# Patient Record
Sex: Male | Born: 1935 | Race: Black or African American | Hispanic: No | Smoking: Never smoker
Health system: Southern US, Community
[De-identification: ages and names within clinical notes are randomized; demographics above are authoritative.]

## PROBLEM LIST (undated history)

## (undated) DIAGNOSIS — R531 Weakness: Secondary | ICD-10-CM

## (undated) DIAGNOSIS — M1990 Unspecified osteoarthritis, unspecified site: Secondary | ICD-10-CM

## (undated) DIAGNOSIS — J189 Pneumonia, unspecified organism: Secondary | ICD-10-CM

## (undated) DIAGNOSIS — R51 Headache: Secondary | ICD-10-CM

## (undated) DIAGNOSIS — D649 Anemia, unspecified: Secondary | ICD-10-CM

## (undated) DIAGNOSIS — I10 Essential (primary) hypertension: Secondary | ICD-10-CM

## (undated) DIAGNOSIS — H6690 Otitis media, unspecified, unspecified ear: Secondary | ICD-10-CM

## (undated) DIAGNOSIS — Z9889 Other specified postprocedural states: Secondary | ICD-10-CM

## (undated) DIAGNOSIS — K5900 Constipation, unspecified: Secondary | ICD-10-CM

## (undated) DIAGNOSIS — R112 Nausea with vomiting, unspecified: Secondary | ICD-10-CM

## (undated) DIAGNOSIS — T8859XA Other complications of anesthesia, initial encounter: Secondary | ICD-10-CM

## (undated) DIAGNOSIS — H409 Unspecified glaucoma: Secondary | ICD-10-CM

## (undated) DIAGNOSIS — T4145XA Adverse effect of unspecified anesthetic, initial encounter: Secondary | ICD-10-CM

## (undated) DIAGNOSIS — Z87442 Personal history of urinary calculi: Secondary | ICD-10-CM

## (undated) DIAGNOSIS — R519 Headache, unspecified: Secondary | ICD-10-CM

## (undated) DIAGNOSIS — R011 Cardiac murmur, unspecified: Secondary | ICD-10-CM

## (undated) HISTORY — PX: OTHER SURGICAL HISTORY: SHX169

## (undated) HISTORY — PX: TONSILLECTOMY: SUR1361

## (undated) HISTORY — DX: Otitis media, unspecified, unspecified ear: H66.90

## (undated) HISTORY — DX: Unspecified osteoarthritis, unspecified site: M19.90

## (undated) HISTORY — DX: Essential (primary) hypertension: I10

## (undated) HISTORY — PX: EYE SURGERY: SHX253

---

## 1997-07-10 ENCOUNTER — Ambulatory Visit (HOSPITAL_COMMUNITY): Admission: RE | Admit: 1997-07-10 | Discharge: 1997-07-10 | Payer: Self-pay | Admitting: Internal Medicine

## 1998-02-27 HISTORY — PX: BACK SURGERY: SHX140

## 1998-03-20 ENCOUNTER — Ambulatory Visit (HOSPITAL_COMMUNITY): Admission: RE | Admit: 1998-03-20 | Discharge: 1998-03-20 | Payer: Self-pay | Admitting: Neurosurgery

## 1998-03-20 ENCOUNTER — Encounter: Payer: Self-pay | Admitting: Neurosurgery

## 1998-05-31 ENCOUNTER — Ambulatory Visit (HOSPITAL_COMMUNITY): Admission: RE | Admit: 1998-05-31 | Discharge: 1998-05-31 | Payer: Self-pay | Admitting: Cardiovascular Disease

## 1998-05-31 ENCOUNTER — Encounter: Payer: Self-pay | Admitting: Cardiovascular Disease

## 1998-06-01 ENCOUNTER — Ambulatory Visit (HOSPITAL_COMMUNITY): Admission: RE | Admit: 1998-06-01 | Discharge: 1998-06-01 | Payer: Self-pay | Admitting: Cardiovascular Disease

## 1998-06-01 ENCOUNTER — Encounter: Payer: Self-pay | Admitting: Neurosurgery

## 1998-06-02 ENCOUNTER — Emergency Department (HOSPITAL_COMMUNITY): Admission: EM | Admit: 1998-06-02 | Discharge: 1998-06-02 | Payer: Self-pay | Admitting: Emergency Medicine

## 1998-07-08 ENCOUNTER — Encounter: Payer: Self-pay | Admitting: Neurosurgery

## 1998-07-08 ENCOUNTER — Inpatient Hospital Stay (HOSPITAL_COMMUNITY): Admission: RE | Admit: 1998-07-08 | Discharge: 1998-07-12 | Payer: Self-pay | Admitting: Neurosurgery

## 2000-03-26 ENCOUNTER — Encounter: Admission: RE | Admit: 2000-03-26 | Discharge: 2000-03-26 | Payer: Self-pay | Admitting: Internal Medicine

## 2000-03-26 ENCOUNTER — Encounter: Payer: Self-pay | Admitting: Internal Medicine

## 2001-01-04 ENCOUNTER — Ambulatory Visit (HOSPITAL_COMMUNITY): Admission: RE | Admit: 2001-01-04 | Discharge: 2001-01-04 | Payer: Self-pay | Admitting: Gastroenterology

## 2003-11-03 ENCOUNTER — Encounter: Admission: RE | Admit: 2003-11-03 | Discharge: 2003-11-03 | Payer: Self-pay | Admitting: Internal Medicine

## 2003-11-04 ENCOUNTER — Emergency Department (HOSPITAL_COMMUNITY): Admission: EM | Admit: 2003-11-04 | Discharge: 2003-11-04 | Payer: Self-pay | Admitting: Emergency Medicine

## 2003-11-19 ENCOUNTER — Encounter: Admission: RE | Admit: 2003-11-19 | Discharge: 2003-11-19 | Payer: Self-pay | Admitting: Internal Medicine

## 2004-01-15 ENCOUNTER — Ambulatory Visit (HOSPITAL_COMMUNITY): Admission: RE | Admit: 2004-01-15 | Discharge: 2004-01-15 | Payer: Self-pay | Admitting: Gastroenterology

## 2006-01-29 ENCOUNTER — Encounter: Admission: RE | Admit: 2006-01-29 | Discharge: 2006-01-29 | Payer: Self-pay | Admitting: Internal Medicine

## 2006-02-15 ENCOUNTER — Encounter: Admission: RE | Admit: 2006-02-15 | Discharge: 2006-02-15 | Payer: Self-pay | Admitting: Internal Medicine

## 2006-03-08 ENCOUNTER — Emergency Department (HOSPITAL_COMMUNITY): Admission: EM | Admit: 2006-03-08 | Discharge: 2006-03-08 | Payer: Self-pay | Admitting: Emergency Medicine

## 2007-11-28 ENCOUNTER — Encounter: Admission: RE | Admit: 2007-11-28 | Discharge: 2007-11-28 | Payer: Self-pay | Admitting: Neurology

## 2007-12-11 ENCOUNTER — Encounter: Admission: RE | Admit: 2007-12-11 | Discharge: 2008-02-27 | Payer: Self-pay | Admitting: Neurology

## 2008-07-07 ENCOUNTER — Encounter: Admission: RE | Admit: 2008-07-07 | Discharge: 2008-07-07 | Payer: Self-pay | Admitting: Internal Medicine

## 2008-10-22 ENCOUNTER — Inpatient Hospital Stay (HOSPITAL_BASED_OUTPATIENT_CLINIC_OR_DEPARTMENT_OTHER): Admission: RE | Admit: 2008-10-22 | Discharge: 2008-10-22 | Payer: Self-pay | Admitting: Cardiovascular Disease

## 2008-11-30 LAB — HM COLONOSCOPY

## 2009-11-19 ENCOUNTER — Encounter: Admission: RE | Admit: 2009-11-19 | Discharge: 2009-11-19 | Payer: Self-pay | Admitting: Internal Medicine

## 2010-07-12 NOTE — Cardiovascular Report (Signed)
NAME:  [name], [name]                ACCOUNT NO.:  [id number]   MEDICAL RECORD NO.:  [id number]          PATIENT TYPE:  OIB   LOCATION:  1963                         FACILITY:  [facility]   PHYSICIAN:  Mohan N. Sharyn Lull, M.D. DATE OF BIRTH:  1935-12-09   DATE OF PROCEDURE:  10/22/2008  DATE OF DISCHARGE:  10/22/2008                            CARDIAC CATHETERIZATION   PROCEDURE:  Left cardiac cath and selective left and right coronary  angiography, LV graphy via right groin using Judkins technique.   INDICATIONS FOR PROCEDURE:  [name] is a 75 year old black male with  past medical history significant for hypertension, benign hypertrophy of  prostate, and glaucoma of the eyes who complains of retrosternal chest  tightness when under stress associated with feeling weak with minimal  exertion associated with diaphoresis.  Denies any palpitation,  lightheadedness, or syncope.  Denies PND, orthopnea, and leg swelling.  EKG done in the office showed sinus bradycardia with LVH with T-wave  inversion in lateral leads.  The patient denies any relation of chest  pain to food, breathing, or movement.   PAST MEDICAL HISTORY:  As above.   PAST SURGICAL HISTORY:  He had back surgery in the past, had  tonsillectomy in the past, had glaucoma surgeries of the eyes and right  wrist surgery.   SOCIAL HISTORY:  He is divorced and has 3 children.  No history of  smoking or alcohol abuse.  He is retired, worked for a [employer] in  the past.   FAMILY HISTORY:  Noncontributory.   MEDICATIONS AT HOME:  He is on:  1. Enteric-coated aspirin 81 mg p.o. daily.  2. Zestoretic 20/25 p.o. daily.  3. Proscar 5 mg p.o. daily.  4. Atenolol 50 mg p.o. daily.  5. Doxazosin 8 mg p.o. daily.  6. Zyrtec 10 mg p.o. daily.   ALLERGIES:  He is allergic to [facility], he has blurring of the vision.   PHYSICAL EXAMINATION:  GENERAL:  He is alert, awake, and oriented x3 in  no acute distress.  VITAL SIGNS:  Blood  pressure was 140/70, pulse was 51 and regular.  HEENT:  Conjunctivae were pink.  NECK:  Supple.  No JVD.  No bruit.  LUNGS:  Clear to auscultation without rhonchi or rales.  CARDIOVASCULAR:  S1 and S2 was normal.  There was a soft systolic  murmur.  There was no S3 or S4 gallop.  ABDOMEN:  Soft.  Bowel sounds present.  Nontender.  EXTREMITIES:  There is no clubbing, cyanosis, or edema.   IMPRESSION:  1. Chest pain and weakness, rule out coronary insufficiency.  2. Hypertension.  3. Benign hypertrophy of prostate.  4. Degenerative joint disease.  5. Hypercholesteremia.  6. History of glaucoma of the eyes.   I discussed with the patient regarding left cath, its risks and benefits  i.e., death, MI, stroke, local vascular complications, etc. and  consented for the left cath.   PROCEDURE:  After obtaining the informed consent, the patient was  brought to the cath lab and was placed on fluoroscopy table.  Right  groin was prepped and draped  in usual fashion.  Xylocaine 1% was used  for local anesthesia in the right groin.  With the help of thin-wall  needle, a 4-French arterial sheath was placed.  The sheath was aspirated  and flushed.  Next, 4-French left Judkins catheter was advanced over the  wire under fluoroscopic guidance up to the ascending aorta.  Wire was  pulled out, the catheter was aspirated, and connected to the manifold.  Catheter was further advanced and engaged into left coronary ostium.  Multiple views of the left system were taken.  Next, catheter was  disengaged and was pulled out over the wire and was replaced with 4-  [place] right 3-D diagnostic catheter which was advanced over the wire  under fluoroscopic guidance into the ascending aorta.  Wire was pulled  out, the catheter was aspirated, and connected to the manifold.  Catheter was further advanced and engaged into right coronary ostium.  Multiple views of the right system were taken.  Next, the catheter was   disengaged and was pulled out over the wire and was replaced with 4-  [place] pigtail catheter which was advanced over the wire under  fluoroscopic guidance into the ascending aorta.  Catheter was further  advanced across the aortic valve into the LV and LV pressures were  recorded.  Next, LV graphy was done in 30-degree RAO position.  Post-  angiographic pressures were recorded from LV and then pullback pressures  were recorded from the aorta.  There was no gradient across the aortic  valve.  Next, the pigtail catheter was pulled out over the wire.  Sheaths were aspirated and flushed.   FINDINGS:  LV showed good LV systolic function, LVH, EF of 04-54%, left  main was long which was patent.  LAD had 20-25% ostial and proximal  stenosis.  Diagonal 1 was small which has 20-30% ostial stenosis.  Diagonal 2 to diagonal 4 were very very small.  Left circumflex with 20-  25% ostial stenosis and then it tapers down in AV groove after giving  off large OM1.  OM1 is large which has 15-20% proximal stenosis.  RCA is  patent.  PDA is very small.  PLV branch is patent.  The patient  tolerated the procedure well.  There were no complications.  The patient  was transferred to recovery room in stable condition.      Eduardo Osier. Sharyn Lull, M.D.  Electronically Signed     MNH/MEDQ  D:  10/22/2008  T:  10/22/2008  Job:  [id number]

## 2010-07-15 NOTE — Procedures (Signed)
[facility]  Patient:    [name], [name] Visit Number: [id number] MRN: [id number]          Service Type: END Location: ENDO Attending Physician:  Orland Mustard Dictated by:   Llana Aliment. Randa Evens, M.D. Proc. Date: 01/04/01 Admit Date:  01/04/2001 Discharge Date: 01/04/2001   CC:         Minerva Areola L. August Saucer, M.D.   Procedure Report  PROCEDURE:  Colonoscopy with removal of polyps.  SURGEON:  James L. Edwards, M.D.  MEDICATIONS:  Fentanyl 87.5 mcg and Versed 8 mg IV.  INDICATIONS:  Strong family history of colon cancer in a brother.  DESCRIPTION OF PROCEDURE:  The procedure had been explained to the patient and consent obtained.  With the patient in the left lateral decubitus position, digital exam was performed.  The adult Olympus video colonoscope was inserted and advanced under direct visualization.  The prep was good. The patient had diffuse melanosis coli throughout the entire colon.  The cecum was reached, and the appendiceal orifice and ileocecal valve were seen.  The scope was withdrawn, and the cecum, ascending colon, hepatic flexure, transverse colon, splenic flexure, descending, and sigmoid colon were seen well upon removal. In the descending colon were two 3-4 mm sessile polyps that were cauterized and placed in a single jar.  No other polyps were seen in the sigmoid colon and rectum.  The scope was withdrawn.  The patient tolerated the procedure well.  ASSESSMENT:  Two polyps in the descending colon.  PLAN: 1. Will recommend repeating in three years. 2. Routine post polypectomy instructions. Dictated by:   Llana Aliment. Randa Evens, M.D. Attending Physician:  Orland Mustard DD:  01/04/01 TD:  01/06/01 Job: [id number]

## 2010-07-15 NOTE — Op Note (Signed)
NAME:  [name], [name]                ACCOUNT NO.:  [id number]   MEDICAL RECORD NO.:  [id number]          PATIENT TYPE:  AMB   LOCATION:  ENDO                         FACILITY:  [facility]   PHYSICIAN:  James L. Malon Kindle., M.D.DATE OF BIRTH:  May 22, 1935   DATE OF PROCEDURE:  01/15/2004  DATE OF DISCHARGE:                                 OPERATIVE REPORT   PROCEDURE:  Colonoscopy.   MEDICATIONS:  Fentanyl 50 mcg, Versed 5 mg IV.   SCOPE:  Olympus pediatric adjustable colonoscope.   INDICATIONS:  Family history of colon cancer in a brother.   DESCRIPTION OF PROCEDURE:  Procedure had been explained to the patient and  consent obtained.  In the left lateral decubitus position, the Olympus scope  was inserted and advanced.  The prep was excellent.  We were able to reach  the cecum without difficulty.  The ileocecal valve and appendiceal orifice  were seen.  The scope was withdrawn to the cecum, ascending colon,  transverse colon, descending and sigmoid colon were seen well and no polyps  or other lesions were seen.  There was no significant diverticular disease.  The scope was withdrawn.  The patient tolerated the procedure well.   ASSESSMENT:  Family history of colon cancer with negative colonoscopy at  this time. V16.0.  Will repeat procedure in 5 years and recommend yearly  Hemoccults.       JLE/MEDQ  D:  01/15/2004  T:  01/16/2004  Job:  [id number]   cc:   Minerva Areola L. August Saucer, M.D.  [address] [address]  [place]  [place] [address]  Fax: [phone]

## 2010-09-26 ENCOUNTER — Ambulatory Visit
Admission: RE | Admit: 2010-09-26 | Discharge: 2010-09-26 | Disposition: A | Discharge status: Home / Self Care | Payer: Medicare Other | Source: Ambulatory Visit | Attending: Internal Medicine | Admitting: Internal Medicine

## 2010-09-26 ENCOUNTER — Other Ambulatory Visit: Payer: Self-pay | Admitting: Internal Medicine

## 2010-09-26 DIAGNOSIS — R059 Cough, unspecified: Secondary | ICD-10-CM

## 2010-09-26 DIAGNOSIS — R05 Cough: Secondary | ICD-10-CM

## 2011-04-18 DIAGNOSIS — H02409 Unspecified ptosis of unspecified eyelid: Secondary | ICD-10-CM | POA: Insufficient documentation

## 2012-03-29 ENCOUNTER — Encounter (HOSPITAL_COMMUNITY): Payer: Self-pay

## 2012-03-29 DIAGNOSIS — H409 Unspecified glaucoma: Secondary | ICD-10-CM

## 2012-03-29 DIAGNOSIS — R0609 Other forms of dyspnea: Secondary | ICD-10-CM

## 2012-03-29 DIAGNOSIS — M1990 Unspecified osteoarthritis, unspecified site: Secondary | ICD-10-CM | POA: Insufficient documentation

## 2012-03-29 DIAGNOSIS — J309 Allergic rhinitis, unspecified: Secondary | ICD-10-CM

## 2012-03-29 DIAGNOSIS — I10 Essential (primary) hypertension: Secondary | ICD-10-CM | POA: Insufficient documentation

## 2012-03-29 DIAGNOSIS — L739 Follicular disorder, unspecified: Secondary | ICD-10-CM | POA: Insufficient documentation

## 2012-05-14 ENCOUNTER — Other Ambulatory Visit (HOSPITAL_COMMUNITY): Payer: Self-pay | Admitting: Cardiology

## 2012-05-14 DIAGNOSIS — R079 Chest pain, unspecified: Secondary | ICD-10-CM

## 2012-05-24 ENCOUNTER — Encounter (HOSPITAL_COMMUNITY)
Admission: RE | Admit: 2012-05-24 | Discharge: 2012-05-24 | Disposition: A | Discharge status: Home / Self Care | Payer: Medicare Other | Source: Ambulatory Visit | Attending: Cardiology | Admitting: Cardiology

## 2012-05-24 ENCOUNTER — Other Ambulatory Visit: Payer: Self-pay

## 2012-05-24 DIAGNOSIS — R079 Chest pain, unspecified: Secondary | ICD-10-CM

## 2012-05-24 MED ORDER — TECHNETIUM TC 99M SESTAMIBI GENERIC - CARDIOLITE
10.0000 | Freq: Once | INTRAVENOUS | Status: AC | PRN
  Administered 2012-05-24: 10 via INTRAVENOUS

## 2012-05-24 MED ORDER — REGADENOSON 0.4 MG/5ML IV SOLN
INTRAVENOUS | Status: AC
  Filled 2012-05-24: qty 5

## 2012-05-24 MED ORDER — TECHNETIUM TC 99M SESTAMIBI GENERIC - CARDIOLITE
30.0000 | Freq: Once | INTRAVENOUS | Status: AC | PRN
  Administered 2012-05-24: 30 via INTRAVENOUS

## 2012-05-24 MED ORDER — REGADENOSON 0.4 MG/5ML IV SOLN
0.4000 mg | Freq: Once | INTRAVENOUS | Status: AC
  Administered 2012-05-24: 0.4 mg via INTRAVENOUS

## 2012-07-18 ENCOUNTER — Encounter (HOSPITAL_COMMUNITY): Payer: Self-pay | Admitting: Pharmacy Technician

## 2012-07-23 ENCOUNTER — Other Ambulatory Visit: Payer: Self-pay | Admitting: Orthopedic Surgery

## 2012-07-23 MED ORDER — BUPIVACAINE LIPOSOME 1.3 % IJ SUSP: 20.0000 mL | Freq: Once | INTRAMUSCULAR | Status: DC

## 2012-07-23 MED ORDER — DEXAMETHASONE SODIUM PHOSPHATE 10 MG/ML IJ SOLN: 10.0000 mg | Freq: Once | INTRAMUSCULAR | Status: DC

## 2012-07-23 NOTE — Progress Notes (Signed)
Preoperative surgical orders have been place into the Epic hospital system for [name] on 07/23/2012, 7:49 AM  by Patrica Duel for surgery on 08/05/2012.  Preop Total Knee orders including Experal, IV Tylenol, and IV Decadron as long as there are no contraindications to the above medications. Avel Peace, PA-C

## 2012-07-29 ENCOUNTER — Ambulatory Visit (HOSPITAL_COMMUNITY)
Admission: RE | Admit: 2012-07-29 | Discharge: 2012-07-29 | Disposition: A | Discharge status: Home / Self Care | Payer: Medicare Other | Source: Ambulatory Visit | Attending: Orthopedic Surgery | Admitting: Orthopedic Surgery

## 2012-07-29 ENCOUNTER — Encounter (HOSPITAL_COMMUNITY): Payer: Self-pay

## 2012-07-29 ENCOUNTER — Encounter (HOSPITAL_COMMUNITY)
Admission: RE | Admit: 2012-07-29 | Discharge: 2012-07-29 | Disposition: A | Discharge status: Home / Self Care | Payer: Medicare Other | Source: Ambulatory Visit | Attending: Orthopedic Surgery | Admitting: Orthopedic Surgery

## 2012-07-29 DIAGNOSIS — Z01812 Encounter for preprocedural laboratory examination: Secondary | ICD-10-CM | POA: Insufficient documentation

## 2012-07-29 DIAGNOSIS — I10 Essential (primary) hypertension: Secondary | ICD-10-CM | POA: Insufficient documentation

## 2012-07-29 DIAGNOSIS — I517 Cardiomegaly: Secondary | ICD-10-CM | POA: Insufficient documentation

## 2012-07-29 HISTORY — DX: Adverse effect of unspecified anesthetic, initial encounter: T41.45XA

## 2012-07-29 HISTORY — DX: Constipation, unspecified: K59.00

## 2012-07-29 HISTORY — DX: Other complications of anesthesia, initial encounter: T88.59XA

## 2012-07-29 HISTORY — DX: Unspecified glaucoma: H40.9

## 2012-07-29 HISTORY — DX: Weakness: R53.1

## 2012-07-29 LAB — COMPREHENSIVE METABOLIC PANEL
AST: 24 U/L (ref 0–37)
BUN: 9 mg/dL (ref 6–23)
CO2: 30 mEq/L (ref 19–32)
Calcium: 9.6 mg/dL (ref 8.4–10.5)
Chloride: 103 mEq/L (ref 96–112)
Creatinine, Ser: 1.09 mg/dL (ref 0.50–1.35)
GFR calc Af Amer: 75 mL/min — ABNORMAL LOW (ref >90)
GFR calc non Af Amer: 64 mL/min — ABNORMAL LOW (ref >90)
Glucose, Bld: 85 mg/dL (ref 70–99)
Total Bilirubin: 0.5 mg/dL (ref 0.3–1.2)

## 2012-07-29 LAB — URINALYSIS, ROUTINE W REFLEX MICROSCOPIC
Glucose, UA: NEGATIVE mg/dL
Hgb urine dipstick: NEGATIVE
Ketones, ur: NEGATIVE mg/dL
Leukocytes, UA: NEGATIVE
pH: 6.5 (ref 5.0–8.0)

## 2012-07-29 LAB — CBC
HCT: 38.7 % — ABNORMAL LOW (ref 39.0–52.0)
MCH: 31.9 pg (ref 26.0–34.0)
MCV: 88.8 fL (ref 78.0–100.0)
RBC: 4.36 MIL/uL (ref 4.22–5.81)
WBC: 4.4 10*3/uL (ref 4.0–10.5)

## 2012-07-29 LAB — PROTIME-INR
INR: 1.2 (ref 0.00–1.49)
Prothrombin Time: 15 seconds (ref 11.6–15.2)

## 2012-07-29 LAB — ABO/RH: ABO/RH(D): B POS

## 2012-07-29 NOTE — Progress Notes (Signed)
07/29/12 1435  OBSTRUCTIVE SLEEP APNEA  Have you ever been diagnosed with sleep apnea through a sleep study? No  Do you snore loudly (loud enough to be heard through closed doors)?  1  Do you often feel tired, fatigued, or sleepy during the daytime? 0  Has anyone observed you stop breathing during your sleep? 0  Do you have, or are you being treated for high blood pressure? 1  BMI more than 35 kg/m2? 0  Age over 75 years old? 1  Neck circumference greater than 40 cm/18 inches? 0  Gender: 1  Obstructive Sleep Apnea Score 4  Score 4 or greater  Results sent to PCP

## 2012-07-29 NOTE — Patient Instructions (Addendum)
[name]  07/29/2012                           YOUR PROCEDURE IS SCHEDULED ON: 08/05/12               PLEASE REPORT TO SHORT STAY CENTER AT :  10:45 AM               CALL THIS NUMBER IF ANY PROBLEMS THE DAY OF SURGERY :               [phone]                      REMEMBER:   Do not eat food or drink liquids AFTER MIDNIGHT  MAY HAVE CLEAR LIQUIDS ONLY FROM 12:00 AM TO 7:45 AM THEN NOTHING  Take these medicines the morning of surgery with A SIP OF WATER:  ATENOLOL / USE EYE DROPS AS YOU NORMALLY USE   Do not wear jewelry, make-up   Do not wear lotions, powders, or perfumes.   Do not shave legs or underarms 12 hrs. before surgery (men may shave face)  Do not bring valuables to the hospital.  Contacts, dentures or bridgework may not be worn into surgery.  Leave suitcase in the car. After surgery it may be brought to your room.  For patients admitted to the hospital more than one night, checkout time is 11:00                          The day of discharge.   Patients discharged the day of surgery will not be allowed to drive home                             If going home same day of surgery, must have someone stay with you first                           24 hrs at home and arrange for some one to drive you home from hospital.    Special Instructions:   Please read over the following fact sheets that you were given:               1. MRSA  INFORMATION                      2. [facility] PREPARING FOR SURGERY SHEET               3. INCENTIVE SPIROMETER                                                X_____________________________________________________________________        Failure to follow these instructions may result in cancellation of your surgery

## 2012-07-30 ENCOUNTER — Other Ambulatory Visit: Payer: Self-pay | Admitting: Orthopedic Surgery

## 2012-07-30 NOTE — Progress Notes (Signed)
Abnormal CXR faxed to Dr. Lequita Halt

## 2012-08-04 ENCOUNTER — Other Ambulatory Visit: Payer: Self-pay | Admitting: Orthopedic Surgery

## 2012-08-04 NOTE — H&P (Signed)
[name]  DOB: April 06, 1935 Widowed / Language: [place] / Race: Black or African American, White Male  Date of Admission:  08/05/2012  Chief Complaint:  Left Knee Pain  History of Present Illness The patient is a 77 year old male who comes in for a preoperative History and Physical. The patient is scheduled for a left total knee arthroplasty to be performed by Dr. Gus Rankin. Aluisio, MD at [facility] on 08/05/2012. The patient is a 77 year old male who presents with knee complaints. The patient reports left knee and right knee symptoms including: instability (shift inward) and weakness which began year(s) ago without any known injury. The patient describes the severity of the symptoms as mild.The patient feels that the symptoms are worsening. and include difficulty ambulating. The patient reports that symptoms radiate to the right hip. Note for "Knee pain": last cortisone injection: 09/13/11 The left knee is getting progressively worse. He said the knees are bowing so badly that the inner thighs are rubbing against each other. He has significant valgus deformities of both knees. He is not having a tremendous amount of pain but the left knee especially is giving out on him a lot. He has had several near falls.  He is ready to proceed with surgery at this time. They have been treated conservatively in the past for the above stated problem and despite conservative measures, they continue to have progressive pain and severe functional limitations and dysfunction. They have failed non-operative management including home exercise, medications, and injections. It is felt that they would benefit from undergoing total joint replacement. Risks and benefits of the procedure have been discussed with the patient and they elect to proceed with surgery. There are no active contraindications to surgery such as ongoing infection or rapidly progressive neurological disease.  Problem  List Osteoarthritis, knee (715.96)   Allergies Hyoscamine *ULCER DRUGS*. causes patient to pass out CeleBREX *ANALGESICS - ANTI-INFLAMMATORY*. causes hypertension Dye FDC Red 40 (Carmine Red) *PHARMACEUTICAL ADJUVANTS* Flomax *GENITOURINARY AGENTS - MISCELLANEOUS* Bextra *ANALGESICS - ANTI-INFLAMMATORY*   Family History  Cancer. sister and brother Diabetes Mellitus. brother Hypertension. sister and brother Bleeding disorder. brother Osteoarthritis. 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 Exercises daily; does other Alcohol use. never consumed alcohol Children. 3 Illicit drug use. no Tobacco / smoke exposure. yes outdoors only Tobacco use. never smoker Living situation. live alone Marital status. widowed Pain Contract. no Post-Surgical Plans. Plan is to go home following the surgery.   Medication History Zestoretic (20-25MG  Tablet, Oral) Active. Lisinopril-Hydrochlorothiazide (20-25MG  Tablet, Oral) Active. Saw Palmetto Extract ( Oral) Specific dose unknown - Active. Timolol Maleate ( Ophthalmic) Specific dose unknown - Active. Durezol (0.05% Emulsion, Ophthalmic) Active. Aspirin EC (81MG  Tablet DR, Oral) Active. Atenolol (50MG  Tablet, Oral) Active. ZyrTEC Allergy (10MG  Tablet, Oral) Active. Vitamin D (1000UNIT Tablet, Oral) Active. Doxazosin Mesylate (8MG  Tablet, Oral) Active. Famciclovir (500MG  Tablet, Oral) Active. Finasteride (5MG  Tablet, Oral) Active. BAC+ Active. (supplement for artery health) EVERY MAN 2 MVI Active. EYE BRIGHT Active. JOINT AID Active. MUSCLE CREATIVE DIMAXX Active. XANGO JUICE Active.   Past Surgical History Cataract Surgery. bilateral Spinal Surgery Eye Surgery  Medical History Impaired Vision Tinnitus Macular Degeneration Glaucoma Cataract Dentures High blood pressure Osteoarthritis   Review of Systems General:Not Present- Chills,  Fever, Night Sweats, Fatigue, Weight Gain, Weight Loss and Memory Loss. Skin:Not Present- Hives, Itching, Rash, Eczema and Lesions. HEENT:Present- Blurred Vision (recent eye surgery) and Dentures. Not Present- Tinnitus, Headache,  Double Vision, Visual Loss and Hearing Loss. Respiratory:Not Present- Shortness of breath with exertion, Shortness of breath at rest, Allergies, Coughing up blood and Chronic Cough. Cardiovascular:Not Present- Chest Pain, Racing/skipping heartbeats, Difficulty Breathing Lying Down, Murmur, Swelling and Palpitations. Gastrointestinal:Not Present- Bloody Stool, Heartburn, Abdominal Pain, Vomiting, Nausea, Constipation, Diarrhea, Difficulty Swallowing, Jaundice and Loss of appetitie. Male Genitourinary:Not Present- Urinary frequency, Blood in Urine, Weak urinary stream, Discharge, Flank Pain, Incontinence, Painful Urination, Urgency, Urinary Retention and Urinating at Night. Musculoskeletal:Present- Muscle Weakness and Joint Pain. Not Present- Muscle Pain, Joint Swelling, Back Pain, Morning Stiffness and Spasms. Neurological:Not Present- Tremor, Dizziness, Blackout spells, Paralysis, Difficulty with balance and Weakness. Psychiatric:Not Present- Insomnia.   Vitals Weight: 222 lb Height: 75 in Weight was reported by patient. Height was reported by patient. Body Surface Area: 2.31 m Body Mass Index: 27.75 kg/m Pulse: 76 (Regular) Resp.: 14 (Unlabored) BP: 152/66 (Sitting, Left Arm, Standard)    Physical Exam The physical exam findings are as follows:  Note: Patient is a 77 year old male with continued knee pain.   General Mental Status - Alert, cooperative and good historian. General Appearance- pleasant. Not in acute distress. Orientation- Oriented X3. Build & Nutrition- Well nourished and Well developed.   Head and Neck Head- normocephalic, atraumatic . Neck Global Assessment- supple. no bruit auscultated on the right and no  bruit auscultated on the left.   Eye Vision- Wears contact lenses (Left eye only). Pupil- Bilateral- Regular and Round. Motion- Bilateral- EOMI.   Note: Left Eye is injected and erythematous from recent surgery.  Chest and Lung Exam Auscultation: Breath sounds:- clear at anterior chest wall and - clear at posterior chest wall. Adventitious sounds:- No Adventitious sounds.   Cardiovascular Auscultation:Rhythm- Regular rate and rhythm. Heart Sounds- S1 WNL and S2 WNL. Murmurs & Other Heart Sounds:Auscultation of the heart reveals - No Murmurs.   Abdomen Palpation/Percussion:Tenderness- Abdomen is non-tender to palpation. Rigidity (guarding)- Abdomen is soft. Auscultation:Auscultation of the abdomen reveals - Bowel sounds normal.   Male Genitourinary Not done, not pertinent to present illness  Musculoskeletal On exam he is alert and oriented in no apparent distress. Evaluation of both knees shows valgus deformity worse on the left than the right. His range of motion on the left is about 5 to 125 with marked crepitus on range of motion. He has about 25 degree valgus deformity while standing.    RADIOGRAPHS: X-rays shows that the arthritis is bone on bone tricompartmental in nature on the left worse in the lateral compartment and he has lateral and patellofemoral disease on the right.  Assessment & Plan Osteoarthritis, knee (715.96) Impression: Left Knee  Note: Plan is for a Left Total Knee Replacement by Dr. Lequita Halt.  Plan is to go home.  The patient does not have any contraindications and will recieve TXA (tranexamic acid) prior to surgery.  PCP - Dr. Willey Blade - Patient has been seen preoperatively and felt to be stable for surgery. Cardiology - Dr. Sharyn Lull - Patient has been seen preoperatively and felt to be stable for surgery.  Signed electronically by Lauraine Rinne, III PA-C

## 2012-08-05 ENCOUNTER — Inpatient Hospital Stay (HOSPITAL_COMMUNITY)
Admission: RE | Admit: 2012-08-05 | Discharge: 2012-08-09 | DRG: 470 | Disposition: A | Discharge status: Home Health | Payer: Medicare Other | Source: Ambulatory Visit | Attending: Orthopedic Surgery | Admitting: Orthopedic Surgery

## 2012-08-05 ENCOUNTER — Encounter (HOSPITAL_COMMUNITY)
Admission: RE | Disposition: A | Discharge status: Home Health | Payer: Self-pay | Source: Ambulatory Visit | Attending: Orthopedic Surgery

## 2012-08-05 ENCOUNTER — Inpatient Hospital Stay (HOSPITAL_COMMUNITY): Payer: Medicare Other | Admitting: Anesthesiology

## 2012-08-05 ENCOUNTER — Encounter (HOSPITAL_COMMUNITY): Payer: Self-pay | Admitting: *Deleted

## 2012-08-05 ENCOUNTER — Encounter (HOSPITAL_COMMUNITY): Payer: Self-pay | Admitting: Anesthesiology

## 2012-08-05 DIAGNOSIS — H409 Unspecified glaucoma: Secondary | ICD-10-CM | POA: Diagnosis present

## 2012-08-05 DIAGNOSIS — M179 Osteoarthritis of knee, unspecified: Secondary | ICD-10-CM | POA: Diagnosis present

## 2012-08-05 DIAGNOSIS — H3530 Unspecified macular degeneration: Secondary | ICD-10-CM | POA: Diagnosis present

## 2012-08-05 DIAGNOSIS — M1710 Unilateral primary osteoarthritis, unspecified knee: Principal | ICD-10-CM | POA: Diagnosis present

## 2012-08-05 DIAGNOSIS — K5900 Constipation, unspecified: Secondary | ICD-10-CM | POA: Diagnosis present

## 2012-08-05 DIAGNOSIS — H9319 Tinnitus, unspecified ear: Secondary | ICD-10-CM | POA: Diagnosis present

## 2012-08-05 DIAGNOSIS — D62 Acute posthemorrhagic anemia: Secondary | ICD-10-CM

## 2012-08-05 DIAGNOSIS — I10 Essential (primary) hypertension: Secondary | ICD-10-CM | POA: Diagnosis present

## 2012-08-05 DIAGNOSIS — Z96652 Presence of left artificial knee joint: Secondary | ICD-10-CM

## 2012-08-05 HISTORY — PX: TOTAL KNEE ARTHROPLASTY: SHX125

## 2012-08-05 SURGERY — ARTHROPLASTY, KNEE, TOTAL
Anesthesia: General | Site: Knee | Laterality: Left | Wound class: Clean

## 2012-08-05 MED ORDER — KETOROLAC TROMETHAMINE 0.4 % OP SOLN
1.0000 [drp] | Freq: Two times a day (BID) | OPHTHALMIC | Status: DC
  Filled 2012-08-05 (×17): qty 0.1

## 2012-08-05 MED ORDER — ATROPINE SULFATE 1 % OP SOLN
1.0000 [drp] | Freq: Two times a day (BID) | OPHTHALMIC | Status: DC
  Administered 2012-08-05 – 2012-08-06 (×3): 1 [drp] via OPHTHALMIC
  Filled 2012-08-05: qty 2

## 2012-08-05 MED ORDER — OXYCODONE HCL 5 MG PO TABS: 5.0000 mg | ORAL_TABLET | Freq: Once | ORAL | Status: DC | PRN

## 2012-08-05 MED ORDER — TRANEXAMIC ACID 100 MG/ML IV SOLN
1000.0000 mg | INTRAVENOUS | Status: AC
  Administered 2012-08-05: 1000 mg via INTRAVENOUS
  Filled 2012-08-05: qty 10

## 2012-08-05 MED ORDER — FLEET ENEMA 7-19 GM/118ML RE ENEM: 1.0000 | ENEMA | Freq: Once | RECTAL | Status: AC | PRN

## 2012-08-05 MED ORDER — METOCLOPRAMIDE HCL 5 MG/ML IJ SOLN
5.0000 mg | Freq: Three times a day (TID) | INTRAMUSCULAR | Status: DC | PRN
  Administered 2012-08-05: 10 mg via INTRAVENOUS
  Filled 2012-08-05: qty 2

## 2012-08-05 MED ORDER — CLONIDINE HCL 0.1 MG PO TABS
0.1000 mg | ORAL_TABLET | Freq: Once | ORAL | Status: AC
  Administered 2012-08-05: 0.1 mg via ORAL
  Filled 2012-08-05: qty 1

## 2012-08-05 MED ORDER — DEXAMETHASONE SODIUM PHOSPHATE 10 MG/ML IJ SOLN
10.0000 mg | Freq: Every day | INTRAMUSCULAR | Status: AC
  Filled 2012-08-05: qty 1

## 2012-08-05 MED ORDER — OXYCODONE HCL 5 MG/5ML PO SOLN: 5.0000 mg | Freq: Once | ORAL | Status: DC | PRN

## 2012-08-05 MED ORDER — DORZOLAMIDE HCL-TIMOLOL MAL 2-0.5 % OP SOLN
1.0000 [drp] | Freq: Two times a day (BID) | OPHTHALMIC | Status: DC
  Filled 2012-08-05: qty 10

## 2012-08-05 MED ORDER — DOCUSATE SODIUM 100 MG PO CAPS
100.0000 mg | ORAL_CAPSULE | Freq: Two times a day (BID) | ORAL | Status: DC
  Administered 2012-08-05 – 2012-08-09 (×8): 100 mg via ORAL

## 2012-08-05 MED ORDER — POLYETHYLENE GLYCOL 3350 17 G PO PACK: 17.0000 g | PACK | Freq: Every day | ORAL | Status: DC | PRN

## 2012-08-05 MED ORDER — GLYCOPYRROLATE 0.2 MG/ML IJ SOLN
INTRAMUSCULAR | Status: DC | PRN
  Administered 2012-08-05: 0.6 mg via INTRAVENOUS

## 2012-08-05 MED ORDER — DIFLUPREDNATE 0.05 % OP EMUL
1.0000 [drp] | Freq: Two times a day (BID) | OPHTHALMIC | Status: DC
  Administered 2012-08-05 – 2012-08-09 (×7): 1 [drp] via OPHTHALMIC

## 2012-08-05 MED ORDER — PROMETHAZINE HCL 25 MG/ML IJ SOLN: 6.2500 mg | INTRAMUSCULAR | Status: DC | PRN

## 2012-08-05 MED ORDER — CHLORHEXIDINE GLUCONATE 4 % EX LIQD
60.0000 mL | Freq: Once | CUTANEOUS | Status: DC
  Filled 2012-08-05: qty 60

## 2012-08-05 MED ORDER — SODIUM CHLORIDE 0.9 % IR SOLN
Status: DC | PRN
  Administered 2012-08-05: 1000 mL

## 2012-08-05 MED ORDER — ONDANSETRON HCL 4 MG/2ML IJ SOLN: 4.0000 mg | Freq: Four times a day (QID) | INTRAMUSCULAR | Status: DC | PRN

## 2012-08-05 MED ORDER — HYDRALAZINE HCL 20 MG/ML IJ SOLN: 5.0000 mg | INTRAMUSCULAR | Status: DC | PRN

## 2012-08-05 MED ORDER — ACETAMINOPHEN 325 MG PO TABS
650.0000 mg | ORAL_TABLET | Freq: Four times a day (QID) | ORAL | Status: DC | PRN
  Administered 2012-08-07 – 2012-08-08 (×2): 650 mg via ORAL
  Filled 2012-08-05 (×2): qty 2

## 2012-08-05 MED ORDER — METHOCARBAMOL 500 MG PO TABS
500.0000 mg | ORAL_TABLET | Freq: Four times a day (QID) | ORAL | Status: DC | PRN
  Administered 2012-08-06: 500 mg via ORAL
  Filled 2012-08-05: qty 1

## 2012-08-05 MED ORDER — ACETAMINOPHEN 650 MG RE SUPP: 650.0000 mg | Freq: Four times a day (QID) | RECTAL | Status: DC | PRN

## 2012-08-05 MED ORDER — ROCURONIUM BROMIDE 100 MG/10ML IV SOLN
INTRAVENOUS | Status: DC | PRN
  Administered 2012-08-05: 30 mg via INTRAVENOUS

## 2012-08-05 MED ORDER — NEOSTIGMINE METHYLSULFATE 1 MG/ML IJ SOLN
INTRAMUSCULAR | Status: DC | PRN
  Administered 2012-08-05: 4 mg via INTRAVENOUS

## 2012-08-05 MED ORDER — MORPHINE SULFATE 2 MG/ML IJ SOLN
1.0000 mg | INTRAMUSCULAR | Status: DC | PRN
  Administered 2012-08-05 – 2012-08-06 (×4): 2 mg via INTRAVENOUS
  Filled 2012-08-05 (×4): qty 1

## 2012-08-05 MED ORDER — ACETAMINOPHEN 10 MG/ML IV SOLN: 1000.0000 mg | Freq: Once | INTRAVENOUS | Status: DC

## 2012-08-05 MED ORDER — ONDANSETRON HCL 4 MG PO TABS: 4.0000 mg | ORAL_TABLET | Freq: Four times a day (QID) | ORAL | Status: DC | PRN

## 2012-08-05 MED ORDER — STERILE WATER FOR IRRIGATION IR SOLN
Status: DC | PRN
  Administered 2012-08-05: 3000 mL

## 2012-08-05 MED ORDER — DOXAZOSIN MESYLATE 8 MG PO TABS
8.0000 mg | ORAL_TABLET | Freq: Every day | ORAL | Status: DC
  Administered 2012-08-05 – 2012-08-08 (×4): 8 mg via ORAL
  Filled 2012-08-05 (×5): qty 1

## 2012-08-05 MED ORDER — BISACODYL 10 MG RE SUPP
10.0000 mg | Freq: Every day | RECTAL | Status: DC | PRN
  Filled 2012-08-05: qty 1

## 2012-08-05 MED ORDER — PROPOFOL 10 MG/ML IV BOLUS
INTRAVENOUS | Status: DC | PRN
  Administered 2012-08-05: 150 mg via INTRAVENOUS

## 2012-08-05 MED ORDER — BIMATOPROST 0.01 % OP SOLN
1.0000 [drp] | Freq: Every day | OPHTHALMIC | Status: DC
  Administered 2012-08-05 – 2012-08-08 (×4): 1 [drp] via OPHTHALMIC
  Filled 2012-08-05: qty 2.5

## 2012-08-05 MED ORDER — BIMATOPROST 0.01 % OP SOLN
1.0000 [drp] | Freq: Every day | OPHTHALMIC | Status: DC
  Filled 2012-08-05: qty 2.5

## 2012-08-05 MED ORDER — METHOCARBAMOL 100 MG/ML IJ SOLN
500.0000 mg | Freq: Four times a day (QID) | INTRAMUSCULAR | Status: DC | PRN
  Filled 2012-08-05: qty 5

## 2012-08-05 MED ORDER — PHENOL 1.4 % MT LIQD
1.0000 | OROMUCOSAL | Status: DC | PRN
  Filled 2012-08-05: qty 177

## 2012-08-05 MED ORDER — PREDNISOLONE ACETATE 1 % OP SUSP
1.0000 [drp] | Freq: Two times a day (BID) | OPHTHALMIC | Status: DC
  Administered 2012-08-06 – 2012-08-09 (×6): 1 [drp] via OPHTHALMIC
  Filled 2012-08-05: qty 1

## 2012-08-05 MED ORDER — KCL IN DEXTROSE-NACL 20-5-0.9 MEQ/L-%-% IV SOLN
INTRAVENOUS | Status: DC
  Administered 2012-08-05 – 2012-08-08 (×4): via INTRAVENOUS
  Filled 2012-08-05 (×7): qty 1000

## 2012-08-05 MED ORDER — TIMOLOL MALEATE 0.5 % OP SOLN
1.0000 [drp] | Freq: Two times a day (BID) | OPHTHALMIC | Status: DC
  Administered 2012-08-06 – 2012-08-07 (×2): 1 [drp] via OPHTHALMIC
  Filled 2012-08-05: qty 5

## 2012-08-05 MED ORDER — MORPHINE SULFATE 2 MG/ML IJ SOLN
INTRAMUSCULAR | Status: AC
  Filled 2012-08-05: qty 1

## 2012-08-05 MED ORDER — HYDRALAZINE HCL 20 MG/ML IJ SOLN
5.0000 mg | Freq: Once | INTRAMUSCULAR | Status: AC
  Administered 2012-08-05: 5 mg via INTRAVENOUS

## 2012-08-05 MED ORDER — SODIUM CHLORIDE 0.9 % IJ SOLN
INTRAMUSCULAR | Status: DC | PRN
  Administered 2012-08-05: 16:00:00

## 2012-08-05 MED ORDER — FENTANYL CITRATE 0.05 MG/ML IJ SOLN
INTRAMUSCULAR | Status: DC | PRN
  Administered 2012-08-05 (×2): 50 ug via INTRAVENOUS
  Administered 2012-08-05: 100 ug via INTRAVENOUS
  Administered 2012-08-05: 50 ug via INTRAVENOUS
  Administered 2012-08-05: 100 ug via INTRAVENOUS

## 2012-08-05 MED ORDER — DORZOLAMIDE HCL 2 % OP SOLN
1.0000 [drp] | Freq: Two times a day (BID) | OPHTHALMIC | Status: DC
  Administered 2012-08-06 (×2): 1 [drp] via OPHTHALMIC
  Filled 2012-08-05: qty 10

## 2012-08-05 MED ORDER — DEXAMETHASONE 6 MG PO TABS
10.0000 mg | ORAL_TABLET | Freq: Every day | ORAL | Status: AC
  Filled 2012-08-05: qty 1

## 2012-08-05 MED ORDER — MENTHOL 3 MG MT LOZG
1.0000 | LOZENGE | OROMUCOSAL | Status: DC | PRN
  Filled 2012-08-05 (×2): qty 9

## 2012-08-05 MED ORDER — HYDROMORPHONE HCL PF 1 MG/ML IJ SOLN
0.2500 mg | INTRAMUSCULAR | Status: DC | PRN
  Administered 2012-08-05: 0.25 mg via INTRAVENOUS

## 2012-08-05 MED ORDER — METOCLOPRAMIDE HCL 10 MG PO TABS: 5.0000 mg | ORAL_TABLET | Freq: Three times a day (TID) | ORAL | Status: DC | PRN

## 2012-08-05 MED ORDER — CEFAZOLIN SODIUM-DEXTROSE 2-3 GM-% IV SOLR
2.0000 g | INTRAVENOUS | Status: AC
  Administered 2012-08-05: 2 g via INTRAVENOUS

## 2012-08-05 MED ORDER — BUPIVACAINE HCL 0.25 % IJ SOLN
INTRAMUSCULAR | Status: DC | PRN
  Administered 2012-08-05: 20 mL

## 2012-08-05 MED ORDER — CEFAZOLIN SODIUM 1-5 GM-% IV SOLN
1.0000 g | Freq: Four times a day (QID) | INTRAVENOUS | Status: AC
  Administered 2012-08-05 – 2012-08-06 (×2): 1 g via INTRAVENOUS
  Filled 2012-08-05 (×2): qty 50

## 2012-08-05 MED ORDER — ACETAMINOPHEN 10 MG/ML IV SOLN: 1000.0000 mg | Freq: Once | INTRAVENOUS | Status: DC | PRN

## 2012-08-05 MED ORDER — FINASTERIDE 5 MG PO TABS
5.0000 mg | ORAL_TABLET | Freq: Every day | ORAL | Status: DC
  Administered 2012-08-05 – 2012-08-08 (×4): 5 mg via ORAL
  Filled 2012-08-05 (×5): qty 1

## 2012-08-05 MED ORDER — BRIMONIDINE TARTRATE 0.2 % OP SOLN
1.0000 [drp] | Freq: Two times a day (BID) | OPHTHALMIC | Status: DC
  Administered 2012-08-06: 1 [drp] via OPHTHALMIC
  Filled 2012-08-05: qty 5

## 2012-08-05 MED ORDER — LOTEPREDNOL ETABONATE 0.5 % OP SUSP
1.0000 [drp] | Freq: Two times a day (BID) | OPHTHALMIC | Status: DC
Start: 2012-08-05 — End: 2012-08-07
  Filled 2012-08-05: qty 5

## 2012-08-05 MED ORDER — TRAMADOL HCL 50 MG PO TABS: 50.0000 mg | ORAL_TABLET | Freq: Four times a day (QID) | ORAL | Status: DC | PRN

## 2012-08-05 MED ORDER — ATENOLOL 50 MG PO TABS
50.0000 mg | ORAL_TABLET | Freq: Two times a day (BID) | ORAL | Status: DC
  Administered 2012-08-05 – 2012-08-09 (×8): 50 mg via ORAL
  Filled 2012-08-05 (×9): qty 1

## 2012-08-05 MED ORDER — RIVAROXABAN 10 MG PO TABS
10.0000 mg | ORAL_TABLET | Freq: Every day | ORAL | Status: DC
  Administered 2012-08-06 – 2012-08-09 (×4): 10 mg via ORAL
  Filled 2012-08-05 (×5): qty 1

## 2012-08-05 MED ORDER — DORZOLAMIDE HCL-TIMOLOL MAL 2-0.5 % OP SOLN: 1.0000 [drp] | Freq: Two times a day (BID) | OPHTHALMIC | Status: DC

## 2012-08-05 MED ORDER — 0.9 % SODIUM CHLORIDE (POUR BTL) OPTIME
TOPICAL | Status: DC | PRN
  Administered 2012-08-05: 1000 mL

## 2012-08-05 MED ORDER — LACTATED RINGERS IV SOLN
INTRAVENOUS | Status: DC
  Administered 2012-08-05: 15:00:00 via INTRAVENOUS
  Administered 2012-08-05: 1000 mL via INTRAVENOUS

## 2012-08-05 MED ORDER — SODIUM CHLORIDE 0.9 % IV SOLN: INTRAVENOUS | Status: DC

## 2012-08-05 MED ORDER — MOXIFLOXACIN HCL 0.5 % OP SOLN
1.0000 [drp] | Freq: Two times a day (BID) | OPHTHALMIC | Status: DC
  Administered 2012-08-05 – 2012-08-06 (×3): 1 [drp] via OPHTHALMIC

## 2012-08-05 MED ORDER — BUPIVACAINE LIPOSOME 1.3 % IJ SUSP
20.0000 mL | Freq: Once | INTRAMUSCULAR | Status: DC
  Filled 2012-08-05: qty 20

## 2012-08-05 MED ORDER — OXYCODONE HCL 5 MG PO TABS
5.0000 mg | ORAL_TABLET | ORAL | Status: DC | PRN
  Administered 2012-08-05 – 2012-08-08 (×9): 10 mg via ORAL
  Administered 2012-08-08 – 2012-08-09 (×3): 5 mg via ORAL
  Filled 2012-08-05 (×4): qty 2
  Filled 2012-08-05: qty 1
  Filled 2012-08-05 (×2): qty 2
  Filled 2012-08-05 (×2): qty 1
  Filled 2012-08-05 (×3): qty 2

## 2012-08-05 MED ORDER — MEPERIDINE HCL 50 MG/ML IJ SOLN: 6.2500 mg | INTRAMUSCULAR | Status: DC | PRN

## 2012-08-05 MED ORDER — DIPHENHYDRAMINE HCL 12.5 MG/5ML PO ELIX: 12.5000 mg | ORAL_SOLUTION | ORAL | Status: DC | PRN

## 2012-08-05 SURGICAL SUPPLY — 59 items
BAG SPEC THK2 15X12 ZIP CLS (MISCELLANEOUS) ×1
BAG ZIPLOCK 12X15 (MISCELLANEOUS) ×2 IMPLANT
BANDAGE ELASTIC 6 VELCRO ST LF (GAUZE/BANDAGES/DRESSINGS) ×2 IMPLANT
BANDAGE ESMARK 6X9 LF (GAUZE/BANDAGES/DRESSINGS) ×1 IMPLANT
BLADE SAG 18X100X1.27 (BLADE) ×2 IMPLANT
BLADE SAW SGTL 11.0X1.19X90.0M (BLADE) ×2 IMPLANT
BNDG CMPR 9X6 STRL LF SNTH (GAUZE/BANDAGES/DRESSINGS) ×1
BNDG ESMARK 6X9 LF (GAUZE/BANDAGES/DRESSINGS) ×2
BOWL SMART MIX CTS (DISPOSABLE) ×2 IMPLANT
CAPT RP KNEE ×1 IMPLANT
CEMENT HV SMART SET (Cement) ×4 IMPLANT
CLOTH BEACON ORANGE TIMEOUT ST (SAFETY) ×2 IMPLANT
CUFF TOURN SGL QUICK 34 (TOURNIQUET CUFF) ×2
CUFF TRNQT CYL 34X4X40X1 (TOURNIQUET CUFF) ×1 IMPLANT
DECANTER SPIKE VIAL GLASS SM (MISCELLANEOUS) ×2 IMPLANT
DRAPE EXTREMITY T 121X128X90 (DRAPE) ×2 IMPLANT
DRAPE POUCH INSTRU U-SHP 10X18 (DRAPES) ×2 IMPLANT
DRAPE U-SHAPE 47X51 STRL (DRAPES) ×2 IMPLANT
DRSG ADAPTIC 3X8 NADH LF (GAUZE/BANDAGES/DRESSINGS) ×2 IMPLANT
DRSG PAD ABDOMINAL 8X10 ST (GAUZE/BANDAGES/DRESSINGS) ×2 IMPLANT
DURAPREP 26ML APPLICATOR (WOUND CARE) ×2 IMPLANT
ELECT REM PT RETURN 9FT ADLT (ELECTROSURGICAL) ×2
ELECTRODE REM PT RTRN 9FT ADLT (ELECTROSURGICAL) ×1 IMPLANT
EVACUATOR 1/8 PVC DRAIN (DRAIN) ×2 IMPLANT
FACESHIELD LNG OPTICON STERILE (SAFETY) ×10 IMPLANT
GLOVE BIO SURGEON STRL SZ7.5 (GLOVE) IMPLANT
GLOVE BIO SURGEON STRL SZ8 (GLOVE) ×2 IMPLANT
GLOVE BIOGEL PI IND STRL 8 (GLOVE) ×1 IMPLANT
GLOVE BIOGEL PI INDICATOR 8 (GLOVE) ×1
GLOVE SURG SS PI 6.5 STRL IVOR (GLOVE) IMPLANT
GOWN STRL NON-REIN LRG LVL3 (GOWN DISPOSABLE) ×2 IMPLANT
GOWN STRL REIN XL XLG (GOWN DISPOSABLE) IMPLANT
HANDPIECE INTERPULSE COAX TIP (DISPOSABLE) ×2
IMMOBILIZER KNEE 20 (SOFTGOODS) ×2
IMMOBILIZER KNEE 20 THIGH 36 (SOFTGOODS) ×1 IMPLANT
KIT BASIN OR (CUSTOM PROCEDURE TRAY) ×2 IMPLANT
MANIFOLD NEPTUNE II (INSTRUMENTS) ×2 IMPLANT
NDL SAFETY ECLIPSE 18X1.5 (NEEDLE) ×2 IMPLANT
NEEDLE HYPO 18GX1.5 SHARP (NEEDLE) ×4
NS IRRIG 1000ML POUR BTL (IV SOLUTION) ×2 IMPLANT
PACK TOTAL JOINT (CUSTOM PROCEDURE TRAY) ×2 IMPLANT
PADDING CAST COTTON 6X4 STRL (CAST SUPPLIES) ×4 IMPLANT
PATELLA DOME PFC 41MM (Knees) ×1 IMPLANT
POSITIONER SURGICAL ARM (MISCELLANEOUS) ×2 IMPLANT
SET HNDPC FAN SPRY TIP SCT (DISPOSABLE) ×1 IMPLANT
SPONGE GAUZE 4X4 12PLY (GAUZE/BANDAGES/DRESSINGS) ×2 IMPLANT
STRIP CLOSURE SKIN 1/2X4 (GAUZE/BANDAGES/DRESSINGS) ×3 IMPLANT
SUCTION FRAZIER 12FR DISP (SUCTIONS) ×2 IMPLANT
SUT MNCRL AB 4-0 PS2 18 (SUTURE) ×2 IMPLANT
SUT PDS AB 1 CT1 27 (SUTURE) ×1 IMPLANT
SUT VIC AB 2-0 CT1 27 (SUTURE) ×6
SUT VIC AB 2-0 CT1 TAPERPNT 27 (SUTURE) ×3 IMPLANT
SUT VLOC 180 0 24IN GS25 (SUTURE) ×2 IMPLANT
SYR 20CC LL (SYRINGE) ×2 IMPLANT
SYR 50ML LL SCALE MARK (SYRINGE) ×2 IMPLANT
TOWEL OR 17X26 10 PK STRL BLUE (TOWEL DISPOSABLE) ×4 IMPLANT
TRAY FOLEY CATH 14FRSI W/METER (CATHETERS) ×2 IMPLANT
WATER STERILE IRR 1500ML POUR (IV SOLUTION) ×4 IMPLANT
WRAP KNEE MAXI GEL POST OP (GAUZE/BANDAGES/DRESSINGS) ×2 IMPLANT

## 2012-08-05 NOTE — Progress Notes (Signed)
08/05/12 2240 Nursing  Avel Peace PA called reg bp in 180's this evening on postop vss. Order received for clonidine 0.1 mg x 1 dose

## 2012-08-05 NOTE — Interval H&P Note (Signed)
History and Physical Interval Note:  08/05/2012 12:56 PM  [name]  has presented today for surgery, with the diagnosis of OA OF LEFT KNEE  The various methods of treatment have been discussed with the patient and family. After consideration of risks, benefits and other options for treatment, the patient has consented to  Procedure(s): LEFT TOTAL KNEE ARTHROPLASTY (Left) as a surgical intervention .  The patient's history has been reviewed, patient examined, no change in status, stable for surgery.  I have reviewed the patient's chart and labs.  Questions were answered to the patient's satisfaction.     Loanne Drilling

## 2012-08-05 NOTE — Anesthesia Postprocedure Evaluation (Signed)
Anesthesia Post Note  Patient: [name]  Procedure(s) Performed: Procedure(s) (LRB): LEFT TOTAL KNEE ARTHROPLASTY (Left)  Anesthesia type: General  Patient location: PACU  Post pain: Pain level controlled  Post assessment: Post-op Vital signs reviewed  Last Vitals: BP 171/75  Pulse 60  Temp(Src) 36.7 C (Oral)  Resp 14  SpO2 98%  Post vital signs: Reviewed  Level of consciousness: sedated  Complications: No apparent anesthesia complications

## 2012-08-05 NOTE — Transfer of Care (Signed)
Immediate Anesthesia Transfer of Care Note  Patient: [name]  Procedure(s) Performed: Procedure(s): LEFT TOTAL KNEE ARTHROPLASTY (Left)  Patient Location: PACU  Anesthesia Type:General  Level of Consciousness: awake, alert  and oriented  Airway & Oxygen Therapy: Patient Spontanous Breathing and Patient connected to face mask oxygen  Post-op Assessment: Report given to PACU RN and Post -op Vital signs reviewed and stable  Post vital signs: Reviewed and stable  Complications: No apparent anesthesia complications

## 2012-08-05 NOTE — Plan of Care (Signed)
Problem: Consults Goal: Diagnosis- Total Joint Replacement Outcome: Progressing Primary Total Knee     

## 2012-08-05 NOTE — Op Note (Signed)
Pre-operative diagnosis- Osteoarthritis Left knee(s)  Post-operative diagnosis- Osteoarthritis  Left knee(s)  Procedure-   Left Total Knee Arthroplasty  Surgeon- Gus Rankin. Haani Bakula, MD  Assistant- Avel Peace, PA-C   Anesthesia-  General   EBL- * No blood loss amount entered *   Drains Hemovac   Tourniquet time  Total Tourniquet Time Documented: Thigh (Left) - 49 minutes Total: Thigh (Left) - 49 minutes    Complications- None  Condition-PACU - hemodynamically stable.   Brief Clinical Note   [name] is a 77 y.o. year old male with end stage OA of his left knee with progressively worsening pain and dysfunction. He has constant pain, with activity and at rest and significant functional deficits with difficulties even with ADLs. He has had extensive non-op management including analgesics, injections of cortisone and viscosupplements, and home exercise program, but remains in significant pain with significant dysfunction. Radiographs show bone on bone arthritis lateral and patellofemoral with valgus deformity. He presents now for left Total Knee Arthroplasty.    Procedure in detail---       The patient is brought into the operating room and positioned supine on the operating table. After successful administration of General anesthetic, a tourniquet is placed high on the Left thigh(s) and the lower extremity is prepped and draped in the usual sterile fashion. Time out is performed by the operating team and then the Left  lower extremity is wrapped in Esmarch, knee flexed and the tourniquet inflated to 300 mmHg.       A midline incision is made with a ten blade through the subcutaneous tissue to the level of the extensor mechanism. A fresh blade is used to make a lateral parapatellar arthrotomy due to the patients' valgus deformity. Soft tissue over the proximal lateral tibia is subperiosteally elevated to the joint line with a knife to the posterolateral corner but not including the  structures of the posterolateral corner. Soft tissue over the proximal medial tibia is elevated with attention being paid to avoiding the patellar tendon on the tibial tubercle. The patella is everted medially, knee flexed 90 degrees and the ACL and PCL are removed. Findings are bone on bone lateral and patellofemoral with massive global osteophytes. .       The drill is used to create a starting hole in the distal femur and the canal is thoroughly irrigated with sterile saline to remove the fatty contents. The 5 degree Left  valgus alignment guide is placed into the femoral canal and the distal femoral cutting block is pinned to remove 10  mm off the distal femur. Resection is made with an oscillating saw.      The tibia is subluxed forward and the menisci are removed. The extramedullary alignment guide is placed referencing proximally at the medial aspect of the tibial tubercle and distally along the second metatarsal axis and tibial crest. The block is pinned to remove 2mm off the more deficient lateral side. Resection is made with an oscillating saw. Size 5  is the most appropriate size for the tibia and the proximal tibia is prepared with the modular drill and keel punch for that size.      The femoral sizing guide is placed and size 6  is most appropriate. Rotation is marked off the epicondylar axis and confirmed by creating a rectangular flexion gap at 90 degrees. The size 6  cutting block is pinned in this rotation and the anterior, posterior and chamfer cuts are made with the oscillating  saw. The intercondylar block is then placed and that cut is made.      Trial size 5  tibial component, trial size 6  posterior stabilized femur and a 15  mm posterior stabilized rotating platform insert trial is placed. Full extension is achieved with excellent varus/valgus and   anterior/posterior balance throughout full range of motion. The patella is everted and thickness measured to be 27  mm. Free hand resection is  taken to 15 mm, a 41 template is placed, lug holes are drilled, trial patella is placed, and it tracks normally. Osteophytes are removed off the posterior femur with the trial in place. All trials are removed and the cut bone surfaces prepared with pulsatile lavage. Cement is mixed and once ready for implantation, the size 5  tibial implant, size 6 posterior stabilized femoral component, and the size 41  patella are cemented in place and the patella is held with the clamp. The trial insert is placed and the knee held in full extension.The Exparel (20 ml mixed with 30 ml saline) and 20 ml .25% Marcaine are injected into the extensor mechanism, posterior capsule, medial and lateral gutters and subcutaneous tissues. All extruded cement is removed and once the cement is hard the permanent 15  mm posterior stabilized rotating platform insert is placed into the tibial tray.      The wound is copiously irrigated with saline solution and the tourniquet is released for a total   tourniquet time of 48  minutes. Bleeding is identified and controlled with electrocautery. The extensor mechanism is closed with interrupted #1 PDS leaving open a small area from the superior to inferior pole of the patella to serve as a mini lateral release. Flexion against gravity is 135  degrees and the patella tracks normally. Subcutaneous tissue is closed with 2.0 vicryl and subcuticular with running 4.0 Monocryl.The incision is cleaned and dried and steri-strips and a bulky sterile dressing are applied. The limb is placed into a knee immobilizer and the patient is awakened and transported to recovery in stable condition.      Please note that a surgical assistant was a medical necessity for this procedure in order to perform it in a safe and expeditious manner. Surgical assistant was necessary to retract the ligaments and vital neurovascular structures to prevent injury to them and also necessary for proper positioning of the limb to allow  for anatomic placement of the prosthesis.    Gus Rankin [name], MD    08/05/2012, 3:58 PM

## 2012-08-05 NOTE — Anesthesia Preprocedure Evaluation (Addendum)
Anesthesia Evaluation  Patient identified by MRN, date of birth, ID band Patient awake    Reviewed: Allergy & Precautions, H&P , NPO status , Patient's Chart, lab work & pertinent test results  History of Anesthesia Complications Negative for: history of anesthetic complications  Airway Mallampati: II TM Distance: >3 FB Neck ROM: Full    Dental  (+) Dental Advisory Given and Teeth Intact   Pulmonary shortness of breath and with exertion,  breath sounds clear to auscultation        Cardiovascular hypertension, Rhythm:Regular Rate:Normal     Neuro/Psych negative neurological ROS  negative psych ROS   GI/Hepatic negative GI ROS, Neg liver ROS,   Endo/Other  negative endocrine ROS  Renal/GU negative Renal ROS     Musculoskeletal negative musculoskeletal ROS (+)   Abdominal   Peds  Hematology negative hematology ROS (+)   Anesthesia Other Findings   Reproductive/Obstetrics                         Anesthesia Physical Anesthesia Plan  ASA: II  Anesthesia Plan: General   Post-op Pain Management:    Induction: Intravenous  Airway Management Planned: Oral ETT  Additional Equipment:   Intra-op Plan:   Post-operative Plan: Extubation in OR  Informed Consent: I have reviewed the patients History and Physical, chart, labs and discussed the procedure including the risks, benefits and alternatives for the proposed anesthesia with the patient or authorized representative who has indicated his/her understanding and acceptance.   Dental advisory given  Plan Discussed with: CRNA  Anesthesia Plan Comments:        Anesthesia Quick Evaluation

## 2012-08-05 NOTE — H&P (View-Only) (Signed)
[name]  DOB: 03/24/1935 Widowed / Language: English / Race: Black or African American, White Male  Date of Admission:  08/05/2012  Chief Complaint:  Left Knee Pain  History of Present Illness The patient is a 77 year old male who comes in for a preoperative History and Physical. The patient is scheduled for a left total knee arthroplasty to be performed by Dr. Frank V. Aluisio, MD at  Hospital on 08/05/2012. The patient is a 76 year old male who presents with knee complaints. The patient reports left knee and right knee symptoms including: instability (shift inward) and weakness which began year(s) ago without any known injury. The patient describes the severity of the symptoms as mild.The patient feels that the symptoms are worsening. and include difficulty ambulating. The patient reports that symptoms radiate to the right hip. Note for "Knee pain": last cortisone injection: 09/13/11 The left knee is getting progressively worse. He said the knees are bowing so badly that the inner thighs are rubbing against each other. He has significant valgus deformities of both knees. He is not having a tremendous amount of pain but the left knee especially is giving out on him a lot. He has had several near falls.  He is ready to proceed with surgery at this time. They have been treated conservatively in the past for the above stated problem and despite conservative measures, they continue to have progressive pain and severe functional limitations and dysfunction. They have failed non-operative management including home exercise, medications, and injections. It is felt that they would benefit from undergoing total joint replacement. Risks and benefits of the procedure have been discussed with the patient and they elect to proceed with surgery. There are no active contraindications to surgery such as ongoing infection or rapidly progressive neurological disease.  Problem  List Osteoarthritis, knee (715.96)   Allergies Hyoscamine *ULCER DRUGS*. causes patient to pass out CeleBREX *ANALGESICS - ANTI-INFLAMMATORY*. causes hypertension Dye FDC Red 40 (Carmine Red) *PHARMACEUTICAL ADJUVANTS* Flomax *GENITOURINARY AGENTS - MISCELLANEOUS* Bextra *ANALGESICS - ANTI-INFLAMMATORY*   Family History  Cancer. sister and brother Diabetes Mellitus. brother Hypertension. sister and brother Bleeding disorder. brother Osteoarthritis. mother   Social History Current work status. retired Drug/Alcohol Rehab (Previously). no Exercise. Exercises daily; does other Alcohol use. never consumed alcohol Children. 3 Illicit drug use. no Tobacco / smoke exposure. yes outdoors only Tobacco use. never smoker Living situation. live alone Marital status. widowed Pain Contract. no Post-Surgical Plans. Plan is to go home following the surgery.   Medication History Zestoretic (20-25MG Tablet, Oral) Active. Lisinopril-Hydrochlorothiazide (20-25MG Tablet, Oral) Active. Saw Palmetto Extract ( Oral) Specific dose unknown - Active. Timolol Maleate ( Ophthalmic) Specific dose unknown - Active. Durezol (0.05% Emulsion, Ophthalmic) Active. Aspirin EC (81MG Tablet DR, Oral) Active. Atenolol (50MG Tablet, Oral) Active. ZyrTEC Allergy (10MG Tablet, Oral) Active. Vitamin D (1000UNIT Tablet, Oral) Active. Doxazosin Mesylate (8MG Tablet, Oral) Active. Famciclovir (500MG Tablet, Oral) Active. Finasteride (5MG Tablet, Oral) Active. BAC+ Active. (supplement for artery health) EVERY MAN 2 MVI Active. EYE BRIGHT Active. JOINT AID Active. MUSCLE CREATIVE DIMAXX Active. XANGO JUICE Active.   Past Surgical History Cataract Surgery. bilateral Spinal Surgery Eye Surgery  Medical History Impaired Vision Tinnitus Macular Degeneration Glaucoma Cataract Dentures High blood pressure Osteoarthritis   Review of Systems General:Not Present- Chills,  Fever, Night Sweats, Fatigue, Weight Gain, Weight Loss and Memory Loss. Skin:Not Present- Hives, Itching, Rash, Eczema and Lesions. HEENT:Present- Blurred Vision (recent eye surgery) and Dentures. Not Present- Tinnitus, Headache,   Double Vision, Visual Loss and Hearing Loss. Respiratory:Not Present- Shortness of breath with exertion, Shortness of breath at rest, Allergies, Coughing up blood and Chronic Cough. Cardiovascular:Not Present- Chest Pain, Racing/skipping heartbeats, Difficulty Breathing Lying Down, Murmur, Swelling and Palpitations. Gastrointestinal:Not Present- Bloody Stool, Heartburn, Abdominal Pain, Vomiting, Nausea, Constipation, Diarrhea, Difficulty Swallowing, Jaundice and Loss of appetitie. Male Genitourinary:Not Present- Urinary frequency, Blood in Urine, Weak urinary stream, Discharge, Flank Pain, Incontinence, Painful Urination, Urgency, Urinary Retention and Urinating at Night. Musculoskeletal:Present- Muscle Weakness and Joint Pain. Not Present- Muscle Pain, Joint Swelling, Back Pain, Morning Stiffness and Spasms. Neurological:Not Present- Tremor, Dizziness, Blackout spells, Paralysis, Difficulty with balance and Weakness. Psychiatric:Not Present- Insomnia.   Vitals Weight: 222 lb Height: 75 in Weight was reported by patient. Height was reported by patient. Body Surface Area: 2.31 m Body Mass Index: 27.75 kg/m Pulse: 76 (Regular) Resp.: 14 (Unlabored) BP: 152/66 (Sitting, Left Arm, Standard)    Physical Exam The physical exam findings are as follows:  Note: Patient is a 76 year old male with continued knee pain.   General Mental Status - Alert, cooperative and good historian. General Appearance- pleasant. Not in acute distress. Orientation- Oriented X3. Build & Nutrition- Well nourished and Well developed.   Head and Neck Head- normocephalic, atraumatic . Neck Global Assessment- supple. no bruit auscultated on the right and no  bruit auscultated on the left.   Eye Vision- Wears contact lenses (Left eye only). Pupil- Bilateral- Regular and Round. Motion- Bilateral- EOMI.   Note: Left Eye is injected and erythematous from recent surgery.  Chest and Lung Exam Auscultation: Breath sounds:- clear at anterior chest wall and - clear at posterior chest wall. Adventitious sounds:- No Adventitious sounds.   Cardiovascular Auscultation:Rhythm- Regular rate and rhythm. Heart Sounds- S1 WNL and S2 WNL. Murmurs & Other Heart Sounds:Auscultation of the heart reveals - No Murmurs.   Abdomen Palpation/Percussion:Tenderness- Abdomen is non-tender to palpation. Rigidity (guarding)- Abdomen is soft. Auscultation:Auscultation of the abdomen reveals - Bowel sounds normal.   Male Genitourinary Not done, not pertinent to present illness  Musculoskeletal On exam he is alert and oriented in no apparent distress. Evaluation of both knees shows valgus deformity worse on the left than the right. His range of motion on the left is about 5 to 125 with marked crepitus on range of motion. He has about 25 degree valgus deformity while standing.    RADIOGRAPHS: X-rays shows that the arthritis is bone on bone tricompartmental in nature on the left worse in the lateral compartment and he has lateral and patellofemoral disease on the right.  Assessment & Plan Osteoarthritis, knee (715.96) Impression: Left Knee  Note: Plan is for a Left Total Knee Replacement by Dr. Aluisio.  Plan is to go home.  The patient does not have any contraindications and will recieve TXA (tranexamic acid) prior to surgery.  PCP - Dr. Eric Dean - Patient has been seen preoperatively and felt to be stable for surgery. Cardiology - Dr. Harwani - Patient has been seen preoperatively and felt to be stable for surgery.  Signed electronically by Alexzandrew L Perkins, III PA-C 

## 2012-08-06 ENCOUNTER — Encounter (HOSPITAL_COMMUNITY): Payer: Self-pay | Admitting: Orthopedic Surgery

## 2012-08-06 DIAGNOSIS — D62 Acute posthemorrhagic anemia: Secondary | ICD-10-CM

## 2012-08-06 LAB — BASIC METABOLIC PANEL
CO2: 27 mEq/L (ref 19–32)
Chloride: 101 mEq/L (ref 96–112)
Glucose, Bld: 161 mg/dL — ABNORMAL HIGH (ref 70–99)
Potassium: 3.6 mEq/L (ref 3.5–5.1)
Sodium: 135 mEq/L (ref 135–145)

## 2012-08-06 LAB — CBC
Hemoglobin: 11.8 g/dL — ABNORMAL LOW (ref 13.0–17.0)
MCH: 31.5 pg (ref 26.0–34.0)
RBC: 3.75 MIL/uL — ABNORMAL LOW (ref 4.22–5.81)
WBC: 7.5 10*3/uL (ref 4.0–10.5)

## 2012-08-06 MED ORDER — CLONIDINE HCL 0.1 MG PO TABS
0.1000 mg | ORAL_TABLET | Freq: Two times a day (BID) | ORAL | Status: DC | PRN
  Filled 2012-08-06: qty 1

## 2012-08-06 MED ORDER — SODIUM CHLORIDE 0.9 % IV BOLUS (SEPSIS)
250.0000 mL | Freq: Once | INTRAVENOUS | Status: AC
  Administered 2012-08-06: 250 mL via INTRAVENOUS

## 2012-08-06 NOTE — Progress Notes (Signed)
   Subjective: 1 Day Post-Op Procedure(s) (LRB): LEFT TOTAL KNEE ARTHROPLASTY (Left) Patient reports pain as mild and moderate.   Family in room.  He states that he has a history of difficulty urinating. Patient seen in rounds with Dr. Lequita Halt. Patient is well, and has had no acute complaints or problems We will start therapy today.  Plan is to go Home after hospital stay.  Objective: Vital signs in last 24 hours: Temp:  [97 F (36.1 C)-100.3 F (37.9 C)] 100.3 F (37.9 C) (06/10 9147) Pulse Rate:  [51-68] 68 (06/10 0623) Resp:  [5-20] 18 (06/10 0623) BP: (130-192)/(68-82) 130/68 mmHg (06/10 0623) SpO2:  [98 %-100 %] 100 % (06/10 0623) Weight:  [101.152 kg (223 lb)] 101.152 kg (223 lb) (06/09 1757)  Intake/Output from previous day:  Intake/Output Summary (Last 24 hours) at 08/06/12 0750 Last data filed at 08/06/12 8295  Gross per 24 hour  Intake   3330 ml  Output   1000 ml  Net   2330 ml    Intake/Output this shift:    Labs:  Recent Labs  08/06/12 0415  HGB 11.8*    Recent Labs  08/06/12 0415  WBC 7.5  RBC 3.75*  HCT 33.2*  PLT 121*    Recent Labs  08/06/12 0415  NA 135  K 3.6  CL 101  CO2 27  BUN 10  CREATININE 1.05  GLUCOSE 161*  CALCIUM 8.4   No results found for this basename: LABPT, INR,  in the last 72 hours  EXAM General - Patient is Alert, Appropriate and Oriented12 Extremity - Neurovascular intact Sensation intact distally Dorsiflexion/Plantar flexion intact Dressing - dressing C/D/I Motor Function - intact, moving foot and toes well on exam.  Hemovac pulled without difficulty.  Past Medical History  Diagnosis Date  . HTN (hypertension)   . Otitis media     with intermittent vertigo  . DJD (degenerative joint disease)   . Glaucoma   . Complication of anesthesia     HARD TO WAKE UP  . Weakness     of neck muscles  . Constipation     Assessment/Plan: 1 Day Post-Op Procedure(s) (LRB): LEFT TOTAL KNEE ARTHROPLASTY (Left) -  Lateral Approach Principal Problem:   OA (osteoarthritis) of knee Active Problems:   Postoperative anemia due to acute blood loss  Estimated body mass index is 27.87 kg/(m^2) as calculated from the following:   Height as of this encounter: 6\' 3"  (1.905 m).   Weight as of this encounter: 101.152 kg (223 lb). Advance diet Up with therapy Plan for discharge tomorrow Discharge home with home health  DVT Prophylaxis - Xarelto Weight-Bearing as tolerated to left leg No vaccines. D/C O2 and Pulse OX and try on Room Air  PERKINS, Marlowe Sax 08/06/2012, 7:50 AM

## 2012-08-06 NOTE — Progress Notes (Signed)
UR COMPLETED  

## 2012-08-06 NOTE — Progress Notes (Signed)
Delivered commode to patients room.

## 2012-08-06 NOTE — Progress Notes (Signed)
08/06/12 1200  PT Visit Information  Last PT Received On 08/06/12  Assistance Needed +2  PT Time Calculation  PT Start Time 1151  PT Stop Time 1215  PT Time Calculation (min) 24 min  Subjective Data  Patient Stated Goal home  Precautions  Precautions Knee  Required Braces or Orthoses Knee Immobilizer - Left  Knee Immobilizer - Left Discontinue once straight leg raise with < 10 degree lag  Restrictions  LLE Weight Bearing WBAT  Cognition  Arousal/Alertness Lethargic  Behavior During Therapy WFL for tasks assessed/performed  Overall Cognitive Status Within Functional Limits for tasks assessed  Bed Mobility  Bed Mobility Sit to Supine  Sit to Supine 1: +2 Total assist  Sit to Supine: Patient Percentage 70%  Details for Bed Mobility Assistance cues for technique;  Transfers  Transfers Sit to Stand;Stand to Sit;Stand Pivot Transfers  Sit to Stand 1: +2 Total assist;With upper extremity assist  Sit to Stand: Patient Percentage 60%  Stand to Sit 1: +2 Total assist;With upper extremity assist  Stand to Sit: Patient Percentage 60%  Stand Pivot Transfers 1: +2 Total assist  Stand Pivot Transfers: Patient Percentage 60%  Details for Transfer Assistance +2 for wt shift, safety, balance; cues for hand placement and LLE management  Ambulation/Gait  Ambulation/Gait Assistance 1: +2 Total assist  Ambulation/Gait: Patient Percentage 60%  Ambulation Distance (Feet) 5 Feet  Assistive device Rolling walker  Ambulation/Gait Assistance Details cues for sequence  Gait Pattern Step-to pattern  Static Sitting Balance  Static Sitting - Balance Support Bilateral upper extremity supported;Feet supported  Static Sitting - Level of Assistance 3: Mod assist;5: Stand by assistance  Total Joint Exercises  Ankle Circles/Pumps AROM;Both;10 reps  Quad Sets AROM;Both;10 reps  Heel Slides AAROM;PROM;Left;10 reps  Goniometric ROM 70 degrees flexion  PT - End of Session  Equipment Utilized During  Treatment Gait belt;Left knee immobilizer  Activity Tolerance Patient limited by fatigue  Patient left with call bell/phone within reach;with family/visitor present;in bed  Nurse Communication Mobility status  PT - Assessment/Plan  Comments on Treatment Session pt follows commands but continues to be slightly lethargic; If pt does not progress, may need to consider STSNF  PT Plan Discharge plan remains appropriate;Frequency remains appropriate  PT Frequency 7X/week  Follow Up Recommendations Home health PT;Supervision for mobility/OOB  PT equipment None recommended by PT;Other (comment) (3in1)  Acute Rehab PT Goals  Time For Goal Achievement 08/13/12  Potential to Achieve Goals Good  Pt will go Supine/Side to Sit with supervision  PT Goal: Supine/Side to Sit - Progress Progressing toward goal  Pt will go Sit to Stand with supervision  PT Goal: Sit to Stand - Progress Progressing toward goal  Pt will Ambulate 51 - 150 feet;with supervision;with rolling walker  PT Goal: Ambulate - Progress Progressing toward goal  PT General Charges  $$ ACUTE PT VISIT 1 Procedure  PT Treatments  $Therapeutic Exercise 8-22 mins  $Therapeutic Activity 8-22 mins

## 2012-08-06 NOTE — Evaluation (Signed)
Physical Therapy Evaluation Patient Details Name: [name] MRN: [id number] DOB: [birth year] Today's Date: 08/06/2012 Time: 4098 (+ [phone] total minutes)-1014 PT Time Calculation (min): 32 min  PT Assessment / Plan / Recommendation Clinical Impression  s/p L TKA and will benefit from PT in acute setting due to deficits below; Plan is to go home with support of daughters; Pt activity limited by pain/nausea/dizziness this am    PT Assessment  Patient needs continued PT services    Follow Up Recommendations  Home health PT;Supervision for mobility/OOB    Does the patient have the potential to tolerate intense rehabilitation      Barriers to Discharge        Equipment Recommendations  None recommended by PT;Other (comment) (3in1)    Recommendations for Other Services     Frequency 7X/week    Precautions / Restrictions Precautions Precautions: Knee Restrictions LLE Weight Bearing: Weight bearing as tolerated   Pertinent Vitals/Pain Left knee 8/10      Mobility  Bed Mobility Bed Mobility: Supine to Sit;Sitting - Scoot to Edge of Bed Supine to Sit: 4: Min assist;3: Mod assist Sitting - Scoot to Edge of Bed: 4: Min assist Details for Bed Mobility Assistance: cues for technique; Transfers Transfers: Sit to Stand;Stand to [employer] Transfers Sit to Stand: 1: +2 Total assist;From bed;From elevated surface Sit to Stand: Patient Percentage: 70% Stand to Sit: 1: +2 Total assist Stand to Sit: Patient Percentage: 60% Stand Pivot Transfers: 1: +2 Total assist Stand Pivot Transfers: Patient Percentage: 60% Details for Transfer Assistance: +2 for wt shift, safety, balance; cues for hadn placement and LLE management Ambulation/Gait Ambulation/Gait Assistance: Not tested (comment)    Exercises Total Joint Exercises Ankle Circles/Pumps: AROM;Both;10 reps   PT Diagnosis: Difficulty walking  PT Problem List: Decreased strength;Decreased range of motion;Decreased  activity tolerance;Decreased balance;Decreased knowledge of precautions;Decreased knowledge of use of DME PT Treatment Interventions: DME instruction;Gait training;Functional mobility training;Therapeutic activities;Therapeutic exercise;Balance training;Patient/family education   PT Goals Acute Rehab PT Goals PT Goal Formulation: With patient Time For Goal Achievement: 08/13/12 Potential to Achieve Goals: Good Pt will go Supine/Side to Sit: with supervision PT Goal: Supine/Side to Sit - Progress: Goal set today Pt will go Sit to Stand: with supervision PT Goal: Sit to Stand - Progress: Goal set today Pt will Ambulate: 51 - 150 feet;with supervision;with rolling walker PT Goal: Ambulate - Progress: Goal set today Pt will Go Up / Down Stairs: with min assist;with least restrictive assistive device;3-5 stairs;with rail(s) PT Goal: Up/Down Stairs - Progress: Goal set today  Visit Information  Last PT Received On: 08/06/12 Assistance Needed: +1    Subjective Data  Subjective: pt sleeping after message saying pt wanted to get OOB ASAP Patient Stated Goal: home   Prior Functioning  Home Living Lives With: Alone Available Help at Discharge: Family;Available 24 hours/day Type of Home: House Home Access: Stairs to enter [employer] of Steps: 3 Entrance Stairs-Rails: Right Home Layout: One level Home Adaptive Equipment: Walker - rolling Additional Comments: needs 3in1; daughters to stay  24/7 as long as needed Prior Function Level of Independence: Independent Communication Communication: No difficulties (blurry vision without contact today per pt dtr)    Cognition  Cognition Arousal/Alertness: Awake/alert (sleepy) Behavior During Therapy: WFL for tasks assessed/performed Overall Cognitive Status: Within Functional Limits for tasks assessed    Extremity/Trunk Assessment Right Lower Extremity Assessment RLE ROM/Strength/Tone: [facility] for tasks assessed Left Lower Extremity  Assessment LLE ROM/Strength/Tone: Deficits;Unable to fully assess;Due to pain  LLE ROM/Strength/Tone Deficits: ankle AAROM WFL, sore; pt able to assist only minimally with  SLR   Balance Static Sitting Balance Static Sitting - Balance Support: Right upper extremity supported;No upper extremity supported;Bilateral upper extremity supported;Feet supported Static Sitting - Level of Assistance: 3: Mod assist;5: Stand by assistance (LOB x 3 in sitting with mod to recover)  End of Session PT - End of Session Equipment Utilized During Treatment: Gait belt;Left knee immobilizer Activity Tolerance: Patient limited by fatigue;Treatment limited secondary to medical complications (Comment) (nauseated; dizzy) Patient left: with call bell/phone within reach;with family/visitor present;in chair Nurse Communication: Mobility status  GP     [facility] 08/06/2012, 10:28 AM

## 2012-08-07 LAB — CBC
HCT: 27.6 % — ABNORMAL LOW (ref 39.0–52.0)
Hemoglobin: 10 g/dL — ABNORMAL LOW (ref 13.0–17.0)
MCH: 31.8 pg (ref 26.0–34.0)
MCV: 87.9 fL (ref 78.0–100.0)
RBC: 3.14 MIL/uL — ABNORMAL LOW (ref 4.22–5.81)
WBC: 9.6 10*3/uL (ref 4.0–10.5)

## 2012-08-07 LAB — BASIC METABOLIC PANEL
CO2: 27 mEq/L (ref 19–32)
Calcium: 8.3 mg/dL — ABNORMAL LOW (ref 8.4–10.5)
Chloride: 100 mEq/L (ref 96–112)
Creatinine, Ser: 1.1 mg/dL (ref 0.50–1.35)
Glucose, Bld: 136 mg/dL — ABNORMAL HIGH (ref 70–99)

## 2012-08-07 NOTE — Progress Notes (Signed)
Pt still unable to urinate this morning after several attempts. Bladder scan reading of 598. Foley placed at straight drain per telephone order from Dr. Lequita Halt. Other than mild discomfort, pt tolerated well.

## 2012-08-07 NOTE — Care Management Note (Addendum)
    Page 1 of 2   08/08/2012     1:08:48 PM   CARE MANAGEMENT NOTE 08/08/2012  Patient:  [name]   Account Number:  [id number]  Date Initiated:  08/07/2012  Documentation initiated by:  Colleen Can  Subjective/Objective Assessment:   dx OA left knee; total knee replacemnt    Pre-arranged with [facility] for hh services which will start day after patient is discharged to home.     Action/Plan:   CM spoke with caregiver-daughter. Zella Ball states she andr 2 other siblings will be caregivers for their father. States pt will need RW and 3n1. Genevieve Norlander will be providing HH services.   Anticipated DC Date:  08/08/2012   Anticipated DC Plan:  HOME W HOME HEALTH SERVICES      DC Planning Services  CM consult      [facility] Choice  HOME HEALTH  DURABLE MEDICAL EQUIPMENT   Choice offered to / List presented to:  C-4 Adult Children   DME arranged  3-N-1  Levan Hurst      DME agency  [facility]     HH arranged  HH-2 PT      [facility] agency  [facility]   Status of service:  Completed, signed off Medicare Important Message given?   (If response is "NO", the following Medicare IM given date fields will be blank) Date Medicare IM given:   Date Additional Medicare IM given:    Discharge Disposition:  HOME W HOME HEALTH SERVICES  Per UR Regulation:    If discussed at Long Length of Stay Meetings, dates discussed:    Comments:  08/08/2012 Colleen Can BSN RN CCM [phone] PLANS ARE FOR PATIENT TO DISCHARGE TODAY WITH [facility] SERVICES IN PLACE. RW AND 3N1 HAVE DELIVERED TO PATIENT'S ROOM. CONTACT INFORMATION FOR [facility] GIVEN TO PATIENT'S DAUGHTER.

## 2012-08-07 NOTE — Progress Notes (Signed)
Physical Therapy Treatment Patient Details Name: [name] MRN: [id number] DOB: 02-03-1936 Today's Date: 08/07/2012 Time: 4098-1191 PT Time Calculation (min): 42 min  PT Assessment / Plan / Recommendation Comments on Treatment Session  POD # 2 R TKR am session.  Applied KI and instructed pt and daughter on use for amb.  Assisted pt OOB with increased time as pt still demonstrates decreased alertness (pt sleepy/groggy) but better than yesterday.  Amb pt in hallway with recliner following for safety.  Performed TKR TE's then applied ICE.  Pt plans to D/C to home with family support, which he WILL need.  Pt not fully alert and HIGH FALL risk.      Follow Up Recommendations  Home health PT;Supervision for mobility/OOB     Does the patient have the potential to tolerate intense rehabilitation     Barriers to Discharge        Equipment Recommendations  None recommended by PT;Other (comment)    Recommendations for Other Services    Frequency 7X/week   Plan Discharge plan remains appropriate;Frequency remains appropriate    Precautions / Restrictions Precautions Precautions: Knee;Fall Precaution Comments: Instructed pt on KI use for amb Required Braces or Orthoses: Knee Immobilizer - Left Knee Immobilizer - Left: Discontinue once straight leg raise with < 10 degree lag Restrictions Weight Bearing Restrictions: No LLE Weight Bearing: Weight bearing as tolerated   Pertinent Vitals/Pain C/o 6/10 R knee pain with act ICE applied    Mobility  Bed Mobility Bed Mobility: Sit to Supine Supine to Sit: 3: Mod assist Details for Bed Mobility Assistance: cues for technique and increased time Transfers Transfers: Sit to Stand;Stand to Sit Sit to Stand: 2: Max assist;3: Mod assist;From bed Sit to Stand: Patient Percentage: 60% Stand to Sit: 2: Max assist;3: Mod assist;To chair/3-in-1 Stand to Sit: Patient Percentage: 60% Details for Transfer Assistance: Verbal cues for hand placement  and LE management. Pt tends to keep R LE out in front too far. Ambulation/Gait Ambulation/Gait Assistance: 3: Mod assist;2: Max Environmental consultant (Feet): 45 Feet Assistive device: Rolling walker Ambulation/Gait Assistance Details: 50% VC's on proper sequencing, proper walker to self distance and upright posture as pt was leaning too far to Right. Gait Pattern: Step-to pattern;Decreased stance time - left Gait velocity: decreased    Exercises   Total Knee Replacement TE's 10 reps B LE ankle pumps 10 reps knee presses 10 reps heel slides  10 reps SAQ's 10 reps SLR's 10 reps ABD Followed by ICE    PT Goals                                                         Progressing slowly    Visit Information  Last PT Received On: 08/07/12 Assistance Needed: +1    Subjective Data      Cognition  Cognition Arousal/Alertness: Awake/alert Behavior During Therapy: [facility] for tasks assessed/performed    Balance  Balance Balance Assessed: Yes Static Sitting Balance Static Sitting - Level of Assistance: 3: Mod assist  End of Session PT - End of Session Equipment Utilized During Treatment: Gait belt;Left knee immobilizer Activity Tolerance: Patient limited by fatigue Patient left: with call bell/phone within reach;with family/visitor present;in bed   Felecia Shelling  PTA [facility]  Acute  Rehab Pager      [phone]

## 2012-08-07 NOTE — Evaluation (Signed)
Occupational Therapy Evaluation Patient Details Name: [name] MRN: [id number] DOB: 02/14/1936 Today's Date: 08/07/2012 Time: 7829-5621 OT Time Calculation (min): 38 min  OT Assessment / Plan / Recommendation Clinical Impression  Pt is s/p L TKA and displays decreased strength and independence with ADL. He will benefit from skilled OT to improve ADL independence and safety for discharge home with daughters.     OT Assessment  Patient needs continued OT Services    Follow Up Recommendations  Home health OT;Supervision/Assistance - 24 hour    Barriers to Discharge      Equipment Recommendations  3 in 1 bedside comode    Recommendations for Other Services    Frequency  Min 2X/week    Precautions / Restrictions Precautions Precautions: Knee;Fall Required Braces or Orthoses: Knee Immobilizer - Left Knee Immobilizer - Left: Discontinue once straight leg raise with < 10 degree lag Restrictions LLE Weight Bearing: Weight bearing as tolerated        ADL  Eating/Feeding: Simulated;Independent Where Assessed - Eating/Feeding: Chair Grooming: Simulated;Wash/dry hands;Set up Where Assessed - Grooming: Supported sitting Upper Body Bathing: Simulated;Chest;Right arm;Left arm;Abdomen;Set up;Supervision/safety Where Assessed - Upper Body Bathing: Unsupported sitting Lower Body Bathing: +2 Total assistance Lower Body Bathing: Patient Percentage: 50% Where Assessed - Lower Body Bathing: Supported sit to stand Upper Body Dressing: Simulated;Set up;Supervision/safety Where Assessed - Upper Body Dressing: Unsupported sitting Lower Body Dressing: Simulated;+2 Total assistance Lower Body Dressing: Patient Percentage: 40% Where Assessed - Lower Body Dressing: Supported sit to stand Toilet Transfer: Performed;+2 Total assistance Toilet Transfer: Patient Percentage: 60% Toilet Transfer Method: Other (comment) (with walker into bathroom) Toilet Transfer Equipment: Raised toilet seat with  arms (or 3-in-1 over toilet) Toileting - Clothing Manipulation and Hygiene: Simulated;+2 Total assistance Toileting - Clothing Manipulation and Hygiene: Patient Percentage: 50% Where Assessed - Glass blower/designer Manipulation and Hygiene: Standing Equipment Used: Rolling walker ADL Comments: Pt needing to try to have a BM. Nursing in room and assisted OT with transferring pt into bathroom. Pt with increased pain 8/10 with activity so +2 used for safety. Pt needed moderate verbal cues for hand placement and LE positioning with transfers as well as cues for distance from self to RW and sequence. Pt unable to urinate in standing with urinal and tried warm washcloth to genital area in standing but no success in urination. He states he sometimes has to run warm water over this area at home to be able to urinate. Left pt in chair with nursing to cath pt.     OT Diagnosis: Generalized weakness;Acute pain  OT Problem List: Decreased strength;Impaired balance (sitting and/or standing);Decreased activity tolerance;Decreased knowledge of use of DME or AE;Pain OT Treatment Interventions: Self-care/ADL training;Therapeutic activities;DME and/or AE instruction;Patient/family education   OT Goals Acute Rehab OT Goals OT Goal Formulation: With patient Time For Goal Achievement: 08/14/12 Potential to Achieve Goals: Good ADL Goals Pt Will Perform Grooming: with min assist;Standing at sink ADL Goal: Grooming - Progress: Goal set today Pt Will Transfer to Toilet: with min assist;Ambulation;3-in-1 ADL Goal: Toilet Transfer - Progress: Goal set today Pt Will Perform Toileting - Clothing Manipulation: with min assist;Standing ADL Goal: Toileting - Clothing Manipulation - Progress: Goal set today Pt Will Perform Tub/Shower Transfer: Shower transfer;with DME;with min assist ADL Goal: Web designer - Progress: Goal set today Additional ADL Goal #1: Pt/family will be independent with LB dressing/bathing with  good safety and RW use.  ADL Goal: Additional Goal #1 - Progress: Goal set today  Visit Information  Last OT Received On: 08/07/12 Assistance Needed: +2    Subjective Data  Subjective: I need to try to have a bowel movement Patient Stated Goal: none stated. agreeable to OT   Prior Functioning     Home Living Lives With: Alone Available Help at Discharge: Family;Available 24 hours/day Type of Home: House Home Access: Stairs to enter [employer] of Steps: 3 Entrance Stairs-Rails: Right Home Layout: One level Bathroom Shower/Tub: Tub/shower unit;Walk-in shower Bathroom Toilet: Standard Home Adaptive Equipment: Walker - rolling Additional Comments: needs 3in1; daughters to stay  24/7 as long as needed Prior Function Level of Independence: Independent Communication Communication: No difficulties         Vision/Perception Vision - History Visual History: Glaucoma;Macular degeneration   Cognition  Cognition Arousal/Alertness: Awake/alert Behavior During Therapy: WFL for tasks assessed/performed    Extremity/Trunk Assessment Right Upper Extremity Assessment RUE ROM/Strength/Tone: WFL for tasks assessed Left Upper Extremity Assessment LUE ROM/Strength/Tone: WFL for tasks assessed     Mobility Transfers Transfers: Sit to Stand;Stand to Sit Sit to Stand: 1: +2 Total assist;With upper extremity assist;From chair/3-in-1 Sit to Stand: Patient Percentage: 60% Stand to Sit: 1: +2 Total assist;To chair/3-in-1 Stand to Sit: Patient Percentage: 60% Details for Transfer Assistance: Verbal cues for hand placement and LE management. Pt tends to keep R LE out in front too far.     Exercise     Balance Balance Balance Assessed: Yes Static Sitting Balance Static Sitting - Level of Assistance: 3: Mod assist   End of Session OT - End of Session Equipment Utilized During Treatment: Gait belt Activity Tolerance: Patient limited by pain Patient left: in chair;with  call bell/phone within reach;with nursing in room  GO     Lennox Laity [phone] 08/07/2012, 12:36 PM

## 2012-08-07 NOTE — Progress Notes (Signed)
   Subjective: 2 Days Post-Op Procedure(s) (LRB): LEFT TOTAL KNEE ARTHROPLASTY (Left) Patient reports pain as moderate.   Plan is to go Home after hospital stay.  Objective: Vital signs in last 24 hours: Temp:  [99.5 F (37.5 C)-101.1 F (38.4 C)] 100.1 F (37.8 C) (06/11 0628) Pulse Rate:  [56-73] 70 (06/11 0628) Resp:  [14-18] 16 (06/11 0800) BP: (133-168)/(54-73) 164/59 mmHg (06/11 0628) SpO2:  [90 %-100 %] 100 % (06/11 0628)  Intake/Output from previous day:  Intake/Output Summary (Last 24 hours) at 08/07/12 0813 Last data filed at 08/07/12 0629  Gross per 24 hour  Intake 2183.33 ml  Output    850 ml  Net 1333.33 ml    Intake/Output this shift:    Labs:  Recent Labs  08/06/12 0415 08/07/12 0423  HGB 11.8* 10.0*    Recent Labs  08/06/12 0415 08/07/12 0423  WBC 7.5 9.6  RBC 3.75* 3.14*  HCT 33.2* 27.6*  PLT 121* 122*    Recent Labs  08/06/12 0415 08/07/12 0423  NA 135 133*  K 3.6 3.4*  CL 101 100  CO2 27 27  BUN 10 9  CREATININE 1.05 1.10  GLUCOSE 161* 136*  CALCIUM 8.4 8.3*   No results found for this basename: LABPT, INR,  in the last 72 hours  EXAM General - Patient is Alert, Appropriate and Oriented Extremity - Neurologically intact Neurovascular intact Incision: dressing C/D/I No cellulitis present Compartment soft Dressing/Incision - clean, dry, no drainage Motor Function - intact, moving foot and toes well on exam.   Past Medical History  Diagnosis Date  . HTN (hypertension)   . Otitis media     with intermittent vertigo  . DJD (degenerative joint disease)   . Glaucoma   . Complication of anesthesia     HARD TO WAKE UP  . Weakness     of neck muscles  . Constipation     Assessment/Plan: 2 Days Post-Op Procedure(s) (LRB): LEFT TOTAL KNEE ARTHROPLASTY (Left) Principal Problem:   OA (osteoarthritis) of knee Active Problems:   Postoperative anemia due to acute blood loss   Advance diet Up with therapy D/C IV  fluids Plan for discharge tomorrow  DVT Prophylaxis - Xarelto Weight-Bearing as tolerated to left leg  Kerianne Gurr V 08/07/2012, 8:13 AM

## 2012-08-07 NOTE — Progress Notes (Signed)
Physical Therapy Treatment Patient Details Name: [name] MRN: [id number] DOB: 1935-11-07 Today's Date: 08/07/2012 Time: 4098-1191 PT Time Calculation (min): 26 min  PT Assessment / Plan / Recommendation Comments on Treatment Session  POD # 2 R TKR pm session.  Pt NOT progressing well.  Very unsteady gait and limited activity tolerance.  Pt c/o nausea with excersion.  Pt slept most of the day in the recliner.  Concerns about pt D/Cing tomorrow.    Follow Up Recommendations  Home health PT;Supervision for mobility/OOB     Does the patient have the potential to tolerate intense rehabilitation     Barriers to Discharge        Equipment Recommendations  None recommended by PT;Other (comment)    Recommendations for Other Services    Frequency 7X/week   Plan Discharge plan remains appropriate;Frequency remains appropriate    Precautions / Restrictions Precautions Precautions: Knee;Fall Precaution Comments: Instructed pt on KI use for amb Required Braces or Orthoses: Knee Immobilizer - Left Knee Immobilizer - Left: Discontinue once straight leg raise with < 10 degree lag Restrictions Weight Bearing Restrictions: No LLE Weight Bearing: Weight bearing as tolerated   Pertinent Vitals/Pain C/o nausea with excersion    Mobility  Bed Mobility Bed Mobility: Not assessed Supine to Sit: 3: Mod assist Details for Bed Mobility Assistance: Pt OOB in recliner Transfers Transfers: Sit to Stand;Stand to Sit Sit to Stand: 2: Max assist;3: Mod assist;From chair/3-in-1 Stand to Sit: 2: Max assist;3: Mod assist;To chair/3-in-1 Details for Transfer Assistance: Verbal cues for hand placement and LE management. Increased assist out of low recliner. Ambulation/Gait Ambulation/Gait Assistance: 2: Max assist Ambulation Distance (Feet): 45 Feet Assistive device: Rolling walker Ambulation/Gait Assistance Details: 50% VC's to increase self assist using B UE's.  Instructed to forward lean and  push up from chair vs pull up from walker. Gait Pattern: Step-to pattern;Decreased stance time - left Gait velocity: decreased General Gait Details: Amb limited 2nd MAX c/o feeling nausea and trwemors throughout.  Recliner brought to pt.     PT Goals                                                        progressing    Visit Information  Last PT Received On: 08/07/12 Assistance Needed: +2    Subjective Data  Subjective: I don't feel good Patient Stated Goal: home   Cognition       Balance     End of Session PT - End of Session Equipment Utilized During Treatment: Gait belt;Left knee immobilizer Activity Tolerance: Patient limited by fatigue Patient left: with call bell/phone within reach;with family/visitor present;in bed   Felecia Shelling  PTA [facility]  Acute  Rehab Pager      [phone]

## 2012-08-08 LAB — CBC
HCT: 25.2 % — ABNORMAL LOW (ref 39.0–52.0)
MCH: 31.1 pg (ref 26.0–34.0)
MCV: 88.1 fL (ref 78.0–100.0)
Platelets: 105 10*3/uL — ABNORMAL LOW (ref 150–400)
RDW: 13.6 % (ref 11.5–15.5)

## 2012-08-08 MED ORDER — METHOCARBAMOL 500 MG PO TABS: 500.0000 mg | ORAL_TABLET | Freq: Four times a day (QID) | ORAL | Status: DC | PRN

## 2012-08-08 MED ORDER — OXYCODONE HCL 5 MG PO TABS: 5.0000 mg | ORAL_TABLET | ORAL | Status: DC | PRN

## 2012-08-08 MED ORDER — RIVAROXABAN 10 MG PO TABS: 10.0000 mg | ORAL_TABLET | Freq: Every day | ORAL | Status: DC

## 2012-08-08 MED ORDER — BISACODYL 10 MG RE SUPP
10.0000 mg | Freq: Once | RECTAL | Status: AC
  Administered 2012-08-08: 10 mg via RECTAL

## 2012-08-08 MED ORDER — TRAMADOL HCL 50 MG PO TABS: 50.0000 mg | ORAL_TABLET | Freq: Four times a day (QID) | ORAL | Status: DC | PRN

## 2012-08-08 NOTE — Progress Notes (Signed)
Physical Therapy Treatment Patient Details Name: [name] MRN: [id number] DOB: 07-09-1935 Today's Date: 08/08/2012 Time: 2956-2130 PT Time Calculation (min): 44 min  PT Assessment / Plan / Recommendation Comments on Treatment Session  POD # 3 L TKR am session.  Assisted pt OOB to amb to BR for a BM (mostly gas) then amb to recliner to perform TE's.  Pt progressing slowly and fatigues quickly.  After using bathroom pt was too fatigued to attempt amb in hallway.      Follow Up Recommendations  Home health PT;Supervision for mobility/OOB     Does the patient have the potential to tolerate intense rehabilitation     Barriers to Discharge        Equipment Recommendations       Recommendations for Other Services    Frequency     Plan Discharge plan remains appropriate;Frequency remains appropriate    Precautions / Restrictions Precautions Precautions: Knee;Fall Precaution Comments: Instructed pt on KI use for amb Required Braces or Orthoses: Knee Immobilizer - Left Knee Immobilizer - Left: Discontinue once straight leg raise with < 10 degree lag Restrictions Weight Bearing Restrictions: No LLE Weight Bearing: Weight bearing as tolerated   Pertinent Vitals/Pain C/o 3/10 knee pain with amb    Mobility  Bed Mobility Bed Mobility: Supine to Sit Supine to Sit: 3: Mod assist Details for Bed Mobility Assistance: max assist to support L LE and increased time Transfers Transfers: Sit to Stand;Stand to Sit Sit to Stand: 4: Min assist;With upper extremity assist;From chair/3-in-1;3: Mod assist;From toilet Stand to Sit: 3: Mod assist;With upper extremity assist;To chair/3-in-1;To toilet Details for Transfer Assistance: increased time and 25% VC's to increase self assist using B UE's as pt states his other knee is bad. Max assist to support LE with stand to sit.  Pt tends to sit uncontrolled. Ambulation/Gait Ambulation/Gait Assistance: 4: Min assist Ambulation Distance (Feet): 20  Feet Ambulation/Gait Assistance Details: Amb to and from bathroom only this am due to bowel issues. Pt requires increased time and amb with flexed hips and forward posture. Slow sluggish gait. Gait Pattern: Step-to pattern;Decreased stance time - left Gait velocity: decreased      PT Goals                                                          Progressing slowly    Visit Information  Last PT Received On: 08/08/12 Assistance Needed: +1    Subjective Data      Cognition  Cognition Arousal/Alertness: Awake/alert Behavior During Therapy: WFL for tasks assessed/performed Overall Cognitive Status: Within Functional Limits for tasks assessed    Balance  Balance Balance Assessed: Yes Static Sitting Balance Static Sitting - Balance Support: Left upper extremity supported (alternate hand held on walker) Static Sitting - Level of Assistance: 4: Min assist  End of Session PT - End of Session Equipment Utilized During Treatment: Gait belt;Left knee immobilizer Activity Tolerance: Patient limited by fatigue Patient left: with call bell/phone within reach;with family/visitor present;in chair Nurse Communication: Mobility status   Felecia Shelling  PTA [facility]  Acute  Rehab Pager      [phone]

## 2012-08-08 NOTE — Progress Notes (Signed)
Physical Therapy Treatment Patient Details Name: [name] MRN: [id number] DOB: 1935/10/31 Today's Date: 08/08/2012 Time: 1440-1510 PT Time Calculation (min): 30 min  PT Assessment / Plan / Recommendation Comments on Treatment Session  POD # 3 L TKR pm session.  Assisted pt OOB to amb in hallway then back to bed for CPM.  Pt progressing better and believe with family assist mobility will be safe.  Will practice steps before D/C.    Follow Up Recommendations  Home health PT;Supervision for mobility/OOB     Does the patient have the potential to tolerate intense rehabilitation     Barriers to Discharge        Equipment Recommendations       Recommendations for Other Services    Frequency 7X/week   Plan Discharge plan remains appropriate;Frequency remains appropriate    Precautions / Restrictions Precautions Precautions: Knee;Fall Precaution Comments: Instructed pt on KI use for amb Required Braces or Orthoses: Knee Immobilizer - Left Knee Immobilizer - Left: Discontinue once straight leg raise with < 10 degree lag Restrictions Weight Bearing Restrictions: No LLE Weight Bearing: Weight bearing as tolerated   Pertinent Vitals/Pain C/o 4/10 pain with amb ICE applied    Mobility  Bed Mobility Bed Mobility: Supine to Sit;Sit to Supine Supine to Sit: 3: Mod assist Sit to Supine: 2: Max assist Details for Bed Mobility Assistance: assistance needed for L LE and increased time Transfers Transfers: Sit to Stand;Stand to Sit Sit to Stand: 4: Min assist;From bed Stand to Sit: 4: Min assist;To bed Details for Transfer Assistance: increased time and 25% VC's to increase self assist using B UE's as pt states his other knee is bad. Max assist to support LE with stand to sit.  Pt tends to sit uncontrolled. Ambulation/Gait Ambulation/Gait Assistance: 4: Min guard Ambulation Distance (Feet): 68 Feet Assistive device: Rolling walker Ambulation/Gait Assistance Details: increased  time and KI on.  other knee is "bad" stated pt.  No LOB. Better ability to self rise. Gait Pattern: Step-to pattern;Decreased stance time - left Gait velocity: decreased     PT Goals                                                     progressing    Visit Information  Last PT Received On: 08/08/12 Assistance Needed: +1    Subjective Data      Cognition  Cognition Arousal/Alertness: Awake/alert Behavior During Therapy: WFL for tasks assessed/performed Overall Cognitive Status: Within Functional Limits for tasks assessed    Balance  Balance Balance Assessed: Yes Static Sitting Balance Static Sitting - Balance Support: Left upper extremity supported (alternate hand held on walker) Static Sitting - Level of Assistance: 4: Min assist  End of Session PT - End of Session Equipment Utilized During Treatment: Gait belt;Left knee immobilizer Activity Tolerance: Patient limited by fatigue Patient left: in bed;with family/visitor present;with call bell/phone within reach Nurse Communication: Mobility status   Felecia Shelling  PTA [facility]  Acute  Rehab Pager      [phone]

## 2012-08-08 NOTE — Progress Notes (Signed)
Occupational Therapy Treatment Patient Details Name: [name] MRN: [id number] DOB: 05/11/1935 Today's Date: 08/08/2012 Time: 4098-1191 OT Time Calculation (min): 33 min  OT Assessment / Plan / Recommendation Comments on Treatment Session Worked on LB dressing and KI donning/doffing education with family/pt. Pt doing better than at eval.     Follow Up Recommendations  Home health OT;Supervision/Assistance - 24 hour    Barriers to Discharge       Equipment Recommendations  3 in 1 bedside comode    Recommendations for Other Services    Frequency Min 2X/week   Plan Discharge plan remains appropriate    Precautions / Restrictions Precautions Precautions: Knee;Fall Precaution Comments: Instructed pt on KI use for amb Required Braces or Orthoses: Knee Immobilizer - Left Knee Immobilizer - Left: Discontinue once straight leg raise with < 10 degree lag Restrictions Weight Bearing Restrictions: No LLE Weight Bearing: Weight bearing as tolerated   Pertinent Vitals/Pain 8/10 soreness L knee; informed nursing, ice placed.    ADL  ADL Comments: Pt states he had recent eye surgery and is not allowed to get his face wet. Unless they decide to install a hand held shower, pt may be sponge bathing at d/c to avoid getting his face wet. Educated both daugthers who are present in room on KI use and how to don/doff. One daughter practiced hands on with donning KI, pants, and helped provide balance support along with OT to stand and pull up pants. Took pants off after practice since pt has a catheter. Educated on how to provide safe assist in standing and importance of KI being on when pt gets ready to stand as well as keeping RW in front of him to stand. Daughter not yet independent with LB self care for goal. Needed cues for donning KI properly and safety cues with transfer. Pt declines need to practice 3in1 transfer right now. States he has been up to the bathroom several times and is tired. He  was agreeable to at least work on LB dressing. They decline AE use and daughters will help.  Recommend HHOT for followup and shower transfer if pt ready/wanting to shower (if they decide to get a hand held)   OT Diagnosis:    OT Problem List:   OT Treatment Interventions:     OT Goals Acute Rehab OT Goals OT Goal Formulation: With patient Time For Goal Achievement: 08/14/12 Potential to Achieve Goals: Good ADL Goals Pt Will Perform Grooming: with min assist;Standing at sink Pt Will Transfer to Toilet: with min assist;Ambulation;3-in-1 Pt Will Perform Toileting - Clothing Manipulation: with min assist;Standing Pt Will Perform Tub/Shower Transfer: Shower transfer;with DME;with min assist Additional ADL Goal #1: Pt/family will be independent with LB dressing/bathing with good safety and RW use.  ADL Goal: Additional Goal #1 - Progress: Progressing toward goals  Visit Information  Last OT Received On: 08/08/12 Assistance Needed: +1    Subjective Data  Subjective: I went to the bathroom several times this morning Patient Stated Goal: pt agreeable to OT; none stated.   Prior Functioning       Cognition  Cognition Arousal/Alertness: Awake/alert Behavior During Therapy: WFL for tasks assessed/performed Overall Cognitive Status: Within Functional Limits for tasks assessed    Mobility  Transfers Transfers: Sit to Stand;Stand to Sit Sit to Stand: 4: Min assist;With upper extremity assist;From chair/3-in-1 Stand to Sit: 3: Mod assist;With upper extremity assist;To chair/3-in-1 Details for Transfer Assistance: min assist to rise and stabilize but mod assist to control descent  as pt tended to "plop" back in chair. Verbal cues for hand placement and LE management. Pt tends to keep R LE out in front too far.     Exercises      Balance Balance Balance Assessed: Yes Static Sitting Balance Static Sitting - Balance Support: Left upper extremity supported (alternate hand held on  walker) Static Sitting - Level of Assistance: 4: Min assist   End of Session OT - End of Session Activity Tolerance: Patient tolerated treatment well Patient left: in chair;with call bell/phone within reach;with family/visitor present  GO     Lennox Laity [phone] 08/08/2012, 12:43 PM

## 2012-08-08 NOTE — Progress Notes (Signed)
   Subjective: 3 Days Post-Op Procedure(s) (LRB): LEFT TOTAL KNEE ARTHROPLASTY (Left) Patient reports pain as mild and moderate.   Patient seen in rounds with Dr. Lequita Halt. Family in room. Patient is well, but has had some minor complaints of pain in the knee, requiring pain medications Patient is not ready for home right now but will see how he does today.  Probably home tomorrow but will see how he does today.  Objective: Vital signs in last 24 hours: Temp:  [98.7 F (37.1 C)-102.8 F (39.3 C)] 99 F (37.2 C) (06/12 0300) Pulse Rate:  [59-83] 59 (06/12 0300) Resp:  [16-18] 16 (06/12 0300) BP: (145-168)/(64-72) 145/71 mmHg (06/12 0300) SpO2:  [95 %-97 %] 96 % (06/12 0300)  Intake/Output from previous day:  Intake/Output Summary (Last 24 hours) at 08/08/12 0710 Last data filed at 08/08/12 7829  Gross per 24 hour  Intake 1566.67 ml  Output   2050 ml  Net -483.33 ml    Intake/Output this shift:    Labs:  Recent Labs  08/06/12 0415 08/07/12 0423 08/08/12 0447  HGB 11.8* 10.0* 8.9*    Recent Labs  08/07/12 0423 08/08/12 0447  WBC 9.6 8.8  RBC 3.14* 2.86*  HCT 27.6* 25.2*  PLT 122* PENDING    Recent Labs  08/06/12 0415 08/07/12 0423  NA 135 133*  K 3.6 3.4*  CL 101 100  CO2 27 27  BUN 10 9  CREATININE 1.05 1.10  GLUCOSE 161* 136*  CALCIUM 8.4 8.3*   No results found for this basename: LABPT, INR,  in the last 72 hours  EXAM: General - Patient is Alert, Appropriate and Oriented Extremity - Neurovascular intact Sensation intact distally Dorsiflexion/Plantar flexion intact No cellulitis present Incision - clean, dry, no drainage, healing Motor Function - intact, moving foot and toes well on exam.   Assessment/Plan: 3 Days Post-Op Procedure(s) (LRB): LEFT TOTAL KNEE ARTHROPLASTY (Left) Procedure(s) (LRB): LEFT TOTAL KNEE ARTHROPLASTY (Left) Past Medical History  Diagnosis Date  . HTN (hypertension)   . Otitis media     with intermittent  vertigo  . DJD (degenerative joint disease)   . Glaucoma   . Complication of anesthesia     HARD TO WAKE UP  . Weakness     of neck muscles  . Constipation    Principal Problem:   OA (osteoarthritis) of knee Active Problems:   Postoperative anemia due to acute blood loss  Estimated body mass index is 27.87 kg/(m^2) as calculated from the following:   Height as of this encounter: 6\' 3"  (1.905 m).   Weight as of this encounter: 101.152 kg (223 lb). Up with therapy Discharge home with home health when met all goals Diet - Cardiac diet Follow up - in 2 weeks Activity - WBAT Disposition - Home Condition Upon Discharge -Pending DVT Prophylaxis - Xarelto  PERKINS, ALEXZANDREW 08/08/2012, 7:10 AM

## 2012-08-08 NOTE — Discharge Summary (Signed)
Physician Discharge Summary   Patient ID: [name] MRN: [id number] DOB/AGE: Jul 12, 1935 77 y.o.  Admit date: 08/05/2012 Discharge date: 08/09/2012  Primary Diagnosis: Osteoarthritis Left knee  Admission Diagnoses:  Past Medical History  Diagnosis Date  . HTN (hypertension)   . Otitis media     with intermittent vertigo  . DJD (degenerative joint disease)   . Glaucoma   . Complication of anesthesia     HARD TO WAKE UP  . Weakness     of neck muscles  . Constipation    Discharge Diagnoses:   Principal Problem:   OA (osteoarthritis) of knee Active Problems:   Postoperative anemia due to acute blood loss  Estimated body mass index is 27.87 kg/(m^2) as calculated from the following:   Height as of this encounter: 6\' 3"  (1.905 m).   Weight as of this encounter: 101.152 kg (223 lb).  Procedure:  Procedure(s) (LRB): LEFT TOTAL KNEE ARTHROPLASTY (Left)   Consults: None  HPI: [name] is a 77 y.o. year old male with end stage OA of his left knee with progressively worsening pain and dysfunction. He has constant pain, with activity and at rest and significant functional deficits with difficulties even with ADLs. He has had extensive non-op management including analgesics, injections of cortisone and viscosupplements, and home exercise program, but remains in significant pain with significant dysfunction. Radiographs show bone on bone arthritis lateral and patellofemoral with valgus deformity. He presents now for left Total Knee Arthroplasty.   Laboratory Data: Admission on 08/05/2012, Discharged on 08/09/2012  Component Date Value Range Status  . WBC 08/06/2012 7.5  4.0 - 10.5 K/uL Final  . RBC 08/06/2012 3.75* 4.22 - 5.81 MIL/uL Final  . Hemoglobin 08/06/2012 11.8* 13.0 - 17.0 g/dL Final  . HCT 40/98/1191 33.2* 39.0 - 52.0 % Final  . MCV 08/06/2012 88.5  78.0 - 100.0 fL Final  . MCH 08/06/2012 31.5  26.0 - 34.0 pg Final  . MCHC 08/06/2012 35.5  30.0 - 36.0 g/dL Final    . RDW 47/82/9562 13.4  11.5 - 15.5 % Final  . Platelets 08/06/2012 121* 150 - 400 K/uL Final   REPEATED TO VERIFY  . Sodium 08/06/2012 135  135 - 145 mEq/L Final  . Potassium 08/06/2012 3.6  3.5 - 5.1 mEq/L Final  . Chloride 08/06/2012 101  96 - 112 mEq/L Final  . CO2 08/06/2012 27  19 - 32 mEq/L Final  . Glucose, Bld 08/06/2012 161* 70 - 99 mg/dL Final  . BUN 13/09/6576 10  6 - 23 mg/dL Final  . Creatinine, Ser 08/06/2012 1.05  0.50 - 1.35 mg/dL Final  . Calcium 46/96/2952 8.4  8.4 - 10.5 mg/dL Final  . GFR calc non Af Amer 08/06/2012 67* >90 mL/min Final  . GFR calc Af Amer 08/06/2012 78* >90 mL/min Final   Comment:                                 The eGFR has been calculated                          using the CKD EPI equation.                          This calculation has not been  validated in all clinical                          situations.                          eGFR's persistently                          <90 mL/min signify                          possible Chronic Kidney Disease.  . WBC 08/07/2012 9.6  4.0 - 10.5 K/uL Final  . RBC 08/07/2012 3.14* 4.22 - 5.81 MIL/uL Final  . Hemoglobin 08/07/2012 10.0* 13.0 - 17.0 g/dL Final   REPEATED TO VERIFY  . HCT 08/07/2012 27.6* 39.0 - 52.0 % Final  . MCV 08/07/2012 87.9  78.0 - 100.0 fL Final  . MCH 08/07/2012 31.8  26.0 - 34.0 pg Final  . MCHC 08/07/2012 36.2* 30.0 - 36.0 g/dL Final  . RDW 16/11/9602 13.4  11.5 - 15.5 % Final  . Platelets 08/07/2012 122* 150 - 400 K/uL Final   Comment: REPEATED TO VERIFY                          SPECIMEN CHECKED FOR CLOTS  . Sodium 08/07/2012 133* 135 - 145 mEq/L Final  . Potassium 08/07/2012 3.4* 3.5 - 5.1 mEq/L Final  . Chloride 08/07/2012 100  96 - 112 mEq/L Final  . CO2 08/07/2012 27  19 - 32 mEq/L Final  . Glucose, Bld 08/07/2012 136* 70 - 99 mg/dL Final  . BUN 54/10/8117 9  6 - 23 mg/dL Final  . Creatinine, Ser 08/07/2012 1.10  0.50 - 1.35 mg/dL Final  .  Calcium 14/78/2956 8.3* 8.4 - 10.5 mg/dL Final  . GFR calc non Af Amer 08/07/2012 64* >90 mL/min Final  . GFR calc Af Amer 08/07/2012 74* >90 mL/min Final   Comment:                                 The eGFR has been calculated                          using the CKD EPI equation.                          This calculation has not been                          validated in all clinical                          situations.                          eGFR's persistently                          <90 mL/min signify                          possible Chronic Kidney Disease.  . WBC  08/08/2012 8.8  4.0 - 10.5 K/uL Final  . RBC 08/08/2012 2.86* 4.22 - 5.81 MIL/uL Final  . Hemoglobin 08/08/2012 8.9* 13.0 - 17.0 g/dL Final  . HCT 96/05/5407 25.2* 39.0 - 52.0 % Final  . MCV 08/08/2012 88.1  78.0 - 100.0 fL Final  . MCH 08/08/2012 31.1  26.0 - 34.0 pg Final  . MCHC 08/08/2012 35.3  30.0 - 36.0 g/dL Final  . RDW 81/19/1478 13.6  11.5 - 15.5 % Final  . Platelets 08/08/2012 105* 150 - 400 K/uL Final   Comment: SPECIMEN CHECKED FOR CLOTS                          REPEATED TO VERIFY                          PLATELET COUNT CONFIRMED BY [facility] Outpatient Visit on 07/29/2012  Component Date Value Range Status  . aPTT 07/29/2012 36  24 - 37 seconds Final  . WBC 07/29/2012 4.4  4.0 - 10.5 K/uL Final  . RBC 07/29/2012 4.36  4.22 - 5.81 MIL/uL Final  . Hemoglobin 07/29/2012 13.9  13.0 - 17.0 g/dL Final  . HCT 29/56/2130 38.7* 39.0 - 52.0 % Final  . MCV 07/29/2012 88.8  78.0 - 100.0 fL Final  . MCH 07/29/2012 31.9  26.0 - 34.0 pg Final  . MCHC 07/29/2012 35.9  30.0 - 36.0 g/dL Final  . RDW 86/57/8469 13.6  11.5 - 15.5 % Final  . Platelets 07/29/2012 157  150 - 400 K/uL Final  . Sodium 07/29/2012 140  135 - 145 mEq/L Final  . Potassium 07/29/2012 3.7  3.5 - 5.1 mEq/L Final  . Chloride 07/29/2012 103  96 - 112 mEq/L Final  . CO2 07/29/2012 30  19 - 32 mEq/L Final  . Glucose, Bld 07/29/2012 85  70 - 99  mg/dL Final  . BUN 62/95/2841 9  6 - 23 mg/dL Final  . Creatinine, Ser 07/29/2012 1.09  0.50 - 1.35 mg/dL Final  . Calcium 32/44/0102 9.6  8.4 - 10.5 mg/dL Final  . Total Protein 07/29/2012 7.3  6.0 - 8.3 g/dL Final  . Albumin 72/53/6644 3.6  3.5 - 5.2 g/dL Final  . AST 03/47/4259 24  0 - 37 U/L Final  . ALT 07/29/2012 15  0 - 53 U/L Final  . Alkaline Phosphatase 07/29/2012 74  39 - 117 U/L Final  . Total Bilirubin 07/29/2012 0.5  0.3 - 1.2 mg/dL Final  . GFR calc non Af Amer 07/29/2012 64* >90 mL/min Final  . GFR calc Af Amer 07/29/2012 75* >90 mL/min Final   Comment:                                 The eGFR has been calculated                          using the CKD EPI equation.                          This calculation has not been                          validated in all clinical  situations.                          eGFR's persistently                          <90 mL/min signify                          possible Chronic Kidney Disease.  Marland Kitchen Prothrombin Time 07/29/2012 15.0  11.6 - 15.2 seconds Final  . INR 07/29/2012 1.20  0.00 - 1.49 Final  . ABO/RH(D) 07/29/2012 B POS   Final  . Antibody Screen 07/29/2012 NEG   Final  . Sample Expiration 07/29/2012 08/08/2012   Final  . Color, Urine 07/29/2012 YELLOW  YELLOW Final  . APPearance 07/29/2012 CLEAR  CLEAR Final  . Specific Gravity, Urine 07/29/2012 1.015  1.005 - 1.030 Final  . pH 07/29/2012 6.5  5.0 - 8.0 Final  . Glucose, UA 07/29/2012 NEGATIVE  NEGATIVE mg/dL Final  . Hgb urine dipstick 07/29/2012 NEGATIVE  NEGATIVE Final  . Bilirubin Urine 07/29/2012 NEGATIVE  NEGATIVE Final  . Ketones, ur 07/29/2012 NEGATIVE  NEGATIVE mg/dL Final  . Protein, ur 40/98/1191 NEGATIVE  NEGATIVE mg/dL Final  . Urobilinogen, UA 07/29/2012 0.2  0.0 - 1.0 mg/dL Final  . Nitrite 47/82/9562 NEGATIVE  NEGATIVE Final  . Leukocytes, UA 07/29/2012 NEGATIVE  NEGATIVE Final   MICROSCOPIC NOT DONE ON URINES WITH NEGATIVE PROTEIN,  BLOOD, LEUKOCYTES, NITRITE, OR GLUCOSE <1000 mg/dL.  Marland Kitchen MRSA, PCR 07/29/2012 NEGATIVE  NEGATIVE Final  . Staphylococcus aureus 07/29/2012 POSITIVE* NEGATIVE Final   Comment:                                 The Xpert SA Assay (FDA                          approved for NASAL specimens                          in patients over 33 years of age),                          is one component of                          a comprehensive surveillance                          program.  Test performance has                          been validated by [employer] for patients greater                          than or equal to 20 year old.                          It is not intended  to diagnose infection nor to                          guide or monitor treatment.  . ABO/RH(D) 07/29/2012 B POS   Final     X-Rays:Dg Chest 2 View  07/29/2012   *RADIOLOGY REPORT*  Clinical Data: Preop for left total knee replacement  CHEST - 2 VIEW  Comparison: Chest x-ray of 09/26/2010  Findings: No active infiltrate or effusion is seen.  On the initial lateral view posteriorly there is opacity overlying a posterior lower thoracic vertebra. A repeat lateral view does not show this area to be as prominent, and this most likely represents overlapping bony and vascular structures.  Attention to this area on follow-up chest x-ray is recommended.  The heart is mildly enlarged and stable.  There are diffuse degenerative changes throughout the thoracic spine.  IMPRESSION:  1.  No active lung disease.  Stable borderline cardiomegaly. 2.  Vague opacity on one lateral view posteriorly of doubtful significance.  Recommend attention to this area on follow-up chest x-ray.   Original Report Authenticated By: Dwyane Dee, M.D.    EKG: Orders placed during the hospital encounter of 08/05/12  . EKG     Hospital Course: [name] is a 77 y.o. who was admitted to [facility]. They  were brought to the operating room on 08/05/2012 and underwent Procedure(s): LEFT TOTAL KNEE ARTHROPLASTY.  Patient tolerated the procedure well and was later transferred to the recovery room and then to the orthopaedic floor for postoperative care.  They were given PO and IV analgesics for pain control following their surgery.  They were given 24 hours of postoperative antibiotics of  Anti-infectives   Start     Dose/Rate Route Frequency Ordered Stop   08/05/12 2100  ceFAZolin (ANCEF) IVPB 1 g/50 mL premix     1 g 100 mL/hr over 30 Minutes Intravenous Every 6 hours 08/05/12 1807 08/06/12 0241   08/05/12 1045  ceFAZolin (ANCEF) IVPB 2 g/50 mL premix     2 g 100 mL/hr over 30 Minutes Intravenous On call to O.R. 08/05/12 1039 08/05/12 1440     and started on DVT prophylaxis in the form of Xarelto.   PT and OT were ordered for total joint protocol.  Discharge planning consulted to help with postop disposition and equipment needs.  Patient had a tough night on the evening of surgery with pain and elevated blood pressure.  They started to get up OOB with therapy on day one but required a lot of assistance. Hemovac drain was pulled without difficulty.  Continued to work with therapy into day two despite having a lot of pain still.  Dressing was changed on day two and the incision was healing well.  By day three, the patient was not ready for home  but was felt that might be ready the following day. Incision was healing well.  Patient was seen in rounds on day four. Patient was doing better, eating etter and had 2 BM's the day before. No longer nauseated. Had elevated temp the night before but afebrile on rounds. No constitutional symptoms and was ready to go home.   Discharge Medications: Prior to Admission medications   Medication Sig Start Date End Date Taking? Authorizing Provider  atenolol (TENORMIN) 50 MG tablet Take 50 mg by mouth 2 (two) times daily.   Yes Historical Provider, MD  atropine 1 %  ophthalmic solution Place 1  drop into the left eye 2 (two) times daily.   Yes Historical Provider, MD  bimatoprost (LUMIGAN) 0.01 % SOLN Place 1 drop into the right eye at bedtime.   Yes Historical Provider, MD  brimonidine (ALPHAGAN) 0.2 % ophthalmic solution Place 1 drop into the left eye 2 (two) times daily.   Yes Historical Provider, MD  Difluprednate (DUREZOL) 0.05 % EMUL Place 1 drop into the right eye 2 (two) times daily.   Yes Historical Provider, MD  dorzolamide-timolol (COSOPT) 22.3-6.8 MG/ML ophthalmic solution Place 1 drop into the right eye 2 (two) times daily.   Yes Historical Provider, MD  doxazosin (CARDURA) 8 MG tablet Take 8 mg by mouth at bedtime.    Yes Historical Provider, MD  finasteride (PROSCAR) 5 MG tablet Take 5 mg by mouth daily.   Yes Historical Provider, MD  lisinopril-hydrochlorothiazide (PRINZIDE,ZESTORETIC) 20-25 MG per tablet Take 1 tablet by mouth every morning.    Yes Historical Provider, MD  loteprednol (LOTEMAX) 0.5 % ophthalmic suspension Place 1 drop into the left eye 2 (two) times daily.   Yes Historical Provider, MD  moxifloxacin (VIGAMOX) 0.5 % ophthalmic solution Place 1 drop into the left eye 2 (two) times daily.   Yes Historical Provider, MD  prednisoLONE acetate (PRED FORTE) 1 % ophthalmic suspension Place 1 drop into the left eye 2 (two) times daily.   Yes Historical Provider, MD  methocarbamol (ROBAXIN) 500 MG tablet Take 1 tablet (500 mg total) by mouth every 6 (six) hours as needed. 08/08/12   Dutch Ing, PA-C  oxyCODONE (OXY IR/ROXICODONE) 5 MG immediate release tablet Take 1-2 tablets (5-10 mg total) by mouth every 3 (three) hours as needed. 08/08/12   Tyre Beaver Julien Girt, PA-C  rivaroxaban (XARELTO) 10 MG TABS tablet Take 1 tablet (10 mg total) by mouth daily with breakfast. Take Xarelto for two and a half more weeks, then discontinue Xarelto. Once the patient has completed the Xarelto, they may resume the 81 mg Aspirin. 08/08/12   Glennette Galster, PA-C  traMADol (ULTRAM) 50 MG tablet Take 1-2 tablets (50-100 mg total) by mouth every 6 (six) hours as needed (mild pain). 08/08/12   Rielynn Trulson Julien Girt, PA-C    Diet: Cardiac diet Activity:WBAT Follow-up:in 2 weeks Disposition - Home Discharged Condition: Improved       Discharge Orders   Future Orders Complete By Expires     Call MD / Call 911  As directed     Comments:      If you experience chest pain or shortness of breath, CALL 911 and be transported to the hospital emergency room.  If you develope a fever above 101 F, pus (white drainage) or increased drainage or redness at the wound, or calf pain, call your surgeon's office.    Change dressing  As directed     Comments:      Change dressing daily with sterile 4 x 4 inch gauze dressing and apply TED hose. Do not submerge the incision under water.    Constipation Prevention  As directed     Comments:      Drink plenty of fluids.  Prune juice may be helpful.  You may use a stool softener, such as Colace (over the counter) 100 mg twice a day.  Use MiraLax (over the counter) for constipation as needed.    Diet - low sodium heart healthy  As directed     Discharge instructions  As directed     Comments:  Pick up stool softner and laxative for home. Do not submerge incision under water. May shower. Continue to use ice for pain and swelling from surgery.  Take Xarelto for two and a half more weeks, then discontinue Xarelto. Once the patient has completed the Xarelto, they may resume the 81 mg Aspirin.  Leave foley to gravity.  Will have a nurse come out Monday to remove foley.    Do not put a pillow under the knee. Place it under the heel.  As directed     Do not sit on low chairs, stoools or toilet seats, as it may be difficult to get up from low surfaces  As directed     Driving restrictions  As directed     Comments:      No driving until released by the physician.    Increase activity slowly as tolerated  As  directed     Lifting restrictions  As directed     Comments:      No lifting until released by the physician.    Patient may shower  As directed     Comments:      You may shower without a dressing once there is no drainage.  Do not wash over the wound.  If drainage remains, do not shower until drainage stops.    TED hose  As directed     Comments:      Use stockings (TED hose) for 3 weeks on both leg(s).  You may remove them at night for sleeping.    Weight bearing as tolerated  As directed         Medication List    STOP taking these medications       aspirin 81 MG tablet     JOINT SUPPORT COMPLEX PO     multivitamin tablet      TAKE these medications       atenolol 50 MG tablet  Commonly known as:  TENORMIN  Take 50 mg by mouth 2 (two) times daily.     atropine 1 % ophthalmic solution  Place 1 drop into the left eye 2 (two) times daily.     bimatoprost 0.01 % Soln  Commonly known as:  LUMIGAN  Place 1 drop into the right eye at bedtime.     brimonidine 0.2 % ophthalmic solution  Commonly known as:  ALPHAGAN  Place 1 drop into the left eye 2 (two) times daily.     dorzolamide-timolol 22.3-6.8 MG/ML ophthalmic solution  Commonly known as:  COSOPT  Place 1 drop into the right eye 2 (two) times daily.     doxazosin 8 MG tablet  Commonly known as:  CARDURA  Take 8 mg by mouth at bedtime.     DUREZOL 0.05 % Emul  Generic drug:  Difluprednate  Place 1 drop into the right eye 2 (two) times daily.     finasteride 5 MG tablet  Commonly known as:  PROSCAR  Take 5 mg by mouth daily.     lisinopril-hydrochlorothiazide 20-25 MG per tablet  Commonly known as:  PRINZIDE,ZESTORETIC  Take 1 tablet by mouth every morning.     loteprednol 0.5 % ophthalmic suspension  Commonly known as:  LOTEMAX  Place 1 drop into the left eye 2 (two) times daily.     methocarbamol 500 MG tablet  Commonly known as:  ROBAXIN  Take 1 tablet (500 mg total) by mouth every 6 (six) hours  as needed.  moxifloxacin 0.5 % ophthalmic solution  Commonly known as:  VIGAMOX  Place 1 drop into the left eye 2 (two) times daily.     oxyCODONE 5 MG immediate release tablet  Commonly known as:  Oxy IR/ROXICODONE  Take 1-2 tablets (5-10 mg total) by mouth every 3 (three) hours as needed.     prednisoLONE acetate 1 % ophthalmic suspension  Commonly known as:  PRED FORTE  Place 1 drop into the left eye 2 (two) times daily.     rivaroxaban 10 MG Tabs tablet  Commonly known as:  XARELTO  - Take 1 tablet (10 mg total) by mouth daily with breakfast. Take Xarelto for two and a half more weeks, then discontinue Xarelto.  - Once the patient has completed the Xarelto, they may resume the 81 mg Aspirin.     traMADol 50 MG tablet  Commonly known as:  ULTRAM  Take 1-2 tablets (50-100 mg total) by mouth every 6 (six) hours as needed (mild pain).       Follow-up Information   Follow up with Loanne Drilling, MD. Schedule an appointment as soon as possible for a visit on 08/20/2012.   Contact information:   [address], [address] [place] [address] [phone]       Signed: Patrica Duel 08/28/2012, 8:52 PM

## 2012-08-09 NOTE — Progress Notes (Signed)
Physical Therapy Treatment Patient Details Name: [name] MRN: [id number] DOB: 1935-09-21 Today's Date: 08/09/2012 Time: 1191-4782 PT Time Calculation (min): 38 min  PT Assessment / Plan / Recommendation Comments on Treatment Session  POD #4 L TKA. Reviewed exercises with pt and daughter, instructed pt/daughter in stairs, pt ambulated 34' with RW. OK to DC home from PT standpoint. HHPT recommended.     Follow Up Recommendations  Home health PT;Supervision for mobility/OOB     Does the patient have the potential to tolerate intense rehabilitation     Barriers to Discharge        Equipment Recommendations       Recommendations for Other Services    Frequency 7X/week   Plan Discharge plan remains appropriate;Frequency remains appropriate    Precautions / Restrictions Precautions Precautions: Knee;Fall Precaution Comments: Instructed pt on KI use for amb Required Braces or Orthoses: Knee Immobilizer - Left Knee Immobilizer - Left: Discontinue once straight leg raise with < 10 degree lag Restrictions Weight Bearing Restrictions: No LLE Weight Bearing: Weight bearing as tolerated   Pertinent Vitals/Pain *pt reports, "no pain, its just sore" L knee Premedicated, ice applied after PT**    Mobility  Bed Mobility Bed Mobility: Supine to Sit;Sit to Supine Supine to Sit: 4: Min assist Details for Bed Mobility Assistance: assistance needed for L LE and increased time Transfers Transfers: Sit to Stand;Stand to Sit Sit to Stand: From bed;4: Min guard;With armrests;With upper extremity assist Stand to Sit: 4: Min guard;To chair/3-in-1;With armrests Details for Transfer Assistance: VCs for hand placemtn.  Pt tends to sit uncontrolled. Ambulation/Gait Ambulation/Gait Assistance: 4: Min guard Ambulation Distance (Feet): 80 Feet Assistive device: Rolling walker Gait Pattern: Step-to pattern;Decreased stance time - left Gait velocity: decreased General Gait Details: VCs to  correct flexed neck Stairs: Yes Stairs Assistance: 4: Min assist Stairs Assistance Details (indicate cue type and reason): 1 step x 3 trials (pt has 1STE at front door) Stair Management Technique: Backwards;No rails;With walker Wheelchair Mobility Wheelchair Mobility: No    Exercises Total Joint Exercises Ankle Circles/Pumps: AROM;Both;10 reps Quad Sets: AROM;Both;10 reps Short Arc QuadBarbaraann [name];Left;10 reps;Supine Heel Slides: AAROM;Left;10 reps Hip ABduction/ADduction: Left;10 reps;AAROM Straight Leg Raises: AAROM;Left;10 reps Goniometric ROM: 60* L knee flexion AAROM   PT Diagnosis:    PT Problem List:   PT Treatment Interventions:     PT Goals Acute Rehab PT Goals PT Goal Formulation: With patient/family Time For Goal Achievement: 08/13/12 Potential to Achieve Goals: Good Pt will go Supine/Side to Sit: with supervision PT Goal: Supine/Side to Sit - Progress: Progressing toward goal Pt will go Sit to Stand: with supervision PT Goal: Sit to Stand - Progress: Progressing toward goal Pt will Ambulate: 51 - 150 feet;with supervision;with rolling walker PT Goal: Ambulate - Progress: Progressing toward goal Pt will Go Up / Down Stairs: with min assist;with least restrictive assistive device;3-5 stairs;with rail(s) PT Goal: Up/Down Stairs - Progress: Met  Visit Information  Last PT Received On: 08/09/12    Subjective Data  Subjective: My doesn't have pain, its just sore.  Patient Stated Goal: to work in yard   [employer] Arousal/Alertness: Awake/alert Behavior During Therapy: WFL for tasks assessed/performed Overall Cognitive Status: Within Functional Limits for tasks assessed    Balance     End of Session PT - End of Session Equipment Utilized During Treatment: Gait belt;Left knee immobilizer Activity Tolerance: Patient limited by fatigue Patient left: in bed;with family/visitor present;with call bell/phone within reach   GP  [name]  [name] 08/09/2012, 11:23 AM [phone]

## 2012-08-09 NOTE — Progress Notes (Signed)
   Subjective: 4 Days Post-Op Procedure(s) (LRB): LEFT TOTAL KNEE ARTHROPLASTY (Left) Patient reports pain as mild.  Doing much better today Patient is doing better eating and had 2 BM's yesterday. No longer nauseated. Had elevated temp last night but afebrile now. No constitutional symptoms. Plan is to go Home after hospital stay.  Objective: Vital signs in last 24 hours: Temp:  [99.7 F (37.6 C)-102.9 F (39.4 C)] 99.7 F (37.6 C) (06/13 0645) Pulse Rate:  [67-70] 67 (06/13 0645) Resp:  [16] 16 (06/13 0645) BP: (133-160)/(59-71) 135/64 mmHg (06/13 0645) SpO2:  [96 %-99 %] 98 % (06/13 0645)  Intake/Output from previous day:  Intake/Output Summary (Last 24 hours) at 08/09/12 0754 Last data filed at 08/09/12 0655  Gross per 24 hour  Intake 243.33 ml  Output   1200 ml  Net -956.67 ml    Intake/Output this shift:    Labs:  Recent Labs  08/07/12 0423 08/08/12 0447  HGB 10.0* 8.9*    Recent Labs  08/07/12 0423 08/08/12 0447  WBC 9.6 8.8  RBC 3.14* 2.86*  HCT 27.6* 25.2*  PLT 122* 105*    Recent Labs  08/07/12 0423  NA 133*  K 3.4*  CL 100  CO2 27  BUN 9  CREATININE 1.10  GLUCOSE 136*  CALCIUM 8.3*   No results found for this basename: LABPT, INR,  in the last 72 hours  EXAM General - Patient is Alert, Appropriate and Oriented Extremity - Neurologically intact ABD soft Neurovascular intact Incision: dressing C/D/I No cellulitis present Compartment soft Dressing/Incision - clean, dry, no drainage Motor Function - intact, moving foot and toes well on exam.   Past Medical History  Diagnosis Date  . HTN (hypertension)   . Otitis media     with intermittent vertigo  . DJD (degenerative joint disease)   . Glaucoma   . Complication of anesthesia     HARD TO WAKE UP  . Weakness     of neck muscles  . Constipation     Assessment/Plan: 4 Days Post-Op Procedure(s) (LRB): LEFT TOTAL KNEE ARTHROPLASTY (Left) Principal Problem:   OA  (osteoarthritis) of knee Active Problems:   Postoperative anemia due to acute blood loss   Discharge home with home health Keep Foley in until Monday at which time it will be discontinued by home health nursing.  DVT Prophylaxis - Xarelto Weight-Bearing as tolerated to left leg  Grenda Lora V 08/09/2012, 7:54 AM

## 2012-09-06 ENCOUNTER — Other Ambulatory Visit: Payer: Self-pay | Admitting: Internal Medicine

## 2012-09-06 NOTE — Telephone Encounter (Signed)
Not [facility] pt; please send back to the pharmacy  Thanks

## 2012-09-10 ENCOUNTER — Other Ambulatory Visit: Payer: Self-pay | Admitting: Internal Medicine

## 2013-02-10 ENCOUNTER — Other Ambulatory Visit: Payer: Self-pay | Admitting: Orthopedic Surgery

## 2013-05-06 ENCOUNTER — Emergency Department (HOSPITAL_BASED_OUTPATIENT_CLINIC_OR_DEPARTMENT_OTHER)
Admission: EM | Admit: 2013-05-06 | Discharge: 2013-05-06 | Disposition: A | Discharge status: Home / Self Care | Payer: Medicare Other | Attending: Emergency Medicine | Admitting: Emergency Medicine

## 2013-05-06 ENCOUNTER — Encounter (HOSPITAL_BASED_OUTPATIENT_CLINIC_OR_DEPARTMENT_OTHER): Payer: Self-pay | Admitting: Emergency Medicine

## 2013-05-06 DIAGNOSIS — Z8739 Personal history of other diseases of the musculoskeletal system and connective tissue: Secondary | ICD-10-CM | POA: Insufficient documentation

## 2013-05-06 DIAGNOSIS — Z792 Long term (current) use of antibiotics: Secondary | ICD-10-CM | POA: Insufficient documentation

## 2013-05-06 DIAGNOSIS — IMO0002 Reserved for concepts with insufficient information to code with codable children: Secondary | ICD-10-CM | POA: Insufficient documentation

## 2013-05-06 DIAGNOSIS — H409 Unspecified glaucoma: Secondary | ICD-10-CM | POA: Insufficient documentation

## 2013-05-06 DIAGNOSIS — T464X5A Adverse effect of angiotensin-converting-enzyme inhibitors, initial encounter: Secondary | ICD-10-CM

## 2013-05-06 DIAGNOSIS — T4995XA Adverse effect of unspecified topical agent, initial encounter: Secondary | ICD-10-CM | POA: Insufficient documentation

## 2013-05-06 DIAGNOSIS — Z8719 Personal history of other diseases of the digestive system: Secondary | ICD-10-CM | POA: Insufficient documentation

## 2013-05-06 DIAGNOSIS — T783XXA Angioneurotic edema, initial encounter: Secondary | ICD-10-CM | POA: Insufficient documentation

## 2013-05-06 DIAGNOSIS — Z79899 Other long term (current) drug therapy: Secondary | ICD-10-CM | POA: Insufficient documentation

## 2013-05-06 DIAGNOSIS — I10 Essential (primary) hypertension: Secondary | ICD-10-CM | POA: Insufficient documentation

## 2013-05-06 MED ORDER — FAMOTIDINE 20 MG PO TABS
20.0000 mg | ORAL_TABLET | Freq: Once | ORAL | Status: AC
  Administered 2013-05-06: 20 mg via ORAL

## 2013-05-06 MED ORDER — FAMOTIDINE 20 MG PO TABS: 20.0000 mg | ORAL_TABLET | Freq: Two times a day (BID) | ORAL | Status: DC

## 2013-05-06 MED ORDER — FAMOTIDINE 20 MG PO TABS
ORAL_TABLET | ORAL | Status: AC
  Filled 2013-05-06: qty 1

## 2013-05-06 MED ORDER — DIPHENHYDRAMINE HCL 25 MG PO CAPS
ORAL_CAPSULE | ORAL | Status: AC
  Administered 2013-05-06: 25 mg
  Filled 2013-05-06: qty 1

## 2013-05-06 MED ORDER — DEXAMETHASONE SODIUM PHOSPHATE 10 MG/ML IJ SOLN
INTRAMUSCULAR | Status: AC
  Administered 2013-05-06: 10 mg via INTRAMUSCULAR
  Filled 2013-05-06: qty 1

## 2013-05-06 MED ORDER — DIPHENHYDRAMINE HCL 50 MG/ML IJ SOLN: 25.0000 mg | Freq: Once | INTRAMUSCULAR | Status: DC

## 2013-05-06 MED ORDER — PREDNISONE 20 MG PO TABS: ORAL_TABLET | ORAL | Status: DC

## 2013-05-06 MED ORDER — DEXAMETHASONE SODIUM PHOSPHATE 10 MG/ML IJ SOLN
10.0000 mg | Freq: Once | INTRAMUSCULAR | Status: AC
  Administered 2013-05-06: 10 mg via INTRAMUSCULAR

## 2013-05-06 NOTE — Discharge Instructions (Signed)
Angioedema Angioedema is a sudden swelling of tissues, often of the skin. It can occur on the face or genitals or in the abdomen or other body parts. The swelling usually develops over a short period and gets better in 24 to 48 hours. It often begins during the night and is found when the person wakes up. The person may also get red, itchy patches of skin (hives). Angioedema can be dangerous if it involves swelling of the air passages.  Depending on the cause, episodes of angioedema may only happen once, come back in unpredictable patterns, or repeat for several years and then gradually fade away.  CAUSES  Angioedema can be caused by an allergic reaction to various triggers. It can also result from nonallergic causes, including reactions to drugs, immune system disorders, viral infections, or an abnormal gene that is passed to you from your parents (hereditary). For some people with angioedema, the cause is unknown.  Some things that can trigger angioedema include:   Foods.   Medicines, such as ACE inhibitors, ARBs, nonsteroidal anti-inflammatory agents, or estrogen.   Latex.   Animal saliva.   Insect stings.   Dyes used in X-rays.   Mild injury.   Dental work.  Surgery.  Stress.   Sudden changes in temperature.   Exercise. SIGNS AND SYMPTOMS   Swelling of the skin.  Hives. If these are present, there is also intense itching.  Redness in the affected area.   Pain in the affected area.  Swollen lips or tongue.  Breathing problems. This may happen if the air passages swell.  Wheezing. If internal organs are involved, there may be:   Nausea.   Abdominal pain.   Vomiting.   Difficulty swallowing.   Difficulty passing urine. DIAGNOSIS   Your health care provider will examine the affected area and take a medical and family history.  Various tests may be done to help determine the cause. Tests may include:  Allergy skin tests to see if the problem  is an allergic reaction.   Blood tests to check for hereditary angioedema.   Tests to check for underlying diseases that could cause the condition.   A review of your medicines, including over the counter medicines, may be done. TREATMENT  Treatment will depend on the cause of the angioedema. Possible treatments include:   Removal of anything that triggered the condition (such as stopping certain medicines).   Medicines to treat symptoms or prevent attacks. Medicines given may include:   Antihistamines.   Epinephrine injection.   Steroids.   Hospitalization may be required for severe attacks. If the air passages are affected, it can be an emergency. Tubes may need to be placed to keep the airway open. HOME CARE INSTRUCTIONS   Only take over-the-counter or prescription medicines as directed by your health care provider.  If you were given medicines for emergency allergy treatment, always carry them with you.  Wear a medical bracelet as directed by your health care provider.   Avoid known triggers. SEEK MEDICAL CARE IF:   You have repeat attacks of angioedema.   Your attacks are more frequent or more severe despite preventive measures.   You have hereditary angioedema and are considering having children. It is important to discuss the risks of passing the condition on to your children with your health care provider. SEEK IMMEDIATE MEDICAL CARE IF:   You have severe swelling of the mouth, tongue, or lips.  You have difficulty breathing.   You have difficulty swallowing.     You faint. MAKE SURE YOU:  Understand these instructions.  Will watch your condition.  Will get help right away if you are not doing well or get worse. Document Released: 04/24/2001 Document Revised: 12/04/2012 Document Reviewed: 10/07/2012 [employer] Patient Information 2014 [employer].  

## 2013-05-06 NOTE — ED Notes (Signed)
Pt complains of upper lip swelling.  Pt had same thing happen to lower lip last night saw PCP today and was told that he was probably allergic to the dye on the zithromax pills.  Denies difficulty breathing.

## 2013-05-06 NOTE — ED Notes (Signed)
Pt also complains of chest and nasal congestion with sore throat.

## 2013-05-06 NOTE — ED Provider Notes (Signed)
CSN: [id number]     Arrival date & time 05/06/13  0041 History   First MD Initiated Contact with Patient 05/06/13 0056     Chief Complaint  Patient presents with  . Oral Swelling  . Allergic Reaction     (Consider location/radiation/quality/duration/timing/severity/associated sxs/prior Treatment) Patient is a 78 y.o. male presenting with allergic reaction. The history is provided by the patient. No language interpreter was used.  Allergic Reaction Presenting symptoms: swelling   Presenting symptoms: no difficulty breathing, no difficulty swallowing, no itching and no wheezing   Swelling:    Location:  Face   Onset quality:  Sudden   Duration:  1 day   Timing:  Constant (first upper lip then lower lip)   Progression:  Unchanged   Chronicity:  New Severity:  Moderate Prior allergic episodes:  No prior episodes Context: medications   Context: no animal exposure   Relieved by:  Nothing Worsened by:  Nothing tried Ineffective treatments:  None tried   Past Medical History  Diagnosis Date  . HTN (hypertension)   . Otitis media     with intermittent vertigo  . DJD (degenerative joint disease)   . Glaucoma   . Complication of anesthesia     HARD TO WAKE UP  . Weakness     of neck muscles  . Constipation    Past Surgical History  Procedure Laterality Date  . Eye surgery  06/10/2009 / 07/17/12    glaucoma   . Back surgery  2000  . Carpal tunnell  1970'S  . Tonsillectomy    . Total knee arthroplasty Left 08/05/2012    Procedure: LEFT TOTAL KNEE ARTHROPLASTY;  Surgeon: Loanne Drilling, MD;  Location: [facility];  Service: Orthopedics;  Laterality: Left;   No family history on file. History  Substance Use Topics  . Smoking status: Never Smoker   . Smokeless tobacco: Not on file  . Alcohol Use: No    Review of Systems  HENT: Negative for trouble swallowing.   Respiratory: Negative for wheezing.   Skin: Negative for itching.  All other systems reviewed and are  negative.      Allergies  Bextra; Celebrex; Chlorpromazine; Ciprofloxacin; Flomax; Hyoscyamine; Imuran; Ivp dye; and Methotrexate derivatives  Home Medications   Current Outpatient Rx  Name  Route  Sig  Dispense  Refill  . atenolol (TENORMIN) 50 MG tablet   Oral   Take 50 mg by mouth 2 (two) times daily.         Marland Kitchen atropine 1 % ophthalmic solution   Left Eye   Place 1 drop into the left eye 2 (two) times daily.         . bimatoprost (LUMIGAN) 0.01 % SOLN   Right Eye   Place 1 drop into the right eye at bedtime.         . brimonidine (ALPHAGAN) 0.2 % ophthalmic solution   Left Eye   Place 1 drop into the left eye 2 (two) times daily.         . Difluprednate (DUREZOL) 0.05 % EMUL   Right Eye   Place 1 drop into the right eye 2 (two) times daily.         . dorzolamide-timolol (COSOPT) 22.3-6.8 MG/ML ophthalmic solution   Right Eye   Place 1 drop into the right eye 2 (two) times daily.         Marland Kitchen doxazosin (CARDURA) 8 MG tablet   Oral   Take 8 mg  by mouth at bedtime.          . finasteride (PROSCAR) 5 MG tablet   Oral   Take 5 mg by mouth daily.         Marland Kitchen lisinopril-hydrochlorothiazide (PRINZIDE,ZESTORETIC) 20-25 MG per tablet   Oral   Take 1 tablet by mouth every morning.          . loteprednol (LOTEMAX) 0.5 % ophthalmic suspension   Left Eye   Place 1 drop into the left eye 2 (two) times daily.         . methocarbamol (ROBAXIN) 500 MG tablet   Oral   Take 1 tablet (500 mg total) by mouth every 6 (six) hours as needed.   80 tablet   0   . moxifloxacin (VIGAMOX) 0.5 % ophthalmic solution   Left Eye   Place 1 drop into the left eye 2 (two) times daily.         Marland Kitchen oxyCODONE (OXY IR/ROXICODONE) 5 MG immediate release tablet   Oral   Take 1-2 tablets (5-10 mg total) by mouth every 3 (three) hours as needed.   80 tablet   0   . prednisoLONE acetate (PRED FORTE) 1 % ophthalmic suspension   Left Eye   Place 1 drop into the left eye 2  (two) times daily.         . rivaroxaban (XARELTO) 10 MG TABS tablet   Oral   Take 1 tablet (10 mg total) by mouth daily with breakfast. Take Xarelto for two and a half more weeks, then discontinue Xarelto. Once the patient has completed the Xarelto, they may resume the 81 mg Aspirin.   18 tablet   0   . traMADol (ULTRAM) 50 MG tablet   Oral   Take 1-2 tablets (50-100 mg total) by mouth every 6 (six) hours as needed (mild pain).   60 tablet   0    BP 193/74  Pulse 64  Temp(Src) 98.4 F (36.9 C) (Oral)  Resp 18  Ht 6\' 3"  (1.905 m)  Wt 214 lb (97.07 kg)  BMI 26.75 kg/m2  SpO2 100% Physical Exam  Constitutional: He is oriented to person, place, and time. He appears well-developed and well-nourished. No distress.  HENT:  Head: Normocephalic and atraumatic.  Mouth/Throat: Oropharynx is clear and moist.  Swelling of the upper lip mild.  No swelling of the tongue or the uvula  Eyes: Conjunctivae are normal. Pupils are equal, round, and reactive to light.  Neck: Normal range of motion. Neck supple.  Cardiovascular: Normal rate, regular rhythm and intact distal pulses.   Pulmonary/Chest: Effort normal and breath sounds normal. No stridor. He has no wheezes. He has no rales.  Abdominal: Soft. Bowel sounds are normal. There is no tenderness. There is no rebound and no guarding.  Musculoskeletal: Normal range of motion.  Lymphadenopathy:    He has no cervical adenopathy.  Neurological: He is alert and oriented to person, place, and time.  Skin: Skin is warm and dry. No rash noted.  Psychiatric: He has a normal mood and affect.    ED Course  Procedures (including critical care time) Labs Review Labs Reviewed - No data to display Imaging Review No results found.   EKG Interpretation None      MDM   Final diagnoses:  None  Doing better post medication.  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  Stop your lisinopril.  List as allergy.  Call your doctor in the am and tell him that you must stop drug and change to a new medication.  Return for worsening symptoms of swelling shortness of breath difficulty swallowing or any concerns.      Jasmine AweApril K Royann Wildasin-Rasch, MD 05/06/13 [phone]

## 2013-07-07 ENCOUNTER — Other Ambulatory Visit: Payer: Self-pay | Admitting: Internal Medicine

## 2013-07-07 ENCOUNTER — Ambulatory Visit
Admission: RE | Admit: 2013-07-07 | Discharge: 2013-07-07 | Disposition: A | Discharge status: Home / Self Care | Payer: Medicare Other | Source: Ambulatory Visit | Attending: Internal Medicine | Admitting: Internal Medicine

## 2013-07-07 DIAGNOSIS — R058 Other specified cough: Secondary | ICD-10-CM

## 2013-07-07 DIAGNOSIS — R079 Chest pain, unspecified: Secondary | ICD-10-CM

## 2013-07-07 DIAGNOSIS — J209 Acute bronchitis, unspecified: Secondary | ICD-10-CM

## 2013-07-07 DIAGNOSIS — R05 Cough: Secondary | ICD-10-CM

## 2013-10-03 ENCOUNTER — Encounter (HOSPITAL_COMMUNITY): Payer: Self-pay | Admitting: Emergency Medicine

## 2013-10-03 ENCOUNTER — Emergency Department (HOSPITAL_COMMUNITY)
Admission: EM | Admit: 2013-10-03 | Discharge: 2013-10-04 | Disposition: A | Discharge status: Home / Self Care | Payer: Medicare Other | Attending: Emergency Medicine | Admitting: Emergency Medicine

## 2013-10-03 DIAGNOSIS — R296 Repeated falls: Secondary | ICD-10-CM | POA: Insufficient documentation

## 2013-10-03 DIAGNOSIS — M25519 Pain in unspecified shoulder: Secondary | ICD-10-CM | POA: Insufficient documentation

## 2013-10-03 DIAGNOSIS — I10 Essential (primary) hypertension: Secondary | ICD-10-CM | POA: Diagnosis not present

## 2013-10-03 DIAGNOSIS — Y9289 Other specified places as the place of occurrence of the external cause: Secondary | ICD-10-CM | POA: Diagnosis not present

## 2013-10-03 DIAGNOSIS — M542 Cervicalgia: Secondary | ICD-10-CM | POA: Diagnosis not present

## 2013-10-03 DIAGNOSIS — Y9389 Activity, other specified: Secondary | ICD-10-CM | POA: Insufficient documentation

## 2013-10-03 DIAGNOSIS — Z792 Long term (current) use of antibiotics: Secondary | ICD-10-CM | POA: Insufficient documentation

## 2013-10-03 DIAGNOSIS — Z880 Allergy status to penicillin: Secondary | ICD-10-CM | POA: Diagnosis not present

## 2013-10-03 DIAGNOSIS — IMO0002 Reserved for concepts with insufficient information to code with codable children: Secondary | ICD-10-CM | POA: Insufficient documentation

## 2013-10-03 DIAGNOSIS — Z8669 Personal history of other diseases of the nervous system and sense organs: Secondary | ICD-10-CM | POA: Insufficient documentation

## 2013-10-03 DIAGNOSIS — M5412 Radiculopathy, cervical region: Secondary | ICD-10-CM

## 2013-10-03 DIAGNOSIS — Z8719 Personal history of other diseases of the digestive system: Secondary | ICD-10-CM | POA: Diagnosis not present

## 2013-10-03 DIAGNOSIS — W19XXXA Unspecified fall, initial encounter: Secondary | ICD-10-CM

## 2013-10-03 DIAGNOSIS — Z79899 Other long term (current) drug therapy: Secondary | ICD-10-CM | POA: Diagnosis not present

## 2013-10-03 MED ORDER — OXYCODONE-ACETAMINOPHEN 5-325 MG PO TABS
1.0000 | ORAL_TABLET | Freq: Once | ORAL | Status: AC
  Administered 2013-10-04: 1 via ORAL
  Filled 2013-10-03: qty 1

## 2013-10-03 NOTE — ED Provider Notes (Signed)
CSN: [id number]     Arrival date & time 10/03/13  2145 History   First MD Initiated Contact with Patient 10/03/13 2325     Chief Complaint  Patient presents with  . Fall     (Consider location/radiation/quality/duration/timing/severity/associated sxs/prior Treatment) HPI  This is a 52 rolled male with a history of hypertension, degenerative joint disease he presents following a fall. Patient states that he was pulling something out of the ground and fell backwards. He was on a grassy surface. Denies hitting his head or loss of consciousness. He reports mostly right shoulder, back, and neck pain. He states that his pain in his right shoulder and arm is worse with movement of his neck. He states when he is not moving his pain is 7/10 when he is moving his pain is 10 out of 10. He took 2 Tylenol with minimal relief. He takes a baby aspirin daily but otherwise does not take any anticoagulants. Shortness of breath, chest pain, abdominal pain. He has been ambulatory.  Past Medical History  Diagnosis Date  . HTN (hypertension)   . Otitis media     with intermittent vertigo  . DJD (degenerative joint disease)   . Glaucoma   . Complication of anesthesia     HARD TO WAKE UP  . Weakness     of neck muscles  . Constipation    Past Surgical History  Procedure Laterality Date  . Eye surgery  06/10/2009 / 07/17/12    glaucoma   . Back surgery  2000  . Carpal tunnell  1970'S  . Tonsillectomy    . Total knee arthroplasty Left 08/05/2012    Procedure: LEFT TOTAL KNEE ARTHROPLASTY;  Surgeon: Loanne Drilling, MD;  Location: [facility];  Service: Orthopedics;  Laterality: Left;   No family history on file. History  Substance Use Topics  . Smoking status: Never Smoker   . Smokeless tobacco: Not on file  . Alcohol Use: No    Review of Systems  Constitutional: Negative.  Negative for fever.  Respiratory: Negative.  Negative for chest tightness and shortness of breath.   Cardiovascular: Negative.   Negative for chest pain.  Gastrointestinal: Negative.  Negative for nausea, vomiting and abdominal pain.  Genitourinary: Negative.   Musculoskeletal: Positive for back pain and neck pain.       Right shoulder pain  Skin: Negative for wound.  Neurological: Negative for headaches.  All other systems reviewed and are negative.     Allergies  Bextra; Celebrex; Chlorpromazine; Ciprofloxacin; Flomax; Hyoscyamine; Imuran; Ivp dye; Methotrexate derivatives; and Penicillins  Home Medications   Prior to Admission medications   Medication Sig Start Date End Date Taking? Authorizing Provider  acetaminophen (TYLENOL) 500 MG tablet Take 1,000 mg by mouth every 6 (six) hours as needed.   Yes Historical Provider, MD  atenolol (TENORMIN) 50 MG tablet Take 50 mg by mouth 2 (two) times daily.   Yes Historical Provider, MD  atropine 1 % ophthalmic solution Place 1 drop into the left eye 2 (two) times daily.   Yes Historical Provider, MD  bimatoprost (LUMIGAN) 0.01 % SOLN Place 1 drop into the right eye at bedtime.   Yes Historical Provider, MD  brimonidine (ALPHAGAN) 0.2 % ophthalmic solution Place 1 drop into the left eye 2 (two) times daily.   Yes Historical Provider, MD  Difluprednate (DUREZOL) 0.05 % EMUL Place 1 drop into the right eye 2 (two) times daily.   Yes Historical Provider, MD  dorzolamide-timolol (COSOPT) 22.3-6.8  MG/ML ophthalmic solution Place 1 drop into the right eye 2 (two) times daily.   Yes Historical Provider, MD  doxazosin (CARDURA) 8 MG tablet Take 8 mg by mouth at bedtime.    Yes Historical Provider, MD  finasteride (PROSCAR) 5 MG tablet Take 5 mg by mouth daily.   Yes Historical Provider, MD  losartan-hydrochlorothiazide (HYZAAR) 100-25 MG per tablet Take 1,500 tablets by mouth daily.   Yes Historical Provider, MD  loteprednol (LOTEMAX) 0.5 % ophthalmic suspension Place 1 drop into the left eye 2 (two) times daily.   Yes Historical Provider, MD  moxifloxacin (VIGAMOX) 0.5 %  ophthalmic solution Place 1 drop into the left eye 2 (two) times daily.   Yes Historical Provider, MD  NON FORMULARY Take 1 tablet by mouth daily. Diamaxx    Muscle creatine   Yes Historical Provider, MD  NON FORMULARY Take 1 capsule by mouth daily. B, A, C   Plus   Yes Historical Provider, MD  NON FORMULARY Take 1 tablet by mouth daily. Eye see vitamin   Yes Historical Provider, MD  prednisoLONE acetate (PRED FORTE) 1 % ophthalmic suspension Place 1 drop into the left eye 2 (two) times daily.   Yes Historical Provider, MD  Saw Palmetto, Serenoa repens, (SAW PALMETTO BERRIES PO) Take 1 capsule by mouth 2 (two) times daily.   Yes Historical Provider, MD  oxyCODONE-acetaminophen (PERCOCET/ROXICET) 5-325 MG per tablet Take 1 tablet by mouth every 6 (six) hours as needed for severe pain. 10/04/13   Shon Batonourtney F Horton, MD   BP 172/68  Pulse 51  Temp(Src) 98.2 F (36.8 C) (Oral)  Resp 16  Ht 6\' 4"  (1.93 m)  Wt 222 lb (100.699 kg)  BMI 27.03 kg/m2  SpO2 98% Physical Exam  Nursing note and vitals reviewed. Constitutional: He is oriented to person, place, and time. No distress.  elderly  HENT:  Head: Normocephalic and atraumatic.  Mouth/Throat: Oropharynx is clear and moist.  Neck:  C collar in place, midline c spine tenderness over c6, c7  Cardiovascular: Normal rate, regular rhythm and normal heart sounds.   No murmur heard. Pulmonary/Chest: Effort normal and breath sounds normal. No respiratory distress. He has no wheezes. He exhibits no tenderness.  Abdominal: Soft. Bowel sounds are normal. There is no tenderness. There is no rebound.  Musculoskeletal: He exhibits no edema.  NO midline back tenderness, step off or deformity, pain with ROM of the right shoulder, no obvious deformity, normal ROM of the hips bilaterally  Neurological: He is alert and oriented to person, place, and time. No cranial nerve deficit.  5/5 bilateral upper ext strength.  Skin: Skin is warm and dry.  Psychiatric: He  has a normal mood and affect.    ED Course  Procedures (including critical care time) Labs Review Labs Reviewed - No data to display  Imaging Review Dg Chest 2 View  10/04/2013   CLINICAL DATA:  Fall, right shoulder in chest pain  EXAM: CHEST  2 VIEW  COMPARISON:  07/07/2013  FINDINGS: The heart size and mediastinal contours are within normal limits. Both lungs are clear. The visualized skeletal structures are unremarkable.  IMPRESSION: No active cardiopulmonary disease.   Electronically Signed   By: Esperanza Heiraymond  Rubner M.D.   On: 10/04/2013 01:04   Dg Shoulder Right  10/04/2013   CLINICAL DATA:  Status post fall. Right shoulder pain and limited range of motion.  EXAM: RIGHT SHOULDER - 2+ VIEW  COMPARISON:  None.  FINDINGS: There is no  evidence of fracture or dislocation. The right humeral head is seated within the glenoid fossa. Mild degenerative change is noted at the right acromioclavicular joint, with mild inferior osteophyte formation, which corresponds to an increased risk for anatomic impingement.  No significant soft tissue abnormalities are seen. The visualized portions of the right lung are clear.  IMPRESSION: No evidence of fracture or dislocation.   Electronically Signed   By: Roanna Raider M.D.   On: 10/04/2013 01:04   Ct Cervical Spine Wo Contrast  10/04/2013   CLINICAL DATA:  fall  EXAM: CT CERVICAL SPINE WITHOUT CONTRAST  TECHNIQUE: Multidetector CT imaging of the cervical spine was performed without intravenous contrast. Multiplanar CT image reconstructions were also generated.  COMPARISON:  None.  FINDINGS: Normal alignment. No prevertebral soft tissue swelling. No fracture. Prominent C2-3 osteophyte. Mild C3-4 degenerative disc disease. Prominent C4-5 osteophyte. C5-6 demonstrates anterior and lateral nearly bridging osteophytes. C6-7 demonstrates bridging lateral and anterior osteophytes. At C7-T1, there is bridging osteophyte formation. There is also prominent osteophyte formation at  T1-2. At C6-7, C7-T1, and T1-T2, there are posterior osteophytes causing mild to moderate canal narrowing.  IMPRESSION: Extensive severe degenerative change without evidence of acute traumatic injury.   Electronically Signed   By: Esperanza Heir M.D.   On: 10/04/2013 00:59     EKG Interpretation   Date/Time:  Friday October 03 2013 21:52:11 EDT Ventricular Rate:  59 PR Interval:  218 QRS Duration: 84 QT Interval:  400 QTC Calculation: 396 R Axis:   43 Text Interpretation:  Sinus bradycardia with 1st degree A-V block Left  ventricular hypertrophy with repolarization abnormality Abnormal ECG  similar to prior Confirmed by HORTON  MD, Toni Amend ([id number]) on 10/03/2013  11:25:21 PM      MDM   Final diagnoses:  Fall, initial encounter  Cervical radicular pain   Patient presents after a fall from standing. He reports neck, back, and right shoulder pain. He is nontoxic on exam. Vital signs notable for blood pressure 176/67. Patient was given pain management.  X-rays obtained of the shoulder and CT scan of the cervical spine. X-ray is reassuring. CT scan shows extensive severe degenerative change without fracture. Patient is having symptoms most consistent with radicular pain. Given this and midline C-spine tenderness, patient will likely need an outpatient MRI to rule out ligamentous injury. He does have a primary physician. He is to maintain c-collar at all times until followup. This was communicated to the patient and his family who stated understanding. He will be given pain management.  After history, exam, and medical workup I feel the patient has been appropriately medically screened and is safe for discharge home. Pertinent diagnoses were discussed with the patient. Patient was given return precautions.    Shon Baton, MD 10/04/13 [phone]

## 2013-10-03 NOTE — ED Notes (Signed)
Pt also reporting shooting pain down L arm and numbness in L thumb since fall.

## 2013-10-03 NOTE — ED Notes (Signed)
Pt sts he was pulling something from ground and fell backwards. Denies loc. Pt c/o back and shoulder pain and pain in chest and neck.

## 2013-10-04 ENCOUNTER — Emergency Department (HOSPITAL_COMMUNITY): Payer: Medicare Other

## 2013-10-04 DIAGNOSIS — IMO0002 Reserved for concepts with insufficient information to code with codable children: Secondary | ICD-10-CM | POA: Diagnosis not present

## 2013-10-04 MED ORDER — OXYCODONE-ACETAMINOPHEN 5-325 MG PO TABS: 1.0000 | ORAL_TABLET | Freq: Four times a day (QID) | ORAL | Status: DC | PRN

## 2013-10-04 MED ORDER — ATENOLOL 25 MG PO TABS
50.0000 mg | ORAL_TABLET | Freq: Once | ORAL | Status: DC
  Filled 2013-10-04: qty 2

## 2013-10-04 MED ORDER — METHYLPREDNISOLONE (PAK) 4 MG PO TABS: ORAL_TABLET | ORAL | Status: DC

## 2013-10-04 NOTE — ED Notes (Signed)
Pt to CT

## 2013-10-04 NOTE — Discharge Instructions (Signed)
Cervical Radiculopathy Cervical radiculopathy happens when a nerve in the neck is pinched or bruised by a slipped (herniated) disk or by arthritic changes in the bones of the cervical spine. This can occur due to an injury or as part of the normal aging process. Pressure on the cervical nerves can cause pain or numbness that runs from your neck all the way down into your arm and fingers. CAUSES  There are many possible causes, including:  Injury.  Muscle tightness in the neck from overuse.  Swollen, painful joints (arthritis).  Breakdown or degeneration in the bones and joints of the spine (spondylosis) due to aging.  Bone spurs that may develop near the cervical nerves. SYMPTOMS  Symptoms include pain, weakness, or numbness in the affected arm and hand. Pain can be severe or irritating. Symptoms may be worse when extending or turning the neck. DIAGNOSIS  Your caregiver will ask about your symptoms and do a physical exam. He or she may test your strength and reflexes. X-rays, CT scans, and MRI scans may be needed in cases of injury or if the symptoms do not go away after a period of time. Electromyography (EMG) or nerve conduction testing may be done to study how your nerves and muscles are working. TREATMENT  Your caregiver may recommend certain exercises to help relieve your symptoms. Cervical radiculopathy can, and often does, get better with time and treatment. If your problems continue, treatment options may include:  Wearing a soft collar for short periods of time.  Physical therapy to strengthen the neck muscles.  Medicines, such as nonsteroidal anti-inflammatory drugs (NSAIDs), oral corticosteroids, or spinal injections.  Surgery. Different types of surgery may be done depending on the cause of your problems. HOME CARE INSTRUCTIONS   Put ice on the affected area.  Put ice in a plastic bag.  Place a towel between your skin and the bag.  Leave the ice on for 15-20 minutes,  03-04 times a day or as directed by your caregiver.  If ice does not help, you can try using heat. Take a warm shower or bath, or use a hot water bottle as directed by your caregiver.  You may try a gentle neck and shoulder massage.  Use a flat pillow when you sleep.  Only take over-the-counter or prescription medicines for pain, discomfort, or fever as directed by your caregiver.  If physical therapy was prescribed, follow your caregiver's directions.  If a soft collar was prescribed, use it as directed. SEEK IMMEDIATE MEDICAL CARE IF:   Your pain gets much worse and cannot be controlled with medicines.  You have weakness or numbness in your hand, arm, face, or leg.  You have a high fever or a stiff, rigid neck.  You lose bowel or bladder control (incontinence).  You have trouble with walking, balance, or speaking. MAKE SURE YOU:   Understand these instructions.  Will watch your condition.  Will get help right away if you are not doing well or get worse. Document Released: 11/08/2000 Document Revised: 05/08/2011 Document Reviewed: 09/27/2010 [facility] Patient Information 2015 [place], [place]. This information is not intended to replace advice given to you by your health care provider. Make sure you discuss any questions you have with your health care provider.  Cervical spine pain  You have pain in her cervical spine. CT scan is negative. However, given pain and radicular symptoms in the right arm, you need MRI to rule out ligamentous injury. You should maintain c-collar at all times until  you can followup with her primary physician on Monday and have an outpatient MRI  SEEK IMMEDIATE MEDICAL CARE IF:  You have difficulty breathing or leg swelling.  You have a fever.  You feel nauseous, vomit, or have abdominal pain.  You feel lightheaded or faint.  You develop chest pain, an abnormal heartbeat (palpitations), or a fast heart rate.  You develop bad smelling urine,  bedsores, or significant redness on your skin. Document Released: 02/03/2002 Document Revised: 05/08/2011 Document Reviewed: 05/30/2013 [facility] Patient Information 2015 [place], [place]. This information is not intended to replace advice given to you by your health care provider. Make sure you discuss any questions you have with your health care provider.   Cervical Collar A cervical collar is used to hold the head and neck still. This may be a soft, thick collar or a more rigid hard collar. This can keep your sore neck muscles from hurting. The collar also protects you in case a more serious injury of the neck is present and can not be seen yet on an x-ray. FOLLOW THESE INSTRUCTIONS FOR COLLAR USE:  Attach the Velcro tab in back.  Make it tight enough to support part of the weight of your chin.  Do not make it so tight that it hurts or makes it hard to breathe.  Wear it until you are comfortable without it or as instructed. HOME CARE INSTRUCTIONS   Ice packs to the neck or areas of pain approximately 03-04 times a day for 15-20 minutes while awake. Do this for 2 days. Ask your physician if you may remove the cervical collar for bathing or ice.  Do not remove any collar unless instructed by a caregiver. Ask if the collar may be removed for showering or eating.  Only take over-the-counter or prescription medicines for pain, discomfort, or fever as directed by your caregiver.  Do not drive a car until given permission by your caregiver.  Follow all instructions for follow-up with your caregiver. This includes any referrals, physical therapy, and rehabilitation. Any delay in obtaining necessary care could result in a delay or failure of the injury to heal properly. SEEK IMMEDIATE MEDICAL CARE IF:   You develop increasing pain.  You develop problems using your arms or legs or have tingling or other funny feelings in them.  You develop loss of strength in either of your arms or legs or  have difficulty walking.  You develop a fever or shaking chills.  You lose control of your bowels or bladder. MAKE SURE YOU:   Understand these instructions.  Will watch your condition.  Will get help right away if you are not doing well or get worse. Document Released: 11/06/2003 Document Revised: 05/08/2011 Document Reviewed: 10/07/2007 [facility] Patient Information 2015 [place], [place]. This information is not intended to replace advice given to you by your health care provider. Make sure you discuss any questions you have with your health care provider.

## 2013-10-04 NOTE — ED Notes (Signed)
Dr. Wilkie AyeHorton notified of pt BP. Pt missed evening dose of atenolol.

## 2013-10-08 ENCOUNTER — Emergency Department (HOSPITAL_BASED_OUTPATIENT_CLINIC_OR_DEPARTMENT_OTHER)
Admission: EM | Admit: 2013-10-08 | Discharge: 2013-10-08 | Disposition: A | Discharge status: Home / Self Care | Payer: Medicare Other | Attending: Emergency Medicine | Admitting: Emergency Medicine

## 2013-10-08 ENCOUNTER — Emergency Department (HOSPITAL_BASED_OUTPATIENT_CLINIC_OR_DEPARTMENT_OTHER): Payer: Medicare Other

## 2013-10-08 ENCOUNTER — Encounter (HOSPITAL_BASED_OUTPATIENT_CLINIC_OR_DEPARTMENT_OTHER): Payer: Self-pay | Admitting: Emergency Medicine

## 2013-10-08 DIAGNOSIS — Z8739 Personal history of other diseases of the musculoskeletal system and connective tissue: Secondary | ICD-10-CM | POA: Diagnosis not present

## 2013-10-08 DIAGNOSIS — Z8669 Personal history of other diseases of the nervous system and sense organs: Secondary | ICD-10-CM | POA: Diagnosis not present

## 2013-10-08 DIAGNOSIS — Z79899 Other long term (current) drug therapy: Secondary | ICD-10-CM | POA: Insufficient documentation

## 2013-10-08 DIAGNOSIS — H409 Unspecified glaucoma: Secondary | ICD-10-CM | POA: Diagnosis not present

## 2013-10-08 DIAGNOSIS — I498 Other specified cardiac arrhythmias: Secondary | ICD-10-CM | POA: Insufficient documentation

## 2013-10-08 DIAGNOSIS — R42 Dizziness and giddiness: Secondary | ICD-10-CM | POA: Insufficient documentation

## 2013-10-08 DIAGNOSIS — R001 Bradycardia, unspecified: Secondary | ICD-10-CM

## 2013-10-08 DIAGNOSIS — I10 Essential (primary) hypertension: Secondary | ICD-10-CM | POA: Diagnosis not present

## 2013-10-08 DIAGNOSIS — Z880 Allergy status to penicillin: Secondary | ICD-10-CM | POA: Insufficient documentation

## 2013-10-08 LAB — CBC WITH DIFFERENTIAL/PLATELET
Basophils Absolute: 0 10*3/uL (ref 0.0–0.1)
Basophils Relative: 0 % (ref 0–1)
EOS ABS: 0.2 10*3/uL (ref 0.0–0.7)
Eosinophils Relative: 4 % (ref 0–5)
HCT: 37.7 % — ABNORMAL LOW (ref 39.0–52.0)
Hemoglobin: 13.3 g/dL (ref 13.0–17.0)
Lymphocytes Relative: 33 % (ref 12–46)
Lymphs Abs: 1.4 10*3/uL (ref 0.7–4.0)
MCH: 31.3 pg (ref 26.0–34.0)
MCHC: 35.3 g/dL (ref 30.0–36.0)
MCV: 88.7 fL (ref 78.0–100.0)
Monocytes Absolute: 0.4 10*3/uL (ref 0.1–1.0)
Monocytes Relative: 10 % (ref 3–12)
Neutro Abs: 2.2 10*3/uL (ref 1.7–7.7)
Neutrophils Relative %: 53 % (ref 43–77)
PLATELETS: 133 10*3/uL — AB (ref 150–400)
RBC: 4.25 MIL/uL (ref 4.22–5.81)
RDW: 13.4 % (ref 11.5–15.5)
WBC: 4.2 10*3/uL (ref 4.0–10.5)

## 2013-10-08 LAB — BASIC METABOLIC PANEL
Anion gap: 12 (ref 5–15)
BUN: 13 mg/dL (ref 6–23)
CHLORIDE: 101 meq/L (ref 96–112)
CO2: 28 meq/L (ref 19–32)
CREATININE: 1.1 mg/dL (ref 0.50–1.35)
Calcium: 10.1 mg/dL (ref 8.4–10.5)
GFR calc non Af Amer: 63 mL/min — ABNORMAL LOW (ref >90)
GFR, EST AFRICAN AMERICAN: 73 mL/min — AB (ref >90)
Glucose, Bld: 107 mg/dL — ABNORMAL HIGH (ref 70–99)
Potassium: 3.8 mEq/L (ref 3.7–5.3)
SODIUM: 141 meq/L (ref 137–147)

## 2013-10-08 LAB — TROPONIN I: Troponin I: <0.3 ng/mL (ref <0.30)

## 2013-10-08 MED ORDER — MORPHINE SULFATE 4 MG/ML IJ SOLN
4.0000 mg | Freq: Once | INTRAMUSCULAR | Status: AC
  Administered 2013-10-08: 4 mg via INTRAVENOUS
  Filled 2013-10-08: qty 1

## 2013-10-08 MED ORDER — ONDANSETRON HCL 4 MG/2ML IJ SOLN
4.0000 mg | Freq: Once | INTRAMUSCULAR | Status: AC
  Administered 2013-10-08: 4 mg via INTRAVENOUS
  Filled 2013-10-08: qty 2

## 2013-10-08 MED ORDER — MORPHINE SULFATE 4 MG/ML IJ SOLN
6.0000 mg | Freq: Once | INTRAMUSCULAR | Status: DC
  Filled 2013-10-08: qty 2

## 2013-10-08 NOTE — ED Notes (Signed)
Patient transported to CT 

## 2013-10-08 NOTE — ED Notes (Signed)
MD at bedside. 

## 2013-10-08 NOTE — ED Notes (Signed)
Pt reports he was sent to ED by PMD for evaluation of dizziness, low heart rate and hypertension.  States he checked BP this am and it was 178/81 and HR 44.

## 2013-10-08 NOTE — ED Notes (Signed)
Vrinda Pickering, FNP at bedside 

## 2013-10-08 NOTE — Discharge Instructions (Signed)
Bradycardia °Bradycardia is a term for a heart rate (pulse) that, in adults, is slower than 60 beats per minute. A normal rate is 60 to 100 beats per minute. A heart rate below 60 beats per minute may be normal for some adults with healthy hearts. If the rate is too slow, the heart may have trouble pumping the volume of blood the body needs. If the heart rate gets too low, blood flow to the brain may be decreased and may make you feel lightheaded, dizzy, or faint. °The heart has a natural pacemaker in the top of the heart called the SA node (sinoatrial or sinus node). This pacemaker sends out regular electrical signals to the muscle of the heart, telling the heart muscle when to beat (contract). The electrical signal travels from the upper parts of the heart (atria) through the AV node (atrioventricular node), to the lower chambers of the heart (ventricles). The ventricles squeeze, pumping the blood from your heart to your lungs and to the rest of your body. °CAUSES  °· Problem with the heart's electrical system. °· Problem with the heart's natural pacemaker. °· Heart disease, damage, or infection. °· Medications. °· Problems with minerals and salts (electrolytes). °SYMPTOMS  °· Fainting (syncope). °· Fatigue and weakness. °· Shortness of breath (dyspnea). °· Chest pain (angina). °· Drowsiness. °· Confusion. °DIAGNOSIS  °· An electrocardiogram (ECG) can help your caregiver determine the type of slow heart rate you have. °· If the cause is not seen on an ECG, you may need to wear a heart monitor that records your heart rhythm for several hours or days. °· Blood tests. °TREATMENT  °· Electrolyte supplements. °· Medications. °· Withholding medication which is causing a slow heart rate. °· Pacemaker placement. °SEEK IMMEDIATE MEDICAL CARE IF:  °· You feel lightheaded or faint. °· You develop an irregular heart rate. °· You feel chest pain or have trouble breathing. °MAKE SURE YOU:  °· Understand these  instructions. °· Will watch your condition. °· Will get help right away if you are not doing well or get worse. °Document Released: 11/05/2001 Document Revised: 05/08/2011 Document Reviewed: 05/21/2013 °ExitCare® Patient Information ©2015 [employer]. This information is not intended to replace advice given to you by your health care provider. Make sure you discuss any questions you have with your health care provider. ° °

## 2013-10-08 NOTE — ED Provider Notes (Signed)
CSN: [id number]     Arrival date & time 10/08/13  1224 History   First MD Initiated Contact with Patient 10/08/13 1229     Chief Complaint  Patient presents with  . Bradycardia     (Consider location/radiation/quality/duration/timing/severity/associated sxs/prior Treatment) HPI Comments: Pt states that he has had dizziness and chest pressure that he noticed started yesterday afternoon. Denies sob, n/v, fever, cough. Pt states that he realized that he is supposed to be taking lower dose of his htn medication since June but he didn't change them. State that he is having shoulder and neck pain but he has had that since his fall 5 days ago. Hasn't taken anything for the chest discomfort. Nothing makes the pain better or worse. States that his hr was in the 40's this morning with but his bp was elevated at 180/81. States that he has a cath 5 years ago because he had ekg changes.  The history is provided by the patient. No language interpreter was used.    Past Medical History  Diagnosis Date  . HTN (hypertension)   . Otitis media     with intermittent vertigo  . DJD (degenerative joint disease)   . Glaucoma   . Complication of anesthesia     HARD TO WAKE UP  . Weakness     of neck muscles  . Constipation    Past Surgical History  Procedure Laterality Date  . Eye surgery  06/10/2009 / 07/17/12    glaucoma   . Back surgery  2000  . Carpal tunnell  1970'S  . Tonsillectomy    . Total knee arthroplasty Left 08/05/2012    Procedure: LEFT TOTAL KNEE ARTHROPLASTY;  Surgeon: Loanne Drilling, MD;  Location: [facility];  Service: Orthopedics;  Laterality: Left;   No family history on file. History  Substance Use Topics  . Smoking status: Never Smoker   . Smokeless tobacco: Not on file  . Alcohol Use: No    Review of Systems  Constitutional: Negative.   Respiratory: Negative.   Cardiovascular: Negative.       Allergies  Bextra; Celebrex; Chlorpromazine; Ciprofloxacin; Flomax;  Hyoscyamine; Imuran; Ivp dye; Methotrexate derivatives; and Penicillins  Home Medications   Prior to Admission medications   Medication Sig Start Date End Date Taking? Authorizing Provider  acetaminophen (TYLENOL) 500 MG tablet Take 1,000 mg by mouth every 6 (six) hours as needed.    Historical Provider, MD  atenolol (TENORMIN) 50 MG tablet Take 50 mg by mouth 2 (two) times daily.    Historical Provider, MD  atropine 1 % ophthalmic solution Place 1 drop into the left eye 2 (two) times daily.    Historical Provider, MD  bimatoprost (LUMIGAN) 0.01 % SOLN Place 1 drop into the right eye at bedtime.    Historical Provider, MD  brimonidine (ALPHAGAN) 0.2 % ophthalmic solution Place 1 drop into the left eye 2 (two) times daily.    Historical Provider, MD  Difluprednate (DUREZOL) 0.05 % EMUL Place 1 drop into the right eye 2 (two) times daily.    Historical Provider, MD  dorzolamide-timolol (COSOPT) 22.3-6.8 MG/ML ophthalmic solution Place 1 drop into the right eye 2 (two) times daily.    Historical Provider, MD  doxazosin (CARDURA) 8 MG tablet Take 8 mg by mouth at bedtime.     Historical Provider, MD  finasteride (PROSCAR) 5 MG tablet Take 5 mg by mouth daily.    Historical Provider, MD  losartan-hydrochlorothiazide (HYZAAR) 100-25 MG per tablet Take  1,500 tablets by mouth daily.    Historical Provider, MD  loteprednol (LOTEMAX) 0.5 % ophthalmic suspension Place 1 drop into the left eye 2 (two) times daily.    Historical Provider, MD  methylPREDNIsolone (MEDROL DOSPACK) 4 MG tablet follow package directions 10/04/13   Shon Baton, MD  moxifloxacin (VIGAMOX) 0.5 % ophthalmic solution Place 1 drop into the left eye 2 (two) times daily.    Historical Provider, MD  NON FORMULARY Take 1 tablet by mouth daily. Diamaxx    Muscle creatine    Historical Provider, MD  NON FORMULARY Take 1 capsule by mouth daily. B, A, C   Plus    Historical Provider, MD  NON FORMULARY Take 1 tablet by mouth daily. Eye see  vitamin    Historical Provider, MD  oxyCODONE-acetaminophen (PERCOCET/ROXICET) 5-325 MG per tablet Take 1 tablet by mouth every 6 (six) hours as needed for severe pain. 10/04/13   Shon Baton, MD  prednisoLONE acetate (PRED FORTE) 1 % ophthalmic suspension Place 1 drop into the left eye 2 (two) times daily.    Historical Provider, MD  Saw Palmetto, Serenoa repens, (SAW PALMETTO BERRIES PO) Take 1 capsule by mouth 2 (two) times daily.    Historical Provider, MD   There were no vitals taken for this visit. Physical Exam  Nursing note and vitals reviewed. Constitutional: He is oriented to person, place, and time. He appears well-developed and well-nourished.  HENT:  Head: Normocephalic and atraumatic.  Eyes: Conjunctivae and EOM are normal. Pupils are equal, round, and reactive to light.  Cardiovascular: Normal rate and regular rhythm.   Pulmonary/Chest: Effort normal and breath sounds normal.  Left chest wall tenderness  Musculoskeletal: Normal range of motion.  Right shoulder and right cervical spine tenderness  Neurological: He is alert and oriented to person, place, and time.  Skin: Skin is warm and dry.  Psychiatric: He has a normal mood and affect.    ED Course  Procedures (including critical care time) Labs Review Labs Reviewed  CBC WITH DIFFERENTIAL - Abnormal; Notable for the following:    HCT 37.7 (*)    Platelets 133 (*)    All other components within normal limits  BASIC METABOLIC PANEL - Abnormal; Notable for the following:    Glucose, Bld 107 (*)    GFR calc non Af Amer 63 (*)    GFR calc Af Amer 73 (*)    All other components within normal limits  TROPONIN I    Imaging Review Dg Chest 2 View  10/08/2013   CLINICAL DATA:  Dizziness.  Bradycardia.  EXAM: CHEST  2 VIEW  COMPARISON:  10/04/2013  FINDINGS: Cardiac silhouette is normal in size and configuration. The aorta is mildly uncoiled. No mediastinal or hilar masses. No evidence of adenopathy  Lungs are clear.  Bony thorax is intact. No change from the recent prior study.  IMPRESSION: No acute cardiopulmonary disease.   Electronically Signed   By: Amie Portland M.D.   On: 10/08/2013 13:40   Ct Head Wo Contrast  10/08/2013   CLINICAL DATA:  Status post fall last Friday now with dizziness  EXAM: CT HEAD WITHOUT CONTRAST  TECHNIQUE: Contiguous axial images were obtained from the base of the skull through the vertex without intravenous contrast.  COMPARISON:  Noncontrast CT scan of the brain dated November 04, 2003  FINDINGS: There is mild age appropriate diffuse cerebral and cerebellar atrophy with compensatory ventriculomegaly. There is no intracranial hemorrhage nor intracranial mass effect.  No acute ischemic change is demonstrated. The cerebellum and brainstem exhibit no acute abnormalities.  The observed paranasal sinuses and mastoid air cells exhibit no abnormal fluid collections. There is no acute skull fracture.  IMPRESSION: There are mild age related atrophic changes. There is no acute intracranial hemorrhage nor other acute intracranial abnormality.   Electronically Signed   By: [name]  [place]azil[place]Jordan   On: 10/08/2013 13:38    Date: 10/08/2013  Rate: 53  Rhythm: sinus bradycardia  QRS Axis: normal  Intervals: normal  ST/T Wave abnormalities: normal  Conduction Disutrbances:first-degree A-V block   Narrative Interpretation:   Old EKG Reviewed: unchanged    MDM   Final diagnoses:  Bradycardia    Spoke with Dr. Sharyn LullHarwani and pt is to cut back on his atenolol and go to the lower dose of his losartan-hctz. He will follow up in the office this week. Pt is no longer having ZO:[id number]:review cath from 2010    Teressa LowerVrinda Errik Mitchelle, NP 10/08/13 1545

## 2013-10-08 NOTE — ED Provider Notes (Signed)
Medical screening examination/treatment/procedure(s) were conducted as a shared visit with non-physician practitioner(s) and myself.  I personally evaluated the patient during the encounter.  Taking extra BB. Will decrease dose and ask to follow up with cardiology      Lyanne CoKevin M Seraiah Nowack, MD 10/08/13 [phone]

## 2013-12-17 ENCOUNTER — Other Ambulatory Visit: Payer: Self-pay | Admitting: Gastroenterology

## 2014-03-19 ENCOUNTER — Ambulatory Visit (INDEPENDENT_AMBULATORY_CARE_PROVIDER_SITE_OTHER): Payer: Medicare Other | Admitting: Neurology

## 2014-03-19 ENCOUNTER — Telehealth: Payer: Self-pay | Admitting: Neurology

## 2014-03-19 ENCOUNTER — Encounter: Payer: Self-pay | Admitting: Neurology

## 2014-03-19 VITALS — BP 139/68 | HR 52 | Ht 76.0 in | Wt 229.0 lb

## 2014-03-19 DIAGNOSIS — K5900 Constipation, unspecified: Secondary | ICD-10-CM | POA: Insufficient documentation

## 2014-03-19 DIAGNOSIS — M1990 Unspecified osteoarthritis, unspecified site: Secondary | ICD-10-CM

## 2014-03-19 DIAGNOSIS — R200 Anesthesia of skin: Secondary | ICD-10-CM

## 2014-03-19 NOTE — Telephone Encounter (Signed)
Dave HarmanDana, please get his MRI Cd and EMG/NCS from Dr. Ethelene Halamos

## 2014-03-19 NOTE — Progress Notes (Signed)
PATIENT: [name] DOB: Jun 04, 1935  HISTORICAL  [name] is a 79 year old right-handed African-American male, referred by his primary care physician Dr. Willey Blade, and orthopedic pain management Dr. Ethelene Hal  for evaluation of left hand numbness, weakness,  He had long-standing history of gradual onset progressive worsening neck flexion weakness, was previously evaluated by Dr. love, and also second opinion by neurologist at [facility], was considered due to cervical degenerative disc disease, over the years, his symptoms was controlled by physical therapy, chiropractor, he has difficulty extending his neck, but denies significant neck pain, or gait difficulty  He fell in August 2015, landed at bilateral shoulder, Since then, he had radiating pain to his bilateral arm, clumsiness of bilateral hands, also persistent left thumb numbness, left hand weakness.  He was evaluated by Dr. Ethelene Hal, cervical spine in November 13 2013 at [facility] orthopedic showed diffuse develop an mental narrowing of the central canal of the cervical spine, with superimposed degenerative changes most prominent at C5-6, and C4-5, at C5-6, mild central canal stenosis with mild ventral cord flattening, severe bilateral neural foraminal narrowing with encroachment on the C6 dorsal root ganglion, at C4-5, mild central canal stenosis, with mild ventral cord flattening, moderate to severe left neural foraminal narrowing with probable encroachment on the left C5 dorsal root ganglion.  He also reported abnormal electrodiagnostic study by Dr. Ethelene Hal, planning on have left wrist surgery, sounds like left carpal tunnel release surgery,  REVIEW OF SYSTEMS: Full 14 system review of systems performed and notable only for numbness, weakness of left hand ALLERGIES: Allergies  Allergen Reactions  . Celebrex [Celecoxib] Other (See Comments)    "ran my b/p up high, almost had a stroke blood pressure  . Ivp Dye [Iodinated  Diagnostic Agents] Other (See Comments)    "started burning like fire" burning  . Bextra [Valdecoxib] Other (See Comments)    Pt passed out Increase blood pressure  . Chlorpromazine     Pt states he thinks he becomes dizzy at times, but hard to recall at this time  . Ciprofloxacin     Unknown   . Flomax [Tamsulosin Hcl] Other (See Comments)    Blurred vision  . Hyoscyamine Other (See Comments)    "I pass out"  . Imuran [Azathioprine] Other (See Comments)    Patient unable to provide any additional info.  Not sure of this drug at all even after reviewing possible uses. Passed out  . Methotrexate Other (See Comments)    Caused anemia  . Methotrexate Derivatives     Killed red blood cells, zombie like, killed immune system   . Nasal Spray Other (See Comments)    "Blurred vision"  . Penicillins Other (See Comments)    Problems with urination and GI problems    HOME MEDICATIONS: Current Outpatient Prescriptions on File Prior to Visit  Medication Sig Dispense Refill  . acetaminophen (TYLENOL) 500 MG tablet Take 1,000 mg by mouth every 6 (six) hours as needed.    Marland Kitchen atenolol (TENORMIN) 50 MG tablet Take 50 mg by mouth 2 (two) times daily.    Marland Kitchen atropine 1 % ophthalmic solution Place 1 drop into the left eye 2 (two) times daily.    . bimatoprost (LUMIGAN) 0.01 % SOLN Place 1 drop into the right eye at bedtime.    . brimonidine (ALPHAGAN) 0.2 % ophthalmic solution Place 1 drop into the left eye 2 (two) times daily.    . Difluprednate (DUREZOL) 0.05 % EMUL Place  1 drop into the right eye 2 (two) times daily.    . dorzolamide-timolol (COSOPT) 22.3-6.8 MG/ML ophthalmic solution Place 1 drop into the right eye 2 (two) times daily.    Marland Kitchen doxazosin (CARDURA) 8 MG tablet Take 8 mg by mouth at bedtime.     . finasteride (PROSCAR) 5 MG tablet Take 5 mg by mouth daily.    Marland Kitchen losartan-hydrochlorothiazide (HYZAAR) 100-25 MG per tablet Take 1,500 tablets by mouth daily.    Marland Kitchen loteprednol (LOTEMAX) 0.5  % ophthalmic suspension Place 1 drop into the left eye 2 (two) times daily.    . methylPREDNIsolone (MEDROL DOSPACK) 4 MG tablet follow package directions 21 tablet 0  . moxifloxacin (VIGAMOX) 0.5 % ophthalmic solution Place 1 drop into the left eye 2 (two) times daily.    . NON FORMULARY Take 1 capsule by mouth daily. B, A, C   Plus    . NON FORMULARY Take 1 tablet by mouth daily. Eye see vitamin    . oxyCODONE-acetaminophen (PERCOCET/ROXICET) 5-325 MG per tablet Take 1 tablet by mouth every 6 (six) hours as needed for severe pain. 15 tablet 0  . prednisoLONE acetate (PRED FORTE) 1 % ophthalmic suspension Place 1 drop into the left eye 2 (two) times daily.    . Saw Palmetto, Serenoa repens, (SAW PALMETTO BERRIES PO) Take 1 capsule by mouth 2 (two) times daily.    . [DISCONTINUED] lisinopril-hydrochlorothiazide (PRINZIDE,ZESTORETIC) 20-25 MG per tablet Take 1 tablet by mouth every morning.      No current facility-administered medications on file prior to visit.    PAST MEDICAL HISTORY: Past Medical History  Diagnosis Date  . HTN (hypertension)   . Otitis media     with intermittent vertigo  . DJD (degenerative joint disease)   . Glaucoma   . Complication of anesthesia     HARD TO WAKE UP  . Weakness     of neck muscles  . Constipation   . Arthritis     PAST SURGICAL HISTORY: Past Surgical History  Procedure Laterality Date  . Eye surgery  06/10/2009 / 07/17/12    glaucoma   . Back surgery  2000  . Carpal tunnell  1970'S  . Tonsillectomy    . Total knee arthroplasty Left 08/05/2012    Procedure: LEFT TOTAL KNEE ARTHROPLASTY;  Surgeon: Loanne Drilling, MD;  Location: [facility];  Service: Orthopedics;  Laterality: Left;  . Back surgery      FAMILY HISTORY: History reviewed. No pertinent family history.  SOCIAL HISTORY:  History   Social History  . Marital Status: Widowed    Spouse Name: N/A    Number of Children: 3  . Years of Education: 12 th   Occupational History     retired from [employer],    Social History Main Topics  . Smoking status: Never Smoker   . Smokeless tobacco: Never Used  . Alcohol Use: No  . Drug Use: No  . Sexual Activity: Not on file   Other Topics Concern  . Not on file   Social History Narrative   Patient lives at home alone and he is widowed.   Retired.   Education high school.   Right handed.   Caffeine Very rare soda and one coffee.     PHYSICAL EXAM   Filed Vitals:   03/19/14 1341  BP: 139/68  Pulse: 52  Height:  (1.93 m)  Weight: 229 lb (103.874 kg)    Not recorded  Body mass index is 27.89 kg/(m^2).   Generalized: In no acute distress  Neck: Supple, no carotid bruits   Cardiac: Regular rate rhythm  Pulmonary: Clear to auscultation bilaterally  Musculoskeletal: No deformity  Neurological examination  Mentation: Alert oriented to time, place, history taking, and causual conversation  Cranial nerve II-XII: Pupils were equal round reactive to light. Extraocular movements were full.  Visual field were full on confrontational test. Bilateral fundi were sharp.  Facial sensation and strength were normal. Hearing was intact to finger rubbing bilaterally. Uvula tongue midline.  Head turning and shoulder shrug and were normal and symmetric.Tongue protrusion into cheek strength was normal.  Motor: Normal tone, bulk and strength, he has mild left abductor pollicis brevis, opponens weakness  Sensory: Intact to fine touch, pinprick, preserved vibratory sensation, and proprioception at toes, with exception of decreased light touch pinprick at left thumb  Coordination: Normal finger to nose, heel-to-shin bilaterally there was no truncal ataxia  Gait: Need to push up from seated position, cautious, mildly unsteady, valgrus knee, Romberg signs: Negative  Deep tendon reflexes: Brachioradialis 2/2, biceps 2/2, triceps 2/2, patellar 2/2, Achilles trace , plantar responses were flexor  bilaterally.   DIAGNOSTIC DATA (LABS, IMAGING, TESTING) - I reviewed patient records, labs, notes, testing and imaging myself where available.  Lab Results  Component Value Date   WBC 4.2 10/08/2013   HGB 13.3 10/08/2013   HCT 37.7* 10/08/2013   MCV 88.7 10/08/2013   PLT 133* 10/08/2013      Component Value Date/Time   NA 141 10/08/2013 1345   K 3.8 10/08/2013 1345   CL 101 10/08/2013 1345   CO2 28 10/08/2013 1345   GLUCOSE 107* 10/08/2013 1345   BUN 13 10/08/2013 1345   CREATININE 1.10 10/08/2013 1345   CALCIUM 10.1 10/08/2013 1345   PROT 7.3 07/29/2012 1525   ALBUMIN 3.6 07/29/2012 1525   AST 24 07/29/2012 1525   ALT 15 07/29/2012 1525   ALKPHOS 74 07/29/2012 1525   BILITOT 0.5 07/29/2012 1525   GFRNONAA 63* 10/08/2013 1345   GFRAA 73* 10/08/2013 1345    ASSESSMENT AND PLAN  Doyle AskewBobby D Dobos is a 79 y.o. male with past medical history of fixed neck flexion, due to cervical degenerative disc disease, now presenting with radiating pain to bilateral upper extremity, left thumb numbness since his fall few month ago, most consistent with left cervical radiculopathy, likely superimposed left carpal tunnel syndrome,  He is planning on to have left carpal tunnel release surgery in February MRI cervical cd to review in his next follow-up in 2 month, Record for electrodiagnostic study    No orders of the defined types were placed in this encounter.    No Follow-up on file.  Levert FeinsteinYijun Vinny Taranto, M.D. Ph.D.  [facility] [address], [address] [place], [place] [id number] [phone]

## 2014-03-23 ENCOUNTER — Telehealth: Payer: Self-pay | Admitting: *Deleted

## 2014-03-23 NOTE — Telephone Encounter (Signed)
Request fax over to Dr. Ethelene Halamos on 03/23/14.

## 2014-03-23 NOTE — Telephone Encounter (Signed)
Medical records dept will mail patient CD.

## 2014-05-20 ENCOUNTER — Encounter: Payer: Self-pay | Admitting: Neurology

## 2014-05-20 ENCOUNTER — Ambulatory Visit (INDEPENDENT_AMBULATORY_CARE_PROVIDER_SITE_OTHER): Payer: Medicare Other | Admitting: Neurology

## 2014-05-20 VITALS — BP 163/73 | HR 60 | Ht 76.0 in | Wt 229.0 lb

## 2014-05-20 DIAGNOSIS — M542 Cervicalgia: Secondary | ICD-10-CM | POA: Insufficient documentation

## 2014-05-20 DIAGNOSIS — R200 Anesthesia of skin: Secondary | ICD-10-CM | POA: Insufficient documentation

## 2014-05-20 DIAGNOSIS — M1990 Unspecified osteoarthritis, unspecified site: Secondary | ICD-10-CM

## 2014-05-20 NOTE — Progress Notes (Signed)
PATIENT: [name] DOB: 12/21/1935  HISTORICAL  [name] is a 79 year old right-handed African-American male, referred by his primary care physician Dr. Willey Blade, and orthopedic pain management Dr. Ethelene Hal  for evaluation of left hand numbness, weakness,  He had long-standing history of gradual onset progressive worsening neck flexion, was previously evaluated by Dr. Sandria Manly, and also second opinion by neurologist Dr. Blain Pais at [facility], was considered due to cervical degenerative disc disease, over the years, his symptoms was controlled by physical therapy, chiropractor, he has difficulty extending his neck, but denies significant neck pain, or gait difficulty.    He fell in August 2015, landed at his shoulder, since then, he had radiating pain to his bilateral arm, clumsiness of bilateral hands, also persistent left thumb numbness, left hand weakness.  He was evaluated by Dr. Ethelene Hal, cervical spine in November 12 2013 at [facility] orthopedic showed diffuse develop an mental narrowing of the central canal of the cervical spine, with superimposed degenerative changes most prominent at C5-6, and C4-5, at C5-6, mild central canal stenosis with mild ventral cord flattening, severe bilateral neural foraminal narrowing with encroachment on the C6 dorsal root ganglion, at C4-5, mild central canal stenosis, with mild ventral cord flattening, moderate to severe left neural foraminal narrowing with probable encroachment on the left C5 dorsal root ganglion.  He also reported abnormal electrodiagnostic study by Dr. Ethelene Hal, planning on have left wrist surgery, sounds like left carpal tunnel release surgery.  UPDATE May 20 2014: I have reviewed the disc he brought in, MRI cervical in November 12 2013, at [facility] orthopedic showed diffuse develop an mental narrowing of the central canal of the cervical spine, with superimposed degenerative changes most prominent at C5-6, and C4-5, at  C5-6, mild central canal stenosis with mild ventral cord flattening, severe bilateral neural foraminal narrowing with encroachment on the C6 dorsal root ganglion, at C4-5, mild central canal stenosis, with mild ventral cord flattening, moderate to severe left neural foraminal narrowing with probable encroachment on the left C5 dorsal root ganglion.  MRI of right shoulder, mild diffuse subscapularis tendinosis, mild to moderate degeneration of the glenoid labrum, advanced chronic arthritis, and the mild synovitis of the acromioclavicular joints, mildly deforms the supraspinatus myotendinous junction, mild diffuse edema of the fat of the rotator interval which could be associated with adhesive capsulitis.  He had Left Carpal Tunnel release surgery in Feb 3rd 2016, which has helped his left hand, he has less left hand paresthesia, less weakness.  He has significant gait difficulty, urinary urgency, he has no neck pain, difficulty to keep his head up.  REVIEW OF SYSTEMS: Full 14 system review of systems performed and notable only for numbness, weakness of left hand ALLERGIES: Allergies  Allergen Reactions  . Celebrex [Celecoxib] Other (See Comments)    "ran my b/p up high, almost had a stroke blood pressure  . Ivp Dye [Iodinated Diagnostic Agents] Other (See Comments)    "started burning like fire" burning  . Bextra [Valdecoxib] Other (See Comments)    Pt passed out Increase blood pressure  . Chlorpromazine     Pt states he thinks he becomes dizzy at times, but hard to recall at this time  . Ciprofloxacin     Unknown   . Flomax [Tamsulosin Hcl] Other (See Comments)    Blurred vision  . Hyoscyamine Other (See Comments)    "I pass out"  . Imuran [Azathioprine] Other (See Comments)    Patient unable to provide  any additional info.  Not sure of this drug at all even after reviewing possible uses. Passed out  . Methotrexate Other (See Comments)    Caused anemia  . Methotrexate Derivatives       Killed red blood cells, zombie like, killed immune system   . Nasal Spray Other (See Comments)    "Blurred vision"  . Penicillins Other (See Comments)    Problems with urination and GI problems    HOME MEDICATIONS: Current Outpatient Prescriptions on File Prior to Visit  Medication Sig Dispense Refill  . acetaminophen (TYLENOL) 500 MG tablet Take 1,000 mg by mouth every 6 (six) hours as needed.    Marland Kitchen. atenolol (TENORMIN) 50 MG tablet Take 50 mg by mouth 2 (two) times daily.    Marland Kitchen. atropine 1 % ophthalmic solution Place 1 drop into the left eye 2 (two) times daily.    . bimatoprost (LUMIGAN) 0.01 % SOLN Place 1 drop into the right eye at bedtime.    . brimonidine (ALPHAGAN) 0.2 % ophthalmic solution Place 1 drop into the left eye 2 (two) times daily.    . Difluprednate (DUREZOL) 0.05 % EMUL Place 1 drop into the right eye 2 (two) times daily.    . dorzolamide-timolol (COSOPT) 22.3-6.8 MG/ML ophthalmic solution Place 1 drop into the right eye 2 (two) times daily.    Marland Kitchen. doxazosin (CARDURA) 8 MG tablet Take 8 mg by mouth at bedtime.     . finasteride (PROSCAR) 5 MG tablet Take 5 mg by mouth daily.    Marland Kitchen. losartan-hydrochlorothiazide (HYZAAR) 100-25 MG per tablet Take 1,500 tablets by mouth daily.    Marland Kitchen. loteprednol (LOTEMAX) 0.5 % ophthalmic suspension Place 1 drop into the left eye 2 (two) times daily.    . methylPREDNIsolone (MEDROL DOSPACK) 4 MG tablet follow package directions 21 tablet 0  . moxifloxacin (VIGAMOX) 0.5 % ophthalmic solution Place 1 drop into the left eye 2 (two) times daily.    . Multiple Vitamin (MULTIVITAMIN) capsule Take 1 capsule by mouth daily.    . NON FORMULARY Take 1 capsule by mouth daily. B, A, C   Plus    . NON FORMULARY Take 1 tablet by mouth daily. Eye see vitamin    . prednisoLONE acetate (PRED FORTE) 1 % ophthalmic suspension Place 1 drop into the left eye 2 (two) times daily.    . Saw Palmetto, Serenoa repens, (SAW PALMETTO BERRIES PO) Take 1 capsule by mouth 2  (two) times daily.    . [DISCONTINUED] lisinopril-hydrochlorothiazide (PRINZIDE,ZESTORETIC) 20-25 MG per tablet Take 1 tablet by mouth every morning.      No current facility-administered medications on file prior to visit.    PAST MEDICAL HISTORY: Past Medical History  Diagnosis Date  . HTN (hypertension)   . Otitis media     with intermittent vertigo  . DJD (degenerative joint disease)   . Glaucoma   . Complication of anesthesia     HARD TO WAKE UP  . Weakness     of neck muscles  . Constipation   . Arthritis     PAST SURGICAL HISTORY: Past Surgical History  Procedure Laterality Date  . Eye surgery  06/10/2009 / 07/17/12    glaucoma   . Back surgery  2000  . Carpal tunnell  1970'S  . Tonsillectomy    . Total knee arthroplasty Left 08/05/2012    Procedure: LEFT TOTAL KNEE ARTHROPLASTY;  Surgeon: Loanne DrillingFrank V Aluisio, MD;  Location: [facility];  Service: Orthopedics;  Laterality: Left;  . Back surgery    . Carpal tunnel Left     03/2014    FAMILY HISTORY: No family history on file.  SOCIAL HISTORY:  History   Social History  . Marital Status: Widowed    Spouse Name: N/A    Number of Children: 3  . Years of Education: 12 th   Occupational History    retired from [employer],    Social History Main Topics  . Smoking status: Never Smoker   . Smokeless tobacco: Never Used  . Alcohol Use: No  . Drug Use: No  . Sexual Activity: Not on file   Other Topics Concern  . Not on file   Social History Narrative   Patient lives at home alone and he is widowed.   Retired.   Education high school.   Right handed.   Caffeine Very rare soda and one coffee.     PHYSICAL EXAM   Filed Vitals:   05/20/14 1116  BP: 163/73  Pulse: 60  Height:  (1.93 m)  Weight: 229 lb (103.874 kg)    Not recorded      Body mass index is 27.89 kg/(m^2).  PHYSICAL EXAMNIATION:  Gen: NAD, conversant, well nourised, obese, well groomed                     Cardiovascular: Regular rate  rhythm, no peripheral edema, warm, nontender. Eyes: Conjunctivae clear without exudates or hemorrhage Neck: Supple, no carotid bruise. Pulmonary: Clear to auscultation bilaterally  Musculoskeletal: Have a tendency for neck flexion, limited range of motion, maximum neck extension was 95, no significant neck flexion, or neck extension weakness  NEUROLOGICAL EXAM:  MENTAL STATUS: Speech:    Speech is normal; fluent and spontaneous with normal comprehension.  Cognition:    The patient is oriented to person, place, and time;     recent and remote memory intact;     language fluent;     normal attention, concentration,     fund of knowledge.  CRANIAL NERVES: CN II: Visual fields are full to confrontation. Fundoscopic exam is normal with sharp discs and no vascular changes. Venous pulsations are present bilaterally. Pupils are 4 mm and briskly reactive to light. Visual acuity is 20/20 bilaterally. CN III, IV, VI: extraocular movement are normal. No ptosis. CN V: Facial sensation is intact to pinprick in all 3 divisions bilaterally. Corneal responses are intact.  CN VII: Face is symmetric with normal eye closure and smile. CN VIII: Hearing is normal to rubbing fingers CN IX, X: Palate elevates symmetrically. Phonation is normal. CN XI: Head turning and shoulder shrug are intact CN XII: Tongue is midline with normal movements and no atrophy.  MOTOR: There is no pronator drift of out-stretched arms. Muscle bulk and tone are normal. Muscle strength is normal. Limited range of motion of neck muscles   Shoulder abduction Shoulder external rotation Elbow flexion Elbow extension Wrist flexion Wrist extension Finger abduction Hip flexion Knee flexion Knee extension Ankle dorsi flexion Ankle plantar flexion  R L REFLEXES: Reflexes are 2+ and symmetric at the biceps, triceps, knees, and ankles. Plantar responses are flexor.  SENSORY: Light  touch, pinprick, position sense, and vibration sense are intact in fingers and toes.  COORDINATION: Rapid alternating movements and fine finger movements are intact. There  is no dysmetria on finger-to-nose and heel-knee-shin. There are no abnormal or extraneous movements.   GAIT/STANCE: Posture is cautious Romberg is absent.     DIAGNOSTIC DATA (LABS, IMAGING, TESTING) - I reviewed patient records, labs, notes, testing and imaging myself where available.  Lab Results  Component Value Date   WBC 4.2 10/08/2013   HGB 13.3 10/08/2013   HCT 37.7* 10/08/2013   MCV 88.7 10/08/2013   PLT 133* 10/08/2013      Component Value Date/Time   NA 141 10/08/2013 1345   K 3.8 10/08/2013 1345   CL 101 10/08/2013 1345   CO2 28 10/08/2013 1345   GLUCOSE 107* 10/08/2013 1345   BUN 13 10/08/2013 1345   CREATININE 1.10 10/08/2013 1345   CALCIUM 10.1 10/08/2013 1345   PROT 7.3 07/29/2012 1525   ALBUMIN 3.6 07/29/2012 1525   AST 24 07/29/2012 1525   ALT 15 07/29/2012 1525   ALKPHOS 74 07/29/2012 1525   BILITOT 0.5 07/29/2012 1525   GFRNONAA 63* 10/08/2013 1345   GFRAA 73* 10/08/2013 1345    ASSESSMENT AND PLAN  [name] is a 79 y.o. male with past medical history of fixed neck flexion, due to cervical degenerative disc disease, now presenting with radiating pain to bilateral upper extremity, left thumb numbness since his fall few month ago, most consistent with left cervical radiculopathy, likely superimposed left carpal tunnel syndrome, which had a mild to moderate improvement after carpal tunnel release surgery  He continue have significant limited range of motion of his neck, mild neck pain, we have reviewed MRI cervical spine, chronic degenerative disc disease, no evidence of canal stenosis Refer him for physical therapy thermotherapy Acetylcholine receptor antibody to rule out myasthenia gravis Return to clinic in 6 months   Levert Feinstein, M.D. Ph.D.  [facility] [address] [place], [place] [address] [phone]

## 2014-05-22 ENCOUNTER — Telehealth: Payer: Self-pay | Admitting: *Deleted

## 2014-05-22 NOTE — Telephone Encounter (Signed)
LMTC./fim 

## 2014-05-22 NOTE — Telephone Encounter (Signed)
-----   Message from Levert FeinsteinYijun Yan, MD sent at 05/22/2014 11:52 AM EDT ----- Please call patient lab showed mild elevated CPK of unknown clinical signficiance

## 2014-05-27 LAB — ACETYLCHOLINE RECEPTOR, MODULATING: Acetylcholine Modulat Ab: <12 % (ref 0–20)

## 2014-05-27 LAB — CK: Total CK: 229 U/L — ABNORMAL HIGH (ref 24–204)

## 2014-05-27 LAB — ACETYLCHOLINE RECEPTOR, BINDING: AChR Binding Ab, Serum: 0.08 nmol/L (ref 0.00–0.24)

## 2014-07-07 ENCOUNTER — Ambulatory Visit: Payer: Medicare Other | Attending: Neurology

## 2014-07-07 DIAGNOSIS — M436 Torticollis: Secondary | ICD-10-CM

## 2014-07-07 DIAGNOSIS — M542 Cervicalgia: Secondary | ICD-10-CM

## 2014-07-07 DIAGNOSIS — G8929 Other chronic pain: Secondary | ICD-10-CM | POA: Insufficient documentation

## 2014-07-07 DIAGNOSIS — M2560 Stiffness of unspecified joint, not elsewhere classified: Secondary | ICD-10-CM | POA: Diagnosis not present

## 2014-07-07 DIAGNOSIS — R293 Abnormal posture: Secondary | ICD-10-CM | POA: Diagnosis not present
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
## 2014-07-15 ENCOUNTER — Ambulatory Visit: Payer: Medicare Other | Admitting: Physical Therapy

## 2014-07-15 DIAGNOSIS — M2560 Stiffness of unspecified joint, not elsewhere classified: Secondary | ICD-10-CM

## 2014-07-15 DIAGNOSIS — M436 Torticollis: Secondary | ICD-10-CM | POA: Diagnosis not present

## 2014-07-15 DIAGNOSIS — G8929 Other chronic pain: Secondary | ICD-10-CM

## 2014-07-15 DIAGNOSIS — M542 Cervicalgia: Secondary | ICD-10-CM

## 2014-07-15 DIAGNOSIS — R293 Abnormal posture: Secondary | ICD-10-CM
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
## 2014-07-21 ENCOUNTER — Ambulatory Visit: Payer: Medicare Other | Admitting: Physical Therapy

## 2014-07-21 DIAGNOSIS — R293 Abnormal posture: Secondary | ICD-10-CM

## 2014-07-21 DIAGNOSIS — M2560 Stiffness of unspecified joint, not elsewhere classified: Secondary | ICD-10-CM

## 2014-07-21 DIAGNOSIS — M436 Torticollis: Secondary | ICD-10-CM | POA: Diagnosis not present

## 2014-07-21 DIAGNOSIS — M542 Cervicalgia: Secondary | ICD-10-CM

## 2014-07-21 DIAGNOSIS — G8929 Other chronic pain: Secondary | ICD-10-CM
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
## 2014-07-24 ENCOUNTER — Ambulatory Visit: Payer: Medicare Other

## 2014-07-24 DIAGNOSIS — M542 Cervicalgia: Secondary | ICD-10-CM

## 2014-07-24 DIAGNOSIS — M436 Torticollis: Secondary | ICD-10-CM

## 2014-07-24 DIAGNOSIS — R293 Abnormal posture: Secondary | ICD-10-CM

## 2014-07-24 DIAGNOSIS — G8929 Other chronic pain: Secondary | ICD-10-CM

## 2014-07-24 DIAGNOSIS — M2560 Stiffness of unspecified joint, not elsewhere classified: Secondary | ICD-10-CM
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
## 2014-07-28 ENCOUNTER — Ambulatory Visit: Payer: Medicare Other

## 2014-07-28 DIAGNOSIS — M436 Torticollis: Secondary | ICD-10-CM | POA: Diagnosis not present

## 2014-07-28 DIAGNOSIS — M2560 Stiffness of unspecified joint, not elsewhere classified: Secondary | ICD-10-CM

## 2014-07-28 DIAGNOSIS — M542 Cervicalgia: Secondary | ICD-10-CM

## 2014-07-28 DIAGNOSIS — G8929 Other chronic pain: Secondary | ICD-10-CM

## 2014-07-28 DIAGNOSIS — R293 Abnormal posture: Secondary | ICD-10-CM
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
## 2014-07-28 NOTE — Patient Instructions (Signed)
We spent 10 min on education with results of xray and information given by DR Shon BatonBrooks to patient on options. He had questions about cause of problem and I answered as best I could but was not sure why.

## 2014-08-03 ENCOUNTER — Ambulatory Visit: Payer: Medicare Other | Attending: Neurology

## 2014-08-03 DIAGNOSIS — M2560 Stiffness of unspecified joint, not elsewhere classified: Secondary | ICD-10-CM | POA: Diagnosis present

## 2014-08-03 DIAGNOSIS — G8929 Other chronic pain: Secondary | ICD-10-CM | POA: Insufficient documentation

## 2014-08-03 DIAGNOSIS — M436 Torticollis: Secondary | ICD-10-CM | POA: Insufficient documentation

## 2014-08-03 DIAGNOSIS — M542 Cervicalgia: Secondary | ICD-10-CM

## 2014-08-03 DIAGNOSIS — R293 Abnormal posture: Secondary | ICD-10-CM
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
## 2014-08-05 ENCOUNTER — Ambulatory Visit: Payer: Medicare Other

## 2014-08-05 DIAGNOSIS — M2560 Stiffness of unspecified joint, not elsewhere classified: Secondary | ICD-10-CM

## 2014-08-05 DIAGNOSIS — M436 Torticollis: Secondary | ICD-10-CM | POA: Diagnosis not present

## 2014-08-05 DIAGNOSIS — R293 Abnormal posture: Secondary | ICD-10-CM

## 2014-08-05 DIAGNOSIS — G8929 Other chronic pain: Secondary | ICD-10-CM

## 2014-08-05 DIAGNOSIS — M542 Cervicalgia: Secondary | ICD-10-CM
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
## 2014-08-11 ENCOUNTER — Ambulatory Visit: Payer: Medicare Other | Admitting: Physical Therapy

## 2014-08-11 DIAGNOSIS — M2560 Stiffness of unspecified joint, not elsewhere classified: Secondary | ICD-10-CM

## 2014-08-11 DIAGNOSIS — M436 Torticollis: Secondary | ICD-10-CM

## 2014-08-11 DIAGNOSIS — G8929 Other chronic pain: Secondary | ICD-10-CM

## 2014-08-11 DIAGNOSIS — R293 Abnormal posture: Secondary | ICD-10-CM

## 2014-08-11 DIAGNOSIS — M542 Cervicalgia: Secondary | ICD-10-CM
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
## 2014-08-18 ENCOUNTER — Telehealth: Payer: Self-pay | Admitting: Neurology

## 2014-08-18 NOTE — Telephone Encounter (Signed)
Patient called and requested a prescription be written by the doctor for a lift chair in his home. Please call and advise. It needs to be sent to [facility] [phone]).

## 2014-08-18 NOTE — Telephone Encounter (Addendum)
Would you like to provide this prescription?  Rx for lift chair is ready. Levert Feinstein, M.D. Ph.D.  [facility] [address] [place], [place] [address] Phone: [phone] Fax:      [phone]

## 2014-08-19 NOTE — Telephone Encounter (Signed)
Rx faxed and confirmed to [facility] at [phone].

## 2014-08-25 ENCOUNTER — Ambulatory Visit: Payer: Medicare Other

## 2014-08-25 DIAGNOSIS — M436 Torticollis: Secondary | ICD-10-CM

## 2014-08-25 DIAGNOSIS — M542 Cervicalgia: Secondary | ICD-10-CM

## 2014-08-25 DIAGNOSIS — R293 Abnormal posture: Secondary | ICD-10-CM

## 2014-08-25 DIAGNOSIS — G8929 Other chronic pain: Secondary | ICD-10-CM

## 2014-08-25 DIAGNOSIS — M2560 Stiffness of unspecified joint, not elsewhere classified: Secondary | ICD-10-CM
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
## 2014-08-28 ENCOUNTER — Ambulatory Visit: Payer: Medicare Other | Attending: Neurology

## 2014-08-28 DIAGNOSIS — G8929 Other chronic pain: Secondary | ICD-10-CM | POA: Diagnosis present

## 2014-08-28 DIAGNOSIS — M2560 Stiffness of unspecified joint, not elsewhere classified: Secondary | ICD-10-CM | POA: Diagnosis present

## 2014-08-28 DIAGNOSIS — M542 Cervicalgia: Secondary | ICD-10-CM | POA: Insufficient documentation

## 2014-08-28 DIAGNOSIS — M436 Torticollis: Secondary | ICD-10-CM

## 2014-08-28 DIAGNOSIS — R293 Abnormal posture: Secondary | ICD-10-CM | POA: Insufficient documentation

## 2014-08-28 NOTE — Patient Instructions (Signed)
From cabinet standing wall stretches 10-20 reps single arm and both arms
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
## 2014-09-09 ENCOUNTER — Ambulatory Visit: Payer: Medicare Other | Admitting: Physical Therapy

## 2014-09-09 DIAGNOSIS — R293 Abnormal posture: Secondary | ICD-10-CM

## 2014-09-09 DIAGNOSIS — M542 Cervicalgia: Secondary | ICD-10-CM

## 2014-09-09 DIAGNOSIS — M436 Torticollis: Secondary | ICD-10-CM

## 2014-09-09 DIAGNOSIS — M2560 Stiffness of unspecified joint, not elsewhere classified: Secondary | ICD-10-CM

## 2014-09-09 DIAGNOSIS — G8929 Other chronic pain: Secondary | ICD-10-CM
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
## 2014-09-11 ENCOUNTER — Ambulatory Visit: Payer: Medicare Other

## 2014-09-11 DIAGNOSIS — M542 Cervicalgia: Secondary | ICD-10-CM

## 2014-09-11 DIAGNOSIS — M436 Torticollis: Secondary | ICD-10-CM

## 2014-09-11 DIAGNOSIS — R293 Abnormal posture: Secondary | ICD-10-CM

## 2014-09-11 DIAGNOSIS — M2560 Stiffness of unspecified joint, not elsewhere classified: Secondary | ICD-10-CM

## 2014-09-11 DIAGNOSIS — G8929 Other chronic pain: Secondary | ICD-10-CM
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
## 2014-09-15 ENCOUNTER — Ambulatory Visit: Payer: Medicare Other

## 2014-09-15 DIAGNOSIS — M2560 Stiffness of unspecified joint, not elsewhere classified: Secondary | ICD-10-CM

## 2014-09-15 DIAGNOSIS — M436 Torticollis: Secondary | ICD-10-CM | POA: Diagnosis not present

## 2014-09-15 DIAGNOSIS — G8929 Other chronic pain: Secondary | ICD-10-CM

## 2014-09-15 DIAGNOSIS — M542 Cervicalgia: Secondary | ICD-10-CM

## 2014-09-15 DIAGNOSIS — R293 Abnormal posture: Secondary | ICD-10-CM
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
## 2014-09-15 NOTE — Patient Instructions (Signed)
Standing wall exercises with various movements to improve posture and increase scapula and upper thoracic mobility

## 2014-09-18 ENCOUNTER — Ambulatory Visit: Payer: Medicare Other | Admitting: Physical Therapy

## 2014-09-23 ENCOUNTER — Ambulatory Visit: Payer: Medicare Other | Admitting: Physical Therapy

## 2014-09-23 DIAGNOSIS — M436 Torticollis: Secondary | ICD-10-CM

## 2014-09-23 DIAGNOSIS — M542 Cervicalgia: Secondary | ICD-10-CM

## 2014-09-23 DIAGNOSIS — M2560 Stiffness of unspecified joint, not elsewhere classified: Secondary | ICD-10-CM

## 2014-09-23 DIAGNOSIS — G8929 Other chronic pain: Secondary | ICD-10-CM

## 2014-09-23 DIAGNOSIS — R293 Abnormal posture: Secondary | ICD-10-CM
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
## 2014-09-25 ENCOUNTER — Ambulatory Visit: Payer: Medicare Other | Admitting: Physical Therapy

## 2014-11-24 ENCOUNTER — Ambulatory Visit (INDEPENDENT_AMBULATORY_CARE_PROVIDER_SITE_OTHER): Payer: Medicare Other | Admitting: Neurology

## 2014-11-24 ENCOUNTER — Encounter: Payer: Self-pay | Admitting: Neurology

## 2014-11-24 VITALS — BP 154/79 | HR 64 | Ht 76.0 in | Wt 226.0 lb

## 2014-11-24 DIAGNOSIS — M1990 Unspecified osteoarthritis, unspecified site: Secondary | ICD-10-CM | POA: Diagnosis not present

## 2014-11-24 DIAGNOSIS — M542 Cervicalgia: Secondary | ICD-10-CM | POA: Diagnosis not present

## 2014-11-24 DIAGNOSIS — R200 Anesthesia of skin: Secondary | ICD-10-CM

## 2014-11-24 NOTE — Progress Notes (Signed)
Chief Complaint  Patient presents with  . Arthritis    He has completed physical therapy and he only noticed mild improvement.  He is still having a hard time holding his head up due to his muscle weakness.  He is not having pain.      PATIENT: [name] DOB: 1935/09/24  HISTORICAL  [name] is a 79 year old right-handed African-American male, referred by his primary care physician Dr. Willey Blade, and orthopedic pain management Dr. Ethelene Hal  for evaluation of left hand numbness, weakness,  He had long-standing history of gradual onset progressive worsening neck flexion, was previously evaluated by Dr. Sandria Manly, and also second opinion by neurologist Dr. Blain Pais at [facility], was considered due to cervical degenerative disc disease, over the years, his symptoms was controlled by physical therapy, chiropractor, he has difficulty extending his neck, but denies significant neck pain, or gait difficulty.    He fell in August 2015, landed at his shoulder, since then, he had radiating pain to his bilateral arm, clumsiness of bilateral hands, also persistent left thumb numbness, left hand weakness.  He was evaluated by Dr. Ethelene Hal, cervical spine in November 12 2013 at [facility] orthopedic showed diffuse moderate narrowing of the central canal of the cervical spine, with superimposed degenerative changes most prominent at C5-6, and C4-5, at C5-6, mild central canal stenosis with mild ventral cord flattening, severe bilateral neural foraminal narrowing with encroachment on the C6 dorsal root ganglion, at C4-5, mild central canal stenosis, with mild ventral cord flattening, moderate to severe left neural foraminal narrowing with probable encroachment on the left C5 dorsal root ganglion.  He also reported abnormal electrodiagnostic study by Dr. Ethelene Hal, planning on have left wrist surgery, sounds like left carpal tunnel release surgery.  UPDATE May 20 2014: I have reviewed the disc he  brought in, MRI cervical in November 12 2013, at [facility] orthopedic showed diffuse developmental narrowing of the central canal of the cervical spine, with superimposed degenerative changes most prominent at C5-6, and C4-5, at C5-6, mild central canal stenosis with mild ventral cord flattening, severe bilateral neural foraminal narrowing with encroachment on the C6 dorsal root ganglion, at C4-5, mild central canal stenosis, with mild ventral cord flattening, moderate to severe left neural foraminal narrowing with probable encroachment on the left C5 dorsal root ganglion.  MRI of right shoulder, mild diffuse subscapularis tendinosis, mild to moderate degeneration of the glenoid labrum, advanced chronic arthritis, and the mild synovitis of the acromioclavicular joints, mildly deforms the supraspinatus myotendinous junction, mild diffuse edema of the fat of the rotator interval which could be associated with adhesive capsulitis.  He had left Carpal Tunnel release surgery in Feb 3rd 2016, which has helped his left hand, he has less left hand paresthesia, less weakness.  He has significant gait difficulty, urinary urgency, he has no neck pain, difficulty to keep his head up.  UPDATE Sep 27th 2016: He is alone at visit, he had physical therapy with no significant help, he continues to complains difficulty holding his head up, no limb muscle weakness, no dysarthria, no dysphagia, no double vision.   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 Celebrex [Celecoxib] Other (See Comments)    "  ran my b/p up high, almost had a stroke blood pressure  . Ivp Dye [Iodinated Diagnostic Agents]  Other (See Comments)    "started burning like fire" burning  . Bextra [Valdecoxib] Other (See Comments)    Pt passed out Increase blood pressure  . Chlorpromazine     Pt states he thinks he becomes dizzy at times, but hard to recall at this time  . Ciprofloxacin     Unknown   . Flomax [Tamsulosin Hcl] Other (See Comments)    Blurred vision  . Hyoscyamine Other (See Comments)    "I pass out"  . Imuran [Azathioprine] Other (See Comments)    Patient unable to provide any additional info.  Not sure of this drug at all even after reviewing possible uses. Passed out  . Methotrexate Other (See Comments)    Caused anemia  . Methotrexate Derivatives     Killed red blood cells, zombie like, killed immune system   . Nasal Spray Other (See Comments)    "Blurred vision"  . Penicillins Other (See Comments)    Problems with urination and GI problems    HOME MEDICATIONS: Current Outpatient Prescriptions on File Prior to Visit  Medication Sig Dispense Refill  . acetaminophen (TYLENOL) 500 MG tablet Take 1,000 mg by mouth every 6 (six) hours as needed.    Marland Kitchen atenolol (TENORMIN) 50 MG tablet Take 50 mg by mouth 2 (two) times daily.    Marland Kitchen atropine 1 % ophthalmic solution Place 1 drop into the left eye 2 (two) times daily.    . bimatoprost (LUMIGAN) 0.01 % SOLN Place 1 drop into the right eye at bedtime.    . brimonidine (ALPHAGAN) 0.2 % ophthalmic solution Place 1 drop into the left eye 2 (two) times daily.    . Difluprednate (DUREZOL) 0.05 % EMUL Place 1 drop into the right eye 2 (two) times daily.    . dorzolamide-timolol (COSOPT) 22.3-6.8 MG/ML ophthalmic solution Place 1 drop into the right eye 2 (two) times daily.    Marland Kitchen doxazosin (CARDURA) 8 MG tablet Take 8 mg by mouth at bedtime.     . finasteride (PROSCAR) 5 MG tablet Take 5 mg by mouth daily.    Marland Kitchen losartan-hydrochlorothiazide (HYZAAR) 100-25 MG per tablet Take 1,500 tablets by mouth daily.    Marland Kitchen loteprednol (LOTEMAX) 0.5 % ophthalmic  suspension Place 1 drop into the left eye 2 (two) times daily.    . Multiple Vitamin (MULTIVITAMIN) capsule Take 1 capsule by mouth daily.    . NON FORMULARY Take 1 capsule by mouth daily. B, A, C   Plus    . NON FORMULARY Take 1 tablet by mouth daily. Eye see vitamin    . Saw Palmetto, Serenoa repens, (SAW PALMETTO BERRIES PO) Take 1 capsule by mouth 2 (two) times daily.    . [DISCONTINUED] lisinopril-hydrochlorothiazide (PRINZIDE,ZESTORETIC) 20-25 MG per tablet Take 1 tablet by mouth every morning.      No current facility-administered medications on file prior to visit.    PAST MEDICAL HISTORY: Past Medical History  Diagnosis Date  . HTN (hypertension)   . Otitis media     with intermittent vertigo  . DJD (degenerative joint disease)   . Glaucoma   . Complication of anesthesia     HARD TO WAKE UP  . Weakness     of neck muscles  . Constipation   . Arthritis     PAST SURGICAL HISTORY: Past Surgical History  Procedure Laterality Date  .  Eye surgery  06/10/2009 / 07/17/12    glaucoma   . Back surgery  2000  . Carpal tunnell  1970'S  . Tonsillectomy    . Total knee arthroplasty Left 08/05/2012    Procedure: LEFT TOTAL KNEE ARTHROPLASTY;  Surgeon: Loanne Drilling, MD;  Location: [facility];  Service: Orthopedics;  Laterality: Left;  . Back surgery    . Carpal tunnel Left     03/2014    FAMILY HISTORY: No family history on file.  SOCIAL HISTORY:  History   Social History  . Marital Status: Widowed    Spouse Name: N/A    Number of Children: 3  . Years of Education: 12 th   Occupational History    retired from [employer],    Social History Main Topics  . Smoking status: Never Smoker   . Smokeless tobacco: Never Used  . Alcohol Use: No  . Drug Use: No  . Sexual Activity: Not on file   Other Topics Concern  . Not on file   Social History Narrative   Patient lives at home alone and he is widowed.   Retired.   Education high school.   Right handed.   Caffeine Very  rare soda and one coffee.     PHYSICAL EXAM   Filed Vitals:   11/24/14 1124  BP: 154/79  Pulse: 64  Height: 6\' 4"  (1.93 m)  Weight: 226 lb (102.513 kg)    Not recorded      Body mass index is 27.52 kg/(m^2).  PHYSICAL EXAMNIATION:  Gen: NAD, conversant, well nourised, obese, well groomed                     Cardiovascular: Regular rate rhythm, no peripheral edema, warm, nontender. Eyes: Conjunctivae clear without exudates or hemorrhage Neck: Supple, no carotid bruise. Pulmonary: Clear to auscultation bilaterally  Musculoskeletal: Have a tendency for neck flexion, limited range of motion, maximum neck extension was 90 degree, no significant neck flexion, or neck extension weakness  NEUROLOGICAL EXAM:  MENTAL STATUS: Speech:    Speech is normal; fluent and spontaneous with normal comprehension.  Cognition:    The patient is oriented to person, place, and time;     recent and remote memory intact;     language fluent;     normal attention, concentration,     fund of knowledge.  CRANIAL NERVES: CN II: Visual fields are full to confrontation. Fundoscopic exam is normal with sharp discs and no vascular changes. Venous pulsations are present bilaterally. Pupils are 4 mm and briskly reactive to light. Visual acuity is 20/20 bilaterally. CN III, IV, VI: extraocular movement are normal. No ptosis. CN V: Facial sensation is intact to pinprick in all 3 divisions bilaterally. Corneal responses are intact.  CN VII: Face is symmetric with normal eye closure and smile. CN VIII: Hearing is normal to rubbing fingers CN IX, X: Palate elevates symmetrically. Phonation is normal. CN XI: Head turning and shoulder shrug are intact CN XII: Tongue is midline with normal movements and no atrophy.  MOTOR: Muscle bulk and tone are normal. Muscle strength is normal. Limited range of motion of neck muscles, no neck flexion/extension weakness   REFLEXES: Reflexes are 2+ and symmetric at the  biceps, triceps, knees, and ankles. Plantar responses are flexor.  SENSORY: Light touch, pinprick, position sense, and vibration sense are intact in fingers and toes.  COORDINATION: Rapid alternating movements and fine finger movements are intact. There is no dysmetria on  finger-to-nose and heel-knee-shin. There are no abnormal or extraneous movements.   GAIT/STANCE: He is able to get up from seated position on cross, Right shoulder elevation, mild neck flexion, right knee valgrus   DIAGNOSTIC DATA (LABS, IMAGING, TESTING) - I reviewed patient records, labs, notes, testing and imaging myself where available.  Lab Results  Component Value Date   WBC 4.2 10/08/2013   HGB 13.3 10/08/2013   HCT 37.7* 10/08/2013   MCV 88.7 10/08/2013   PLT 133* 10/08/2013      Component Value Date/Time   NA 141 10/08/2013 1345   K 3.8 10/08/2013 1345   CL 101 10/08/2013 1345   CO2 28 10/08/2013 1345   GLUCOSE 107* 10/08/2013 1345   BUN 13 10/08/2013 1345   CREATININE 1.10 10/08/2013 1345   CALCIUM 10.1 10/08/2013 1345   PROT 7.3 07/29/2012 1525   ALBUMIN 3.6 07/29/2012 1525   AST 24 07/29/2012 1525   ALT 15 07/29/2012 1525   ALKPHOS 74 07/29/2012 1525   BILITOT 0.5 07/29/2012 1525   GFRNONAA 63* 10/08/2013 1345   GFRAA 73* 10/08/2013 1345    ASSESSMENT AND PLAN  [name] is a 79 y.o. male with past medical history of fixed neck flexion, due to cervical degenerative disc disease, no evidence of muscle weakness,  Limited neck movement due to cervical degenerative joint disease Continue neck stretching exercise, as needed NSAIDs, hot compression We will refer him to academic neurology for second opinion per patient's request   Levert Feinstein, M.D. Ph.D.  [facility] [address] [place], [place] [address] [phone]

## 2016-09-01 NOTE — Progress Notes (Signed)
Please place orders in EPIC as patient is being scheduled for a pre-op appointment! Thank you! 

## 2016-09-05 ENCOUNTER — Ambulatory Visit: Payer: Self-pay | Admitting: Orthopedic Surgery

## 2016-09-15 NOTE — Patient Instructions (Signed)
Dave AskewBobby D Zartman  09/15/2016   Your procedure is scheduled on:   09/25/16  Report [facility] Main  Entr[facility]    Follow signs to Short Stay on first floor at    0515AM  Call this number if you have problems the morning of surgery  [phone]   Remember: ONLY 1 PERSON MAY GO WITH YOU TO SHORT STAY TO GET  READY MORNING OF YOUR SURGERY.  Do not eat food or drink liquids :After Midnight.     Take these medicines the morning of surgery with A SIP OF WATER: eye drops as usual, atenelol                                You may not have any metal on your body including hair pins and              piercings  Do not wear jewelry,  lotions, powders or perfumes, deodorant                       Men may shave face and neck.   Do not bring valuables to the hospital. [place] IS NOT             RESPONSIBLE   FOR VALUABLES.  Contacts, dentures or bridgework may not be worn into surgery.  Leave suitcase in the car. After surgery it may be brought to your room.                 Please read over the following fact sheets you were given: _____________________________________________________________________            [facility] - Preparing for [facility] Before surgery, you can play an important role.  Because skin is not sterile, your skin needs to be as free of germs as possible.  You can reduce the number of germs on your skin by washing with CHG (chlorahexidine gluconate) soap before surgery.  CHG is an antiseptic cleaner which kills germs and bonds with the skin to continue killing germs even after washing. Please DO NOT use if you have an allergy to CHG or antibacterial soaps.  If your skin becomes reddened/irritated stop using the CHG and inform your nurse when you arrive at Short Stay. Do not shave (including legs and underarms) for at least 48 hours prior to the first CHG shower.  You may shave your face/neck. Please follow these instructions carefully:  1.  Shower  with CHG Soap the night before surgery and the  morning of Surgery.  2.  If you choose to wash your hair, wash your hair first as usual with your  normal  shampoo.  3.  After you shampoo, rinse your hair and body thoroughly to remove the  shampoo.                           4.  Use CHG as you would any other liquid soap.  You can apply chg directly  to the skin and wash                       Gently with a scrungie or clean washcloth.  5.  Apply the CHG Soap to your body ONLY FROM THE NECK DOWN.   Do  not use on face/ open                           Wound or open sores. Avoid contact with eyes, ears mouth and genitals (private parts).                       Wash face,  Genitals (private parts) with your normal soap.             6.  Wash thoroughly, paying special attention to the area where your surgery  will be performed.  7.  Thoroughly rinse your body with warm water from the neck down.  8.  DO NOT shower/wash with your normal soap after using and rinsing off  the CHG Soap.                9.  Pat yourself dry with a clean towel.            10.  Wear clean pajamas.            11.  Place clean sheets on your bed the night of your first shower and do not  sleep with pets. Day of Surgery : Do not apply any lotions/deodorants the morning of surgery.  Please wear clean clothes to the hospital/surgery center.  FAILURE TO FOLLOW THESE INSTRUCTIONS MAY RESULT IN THE CANCELLATION OF YOUR SURGERY PATIENT SIGNATURE_________________________________  NURSE SIGNATURE__________________________________  ________________________________________________________________________  WHAT IS A BLOOD TRANSFUSION? Blood Transfusion Information  A transfusion is the replacement of blood or some of its parts. Blood is made up of multiple cells which provide different functions.  Red blood cells carry oxygen and are used for blood loss replacement.  White blood cells fight against infection.  Platelets control  bleeding.  Plasma helps clot blood.  Other blood products are available for specialized needs, such as hemophilia or other clotting disorders. BEFORE THE TRANSFUSION  Who gives blood for transfusions?   Healthy volunteers who are fully evaluated to make sure their blood is safe. This is blood bank blood. Transfusion therapy is the safest it has ever been in the practice of medicine. Before blood is taken from a donor, a complete history is taken to make sure that person has no history of diseases nor engages in risky social behavior (examples are intravenous drug use or sexual activity with multiple partners). The donor's travel history is screened to minimize risk of transmitting infections, such as malaria. The donated blood is tested for signs of infectious diseases, such as HIV and hepatitis. The blood is then tested to be sure it is compatible with you in order to minimize the chance of a transfusion reaction. If you or a relative donates blood, this is often done in anticipation of surgery and is not appropriate for emergency situations. It takes many days to process the donated blood. RISKS AND COMPLICATIONS Although transfusion therapy is very safe and saves many lives, the main dangers of transfusion include:   Getting an infectious disease.  Developing a transfusion reaction. This is an allergic reaction to something in the blood you were given. Every precaution is taken to prevent this. The decision to have a blood transfusion has been considered carefully by your caregiver before blood is given. Blood is not given unless the benefits outweigh the risks. AFTER THE TRANSFUSION  Right after receiving a blood transfusion, you will usually feel much better and more energetic. This is especially true  if your red blood cells have gotten low (anemic). The transfusion raises the level of the red blood cells which carry oxygen, and this usually causes an energy increase.  The nurse  administering the transfusion will monitor you carefully for complications. HOME CARE INSTRUCTIONS  No special instructions are needed after a transfusion. You may find your energy is better. Speak with your caregiver about any limitations on activity for underlying diseases you may have. SEEK MEDICAL CARE IF:   Your condition is not improving after your transfusion.  You develop redness or irritation at the intravenous (IV) site. SEEK IMMEDIATE MEDICAL CARE IF:  Any of the following symptoms occur over the next 12 hours:  Shaking chills.  You have a temperature by mouth above 102 F (38.9 C), not controlled by medicine.  Chest, back, or muscle pain.  People around you feel you are not acting correctly or are confused.  Shortness of breath or difficulty breathing.  Dizziness and fainting.  You get a rash or develop hives.  You have a decrease in urine output.  Your urine turns a dark color or changes to pink, red, or brown. Any of the following symptoms occur over the next 10 days:  You have a temperature by mouth above 102 F (38.9 C), not controlled by medicine.  Shortness of breath.  Weakness after normal activity.  The white part of the eye turns yellow (jaundice).  You have a decrease in the amount of urine or are urinating less often.  Your urine turns a dark color or changes to pink, red, or brown. Document Released: 02/11/2000 Document Revised: 05/08/2011 Document Reviewed: 09/30/2007 ExitCare Patient Information 2014 [place].  _______________________________________________________________________  Incentive Spirometer  An incentive spirometer is a tool that can help keep your lungs clear and active. This tool measures how well you are filling your lungs with each breath. Taking long deep breaths may help reverse or decrease the chance of developing breathing (pulmonary) problems (especially infection) following:  A long period of time when you are  unable to move or be active. BEFORE THE PROCEDURE   If the spirometer includes an indicator to show your best effort, your nurse or respiratory therapist will set it to a desired goal.  If possible, sit up straight or lean slightly forward. Try not to slouch.  Hold the incentive spirometer in an upright position. INSTRUCTIONS FOR USE  1. Sit on the edge of your bed if possible, or sit up as far as you can in bed or on a chair. 2. Hold the incentive spirometer in an upright position. 3. Breathe out normally. 4. Place the mouthpiece in your mouth and seal your lips tightly around it. 5. Breathe in slowly and as deeply as possible, raising the piston or the ball toward the top of the column. 6. Hold your breath for 3-5 seconds or for as long as possible. Allow the piston or ball to fall to the bottom of the column. 7. Remove the mouthpiece from your mouth and breathe out normally. 8. Rest for a few seconds and repeat Steps 1 through 7 at least 10 times every 1-2 hours when you are awake. Take your time and take a few normal breaths between deep breaths. 9. The spirometer may include an indicator to show your best effort. Use the indicator as a goal to work toward during each repetition. 10. After each set of 10 deep breaths, practice coughing to be sure your lungs are clear. If you have an  incision (the cut made at the time of surgery), support your incision when coughing by placing a pillow or rolled up towels firmly against it. Once you are able to get out of bed, walk around indoors and cough well. You may stop using the incentive spirometer when instructed by your caregiver.  RISKS AND COMPLICATIONS  Take your time so you do not get dizzy or light-headed.  If you are in pain, you may need to take or ask for pain medication before doing incentive spirometry. It is harder to take a deep breath if you are having pain. AFTER USE  Rest and breathe slowly and easily.  It can be helpful to  keep track of a log of your progress. Your caregiver can provide you with a simple table to help with this. If you are using the spirometer at home, follow these instructions: [place] IF:   You are having difficultly using the spirometer.  You have trouble using the spirometer as often as instructed.  Your pain medication is not giving enough relief while using the spirometer.  You develop fever of 100.5 F (38.1 C) or higher. SEEK IMMEDIATE MEDICAL CARE IF:   You cough up bloody sputum that had not been present before.  You develop fever of 102 F (38.9 C) or greater.  You develop worsening pain at or near the incision site. MAKE SURE YOU:   Understand these instructions.  Will watch your condition.  Will get help right away if you are not doing well or get worse. Document Released: 06/26/2006 Document Revised: 05/08/2011 Document Reviewed: 08/27/2006 [facility] Patient Information 2014 [place], [place].   ________________________________________________________________________

## 2016-09-18 ENCOUNTER — Encounter (HOSPITAL_COMMUNITY)
Admission: RE | Admit: 2016-09-18 | Discharge: 2016-09-18 | Disposition: A | Discharge status: Home / Self Care | Payer: Medicare Other | Source: Ambulatory Visit | Attending: Orthopedic Surgery | Admitting: Orthopedic Surgery

## 2016-09-18 ENCOUNTER — Encounter (HOSPITAL_COMMUNITY): Payer: Self-pay

## 2016-09-18 DIAGNOSIS — Z01812 Encounter for preprocedural laboratory examination: Secondary | ICD-10-CM | POA: Insufficient documentation

## 2016-09-18 DIAGNOSIS — Z01810 Encounter for preprocedural cardiovascular examination: Secondary | ICD-10-CM | POA: Diagnosis not present

## 2016-09-18 HISTORY — DX: Headache, unspecified: R51.9

## 2016-09-18 HISTORY — DX: Personal history of urinary calculi: Z87.442

## 2016-09-18 HISTORY — DX: Anemia, unspecified: D64.9

## 2016-09-18 HISTORY — DX: Headache: R51

## 2016-09-18 HISTORY — DX: Other specified postprocedural states: Z98.890

## 2016-09-18 HISTORY — DX: Cardiac murmur, unspecified: R01.1

## 2016-09-18 HISTORY — DX: Pneumonia, unspecified organism: J18.9

## 2016-09-18 HISTORY — DX: Nausea with vomiting, unspecified: R11.2

## 2016-09-18 LAB — CBC
HCT: 34.9 % — ABNORMAL LOW (ref 39.0–52.0)
Hemoglobin: 12.2 g/dL — ABNORMAL LOW (ref 13.0–17.0)
MCH: 31 pg (ref 26.0–34.0)
MCHC: 35 g/dL (ref 30.0–36.0)
MCV: 88.8 fL (ref 78.0–100.0)
PLATELETS: 170 10*3/uL (ref 150–400)
RBC: 3.93 MIL/uL — AB (ref 4.22–5.81)
RDW: 13.7 % (ref 11.5–15.5)
WBC: 3.9 10*3/uL — ABNORMAL LOW (ref 4.0–10.5)

## 2016-09-18 LAB — COMPREHENSIVE METABOLIC PANEL
ALT: 19 U/L (ref 17–63)
ANION GAP: 5 (ref 5–15)
AST: 29 U/L (ref 15–41)
Albumin: 3.8 g/dL (ref 3.5–5.0)
Alkaline Phosphatase: 67 U/L (ref 38–126)
BUN: 12 mg/dL (ref 6–20)
CHLORIDE: 107 mmol/L (ref 101–111)
CO2: 28 mmol/L (ref 22–32)
CREATININE: 1.08 mg/dL (ref 0.61–1.24)
Calcium: 9.1 mg/dL (ref 8.9–10.3)
GFR calc non Af Amer: >60 mL/min (ref >60)
Glucose, Bld: 87 mg/dL (ref 65–99)
Potassium: 3.8 mmol/L (ref 3.5–5.1)
SODIUM: 140 mmol/L (ref 135–145)
Total Bilirubin: 0.7 mg/dL (ref 0.3–1.2)
Total Protein: 7.3 g/dL (ref 6.5–8.1)

## 2016-09-18 LAB — SURGICAL PCR SCREEN
MRSA, PCR: NEGATIVE
Staphylococcus aureus: NEGATIVE

## 2016-09-18 LAB — PROTIME-INR
INR: 1.17
Prothrombin Time: 14.9 seconds (ref 11.4–15.2)

## 2016-09-18 LAB — APTT: aPTT: 35 seconds (ref 24–36)

## 2016-09-18 NOTE — Progress Notes (Signed)
06/15/16 Dr. Sharyn LullHarwani clearance on chart

## 2016-09-24 ENCOUNTER — Ambulatory Visit: Payer: Self-pay | Admitting: Orthopedic Surgery

## 2016-09-24 NOTE — H&P (Signed)
[name] DOB: 1935/03/02 Widowed / Language: [place] / Race: Black or African American Male Date of Admission  09/25/2016 CC:  Right Knee Pain History of Present Illness The patient is a 81 year old male who comes in for a preoperative History and Physical. The patient is scheduled for a right total knee arthroplasty to be performed by Dr. Gus RankinFrank V. Aluisio, MD at [facility] on 09/25/2016. The patient is a 81 year old male who presented for follow up of their knee. The patient is being followed for their right knee pain and osteoarthritis. Symptoms reported include: pain, swelling, aching, instability and difficulty ambulating. The patient feels that they are doing poorly and report their pain level to be mild to moderate. The following medication has been used for pain control: none. His right knee has gotten progressively worse over time. Injections somewhat were beneficial. He has had a progressively worsening valgus deformity. He has a similar problem with the left knee and responded great to his total knee arthroplasty. AP and lateral of that right knee show bone-on-bone arthritis in all three compartments but far worse laterally with that real significant valgus deformity of the right knee. He has advanced end-stage arthritis of the right knee with progressive pain and dysfunction. At this point, the most predictable means of improving pain and function is total knee arthroplasty. The procedure, risks, potential complications and rehab course are discussed in detail and the patient elects to proceed. They have been treated conservatively in the past for the above stated problem and despite conservative measures, they continue to have progressive pain and severe functional limitations and dysfunction. They have failed non-operative management including home exercise, medications. It is felt that they would benefit from undergoing total joint replacement. Risks and benefits of the  procedure have been discussed with the patient and they elect to proceed with surgery. There are no active contraindications to surgery such as ongoing infection or rapidly progressive neurological disease.   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)  Lumbar pain (M54.5)  Shoulder impingement syndrome (M75.40)  Primary osteoarthritis of right knee (M17.11)  Olecranon bursitis, right elbow (M70.21)  Right elbow pain (M25.521)  Cataract  Macular Degeneration  Dentures  Tinnitus  Impaired Vision  Right Eye Glaucoma  Osteoarthritis  High blood pressure  Cervical spinal stenosis (M48.02)  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  Pseudophakia Bilateral Eyes  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  Bullous Keratopathy Right Eye   Allergies Flomax *GENITOURINARY AGENTS - MISCELLANEOUS*  blurred vision Dye FDC Red 40 (Carmine Red) *PHARMACEUTICAL ADJUVANTS*  CeleBREX *ANALGESICS - ANTI-INFLAMMATORY*  causes hypertension Bextra *ANALGESICS - ANTI-INFLAMMATORY*  causes hypertension Hyoscamine *ULCER DRUGS*  causes patient to pass out ChlorproMAZINE *ANTIPSYCHOTICS/ANTIMANIC AGENTS*  dizziness Cipro *FLUOROQUINOLONES*  Imuran *Miscellaneous Therapeutic Classes**  Methotrexate (Arthritis) *ANALGESICS - ANTI-INFLAMMATORY*  anemia Penicillin VK *PENICILLINS*  GI Problems  Family History  Bleeding disorder  brother Osteoarthritis  mother Hypertension  mother, sister and  brother sister and brother Cerebrovascular Accident  mother Cancer  First Degree Relatives. sister and brother Diabetes Mellitus  brother  Social History Post-Surgical Plans  Plan is to go home following the surgery. Current work status  retired Children  3 Alcohol use  never consumed alcohol 03-02-14 Drug/Alcohol Rehab (Currently)  no Tobacco use  Never smoker. never smoker 03-02-14 Tobacco / smoke exposure  no yes outdoors only 03-02-14 Illicit drug use  no Exercise  Exercises daily; does other Drug/Alcohol Rehab (Previously)  no Pain Contract  no Marital status  widowed Living situation  live alone  Medication History  Acular LS (0.4% Solution, Ophthalmic) Active. AmLODIPine Besylate (5MG  Tablet, Oral) Active. Aspirin EC (81MG  Tablet DR, Oral) Active. Atenolol (50MG  Tablet, 1/2 Oral daily) Active. Brimonidine Tartrate (0.2% Solution, one drop Ophthalmic each eye daily) Active. Cetirizine HCl (10MG  Tablet, Oral daily) Active. Cholecalciferol (2000UNIT Tablet, Oral daily) Active. Cyclogyl (1% Solution, one drop Ophthalmic right eye twice a day) Active. Dorzolamide HCl (2% Solution, One drop Ophthalmic Both eyes twice a day) Active. Dorzolamide HCl-Timolol Mal (22.3-6.8MG /ML Solution, One Drop Ophthalmic Both eyes twice a day) Active. Doxazosin Mesylate (8MG  Tablet, Oral daily) Active. Finasteride (5MG  Tablet, Oral daily) Active. Losartan Potassium-HCTZ (100-25MG  Tablet, Oral daily) Active. Saw Palmetto Extract (Oral) Specific strength unknown - Active. Timolol Maleate (Ophthalmic) Specific strength unknown - Active. BAC+ Active. (supplement for artery health) EVERY MAN 2 MVI Active. EYE BRIGHT Active. JOINT AID Active. XANGO JUICE Active.  Past Surgical History Eye Surgery  Total Knee Replacement  left Spinal Surgery  Cataract Surgery  bilateral    Review of Systems General Not Present- Chills, Fatigue, Fever, Memory Loss, Night  Sweats, Weight Gain and Weight Loss. Skin Not Present- Eczema, Hives, Itching, Lesions and Rash. HEENT Not Present- Dentures, Double Vision, Headache, Hearing Loss, Tinnitus and Visual Loss. Respiratory Not Present- Allergies, Chronic Cough, Coughing up blood, Shortness of breath at rest and Shortness of breath with exertion. Cardiovascular Not Present- Chest Pain, Difficulty Breathing Lying Down, Murmur, Palpitations, Racing/skipping heartbeats and Swelling. Gastrointestinal Not Present- Abdominal Pain, Bloody Stool, Constipation, Diarrhea, Difficulty Swallowing, Heartburn, Jaundice, Loss of appetitie, Nausea and Vomiting. Male Genitourinary Not Present- Blood in Urine, Discharge, Flank Pain, Incontinence, Painful Urination, Urgency, Urinary frequency, Urinary Retention, Urinating at Night and Weak urinary stream. Musculoskeletal Present- Joint Pain and Morning Stiffness. Not Present- Back Pain, Joint Swelling, Muscle Pain, Muscle Weakness and Spasms. Neurological Not Present- Blackout spells, Difficulty with balance, Dizziness, Paralysis, Tremor and Weakness. Psychiatric Not Present- Insomnia.  Vitals Weight: 217 lb Height: 75in Weight was reported by patient. Height was reported by patient. Body Surface Area: 2.27 m Body Mass Index: 27.12 kg/m  Pulse: 56 (Regular)  Resp.: 14 (Unlabored)  BP: 132/56 (Sitting, Left Arm, Standard)       Physical Exam  General Mental Status -Alert, cooperative and good historian. General Appearance-pleasant, Not in acute distress. Orientation-Oriented X3. Build & Nutrition-Well nourished and Well developed.  Head and Neck Head-normocephalic, atraumatic . Neck Global Assessment - supple, no bruit auscultated on the right, no bruit auscultated on the left.  Eye Vision-Decreased(right eye) and Wears contact lenses(left eye). Upper Eyelid - Right -Note: ptosis right eye.  Motion - Bilateral-EOMI.  Chest and Lung  Exam Auscultation Breath sounds - clear at anterior chest wall and clear at posterior chest wall. Adventitious sounds - No Adventitious sounds.  Cardiovascular Auscultation Rhythm - Regular rate and rhythm. Heart Sounds - S1 WNL and S2 WNL. Murmurs & Other Heart Sounds - Auscultation of the heart reveals - No Murmurs.  Abdomen Palpation/Percussion Tenderness - Abdomen is non-tender to palpation. Rigidity (guarding) - Abdomen is soft. Auscultation Auscultation of the  abdomen reveals - Bowel sounds normal.  Male Genitourinary Note: Not done, not pertinent to present illness   Musculoskeletal Note: His right knee shows no effusion. He has a tremendous valgus deformity of over 25 degrees. His range of motion is about 5 to 125. He is tender lateral greater than medial with no instability noted.  RADIOGRAPHS I reviewed his radiographs. AP and lateral of that right knee show bone-on-bone arthritis in all three compartments but far worse laterally with that real significant valgus deformity of the right knee.   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 Lynann Bologna Cards: Dr. Sharyn Lull  IV TXA  Anesthesia Issues: None but slow to wake up  Patient was instructed on what medications to stop prior to surgery.  Signed electronically by Lauraine Rinne, III PA-C

## 2016-09-25 ENCOUNTER — Encounter (HOSPITAL_COMMUNITY): Payer: Self-pay

## 2016-09-25 ENCOUNTER — Inpatient Hospital Stay (HOSPITAL_COMMUNITY): Payer: Medicare Other | Admitting: Certified Registered Nurse Anesthetist

## 2016-09-25 ENCOUNTER — Inpatient Hospital Stay (HOSPITAL_COMMUNITY)
Admission: RE | Admit: 2016-09-25 | Discharge: 2016-09-28 | DRG: 470 | Disposition: A | Discharge status: Home / Self Care | Payer: Medicare Other | Source: Ambulatory Visit | Attending: Orthopedic Surgery | Admitting: Orthopedic Surgery

## 2016-09-25 ENCOUNTER — Encounter (HOSPITAL_COMMUNITY)
Admission: RE | Disposition: A | Discharge status: Home / Self Care | Payer: Self-pay | Source: Ambulatory Visit | Attending: Orthopedic Surgery

## 2016-09-25 DIAGNOSIS — Z9889 Other specified postprocedural states: Secondary | ICD-10-CM | POA: Diagnosis not present

## 2016-09-25 DIAGNOSIS — Z823 Family history of stroke: Secondary | ICD-10-CM | POA: Diagnosis not present

## 2016-09-25 DIAGNOSIS — H409 Unspecified glaucoma: Secondary | ICD-10-CM | POA: Diagnosis present

## 2016-09-25 DIAGNOSIS — M4802 Spinal stenosis, cervical region: Secondary | ICD-10-CM | POA: Diagnosis present

## 2016-09-25 DIAGNOSIS — Z8249 Family history of ischemic heart disease and other diseases of the circulatory system: Secondary | ICD-10-CM | POA: Diagnosis not present

## 2016-09-25 DIAGNOSIS — M48061 Spinal stenosis, lumbar region without neurogenic claudication: Secondary | ICD-10-CM | POA: Diagnosis present

## 2016-09-25 DIAGNOSIS — I10 Essential (primary) hypertension: Secondary | ICD-10-CM | POA: Diagnosis present

## 2016-09-25 DIAGNOSIS — Z832 Family history of diseases of the blood and blood-forming organs and certain disorders involving the immune mechanism: Secondary | ICD-10-CM

## 2016-09-25 DIAGNOSIS — Z961 Presence of intraocular lens: Secondary | ICD-10-CM | POA: Diagnosis present

## 2016-09-25 DIAGNOSIS — M1710 Unilateral primary osteoarthritis, unspecified knee: Secondary | ICD-10-CM | POA: Diagnosis present

## 2016-09-25 DIAGNOSIS — M47812 Spondylosis without myelopathy or radiculopathy, cervical region: Secondary | ICD-10-CM | POA: Diagnosis present

## 2016-09-25 DIAGNOSIS — M21061 Valgus deformity, not elsewhere classified, right knee: Secondary | ICD-10-CM | POA: Diagnosis present

## 2016-09-25 DIAGNOSIS — Z79899 Other long term (current) drug therapy: Secondary | ICD-10-CM | POA: Diagnosis not present

## 2016-09-25 DIAGNOSIS — Z87442 Personal history of urinary calculi: Secondary | ICD-10-CM | POA: Diagnosis not present

## 2016-09-25 DIAGNOSIS — R410 Disorientation, unspecified: Secondary | ICD-10-CM

## 2016-09-25 DIAGNOSIS — M1711 Unilateral primary osteoarthritis, right knee: Secondary | ICD-10-CM

## 2016-09-25 DIAGNOSIS — M19021 Primary osteoarthritis, right elbow: Secondary | ICD-10-CM | POA: Diagnosis present

## 2016-09-25 DIAGNOSIS — Z809 Family history of malignant neoplasm, unspecified: Secondary | ICD-10-CM | POA: Diagnosis not present

## 2016-09-25 DIAGNOSIS — G5601 Carpal tunnel syndrome, right upper limb: Secondary | ICD-10-CM | POA: Diagnosis present

## 2016-09-25 DIAGNOSIS — Z7982 Long term (current) use of aspirin: Secondary | ICD-10-CM

## 2016-09-25 DIAGNOSIS — Z8261 Family history of arthritis: Secondary | ICD-10-CM | POA: Diagnosis not present

## 2016-09-25 DIAGNOSIS — H3530 Unspecified macular degeneration: Secondary | ICD-10-CM | POA: Diagnosis present

## 2016-09-25 DIAGNOSIS — Z833 Family history of diabetes mellitus: Secondary | ICD-10-CM | POA: Diagnosis not present

## 2016-09-25 DIAGNOSIS — Z96652 Presence of left artificial knee joint: Secondary | ICD-10-CM | POA: Diagnosis present

## 2016-09-25 DIAGNOSIS — M19011 Primary osteoarthritis, right shoulder: Secondary | ICD-10-CM | POA: Diagnosis present

## 2016-09-25 DIAGNOSIS — M179 Osteoarthritis of knee, unspecified: Secondary | ICD-10-CM | POA: Diagnosis present

## 2016-09-25 DIAGNOSIS — M5030 Other cervical disc degeneration, unspecified cervical region: Secondary | ICD-10-CM | POA: Diagnosis present

## 2016-09-25 HISTORY — PX: TOTAL KNEE ARTHROPLASTY: SHX125

## 2016-09-25 LAB — TYPE AND SCREEN
ABO/RH(D): B POS
ANTIBODY SCREEN: NEGATIVE

## 2016-09-25 SURGERY — ARTHROPLASTY, KNEE, TOTAL
Anesthesia: General | Site: Knee | Laterality: Right

## 2016-09-25 MED ORDER — AMLODIPINE BESYLATE 5 MG PO TABS
5.0000 mg | ORAL_TABLET | Freq: Every day | ORAL | Status: DC
  Administered 2016-09-25 – 2016-09-27 (×3): 5 mg via ORAL
  Filled 2016-09-25 (×3): qty 1

## 2016-09-25 MED ORDER — ONDANSETRON HCL 4 MG PO TABS: 4.0000 mg | ORAL_TABLET | Freq: Four times a day (QID) | ORAL | Status: DC | PRN

## 2016-09-25 MED ORDER — DEXAMETHASONE SODIUM PHOSPHATE 10 MG/ML IJ SOLN
INTRAMUSCULAR | Status: DC | PRN
  Administered 2016-09-25: 10 mg via INTRAVENOUS

## 2016-09-25 MED ORDER — SODIUM CHLORIDE 0.9 % IV SOLN
INTRAVENOUS | Status: DC
  Administered 2016-09-25 – 2016-09-26 (×2): via INTRAVENOUS

## 2016-09-25 MED ORDER — FENTANYL CITRATE (PF) 100 MCG/2ML IJ SOLN
INTRAMUSCULAR | Status: DC | PRN
  Administered 2016-09-25 (×4): 50 ug via INTRAVENOUS

## 2016-09-25 MED ORDER — METHOCARBAMOL 1000 MG/10ML IJ SOLN
500.0000 mg | Freq: Four times a day (QID) | INTRAVENOUS | Status: DC | PRN
  Administered 2016-09-25: 500 mg via INTRAVENOUS
  Filled 2016-09-25: qty 550

## 2016-09-25 MED ORDER — PREDNISOLONE ACETATE 1 % OP SUSP
1.0000 [drp] | Freq: Every day | OPHTHALMIC | Status: DC
  Administered 2016-09-26 – 2016-09-28 (×3): 1 [drp] via OPHTHALMIC
  Filled 2016-09-25: qty 5

## 2016-09-25 MED ORDER — MEPERIDINE HCL 50 MG/ML IJ SOLN: 6.2500 mg | INTRAMUSCULAR | Status: DC | PRN

## 2016-09-25 MED ORDER — METOCLOPRAMIDE HCL 5 MG PO TABS: 5.0000 mg | ORAL_TABLET | Freq: Three times a day (TID) | ORAL | Status: DC | PRN

## 2016-09-25 MED ORDER — MIDAZOLAM HCL 5 MG/5ML IJ SOLN
INTRAMUSCULAR | Status: DC | PRN
  Administered 2016-09-25: 2 mg via INTRAVENOUS

## 2016-09-25 MED ORDER — OXYCODONE HCL 5 MG PO TABS
5.0000 mg | ORAL_TABLET | ORAL | Status: DC | PRN
  Administered 2016-09-25 (×2): 10 mg via ORAL
  Administered 2016-09-25 (×2): 5 mg via ORAL
  Administered 2016-09-26 (×2): 10 mg via ORAL
  Filled 2016-09-25 (×4): qty 2
  Filled 2016-09-25 (×2): qty 1

## 2016-09-25 MED ORDER — BUPIVACAINE LIPOSOME 1.3 % IJ SUSP
INTRAMUSCULAR | Status: DC | PRN
  Administered 2016-09-25: 20 mL

## 2016-09-25 MED ORDER — ACETAMINOPHEN 500 MG PO TABS
1000.0000 mg | ORAL_TABLET | Freq: Four times a day (QID) | ORAL | Status: AC
  Administered 2016-09-25 (×3): 1000 mg via ORAL
  Filled 2016-09-25 (×4): qty 2

## 2016-09-25 MED ORDER — LOSARTAN POTASSIUM 50 MG PO TABS
100.0000 mg | ORAL_TABLET | Freq: Every day | ORAL | Status: DC
  Filled 2016-09-25 (×2): qty 2

## 2016-09-25 MED ORDER — LIDOCAINE 2% (20 MG/ML) 5 ML SYRINGE
INTRAMUSCULAR | Status: AC
  Filled 2016-09-25: qty 5

## 2016-09-25 MED ORDER — FENTANYL CITRATE (PF) 100 MCG/2ML IJ SOLN
INTRAMUSCULAR | Status: AC
  Filled 2016-09-25: qty 2

## 2016-09-25 MED ORDER — FINASTERIDE 5 MG PO TABS
5.0000 mg | ORAL_TABLET | Freq: Every day | ORAL | Status: DC
  Administered 2016-09-25 – 2016-09-27 (×3): 5 mg via ORAL
  Filled 2016-09-25 (×3): qty 1

## 2016-09-25 MED ORDER — LOSARTAN POTASSIUM-HCTZ 100-25 MG PO TABS: 1.0000 | ORAL_TABLET | Freq: Every day | ORAL | Status: DC

## 2016-09-25 MED ORDER — ACETAMINOPHEN 10 MG/ML IV SOLN
1000.0000 mg | Freq: Once | INTRAVENOUS | Status: AC
  Administered 2016-09-25: 1000 mg via INTRAVENOUS

## 2016-09-25 MED ORDER — TRANEXAMIC ACID 1000 MG/10ML IV SOLN
1000.0000 mg | INTRAVENOUS | Status: AC
  Administered 2016-09-25: 1000 mg via INTRAVENOUS
  Filled 2016-09-25: qty 1100

## 2016-09-25 MED ORDER — PHENOL 1.4 % MT LIQD: 1.0000 | OROMUCOSAL | Status: DC | PRN

## 2016-09-25 MED ORDER — ONDANSETRON HCL 4 MG/2ML IJ SOLN
INTRAMUSCULAR | Status: DC | PRN
  Administered 2016-09-25: 4 mg via INTRAVENOUS

## 2016-09-25 MED ORDER — BISACODYL 10 MG RE SUPP: 10.0000 mg | Freq: Every day | RECTAL | Status: DC | PRN

## 2016-09-25 MED ORDER — PROPOFOL 10 MG/ML IV BOLUS
INTRAVENOUS | Status: DC | PRN
  Administered 2016-09-25: 150 mg via INTRAVENOUS

## 2016-09-25 MED ORDER — FENTANYL CITRATE (PF) 100 MCG/2ML IJ SOLN
25.0000 ug | INTRAMUSCULAR | Status: DC | PRN
  Administered 2016-09-25: 50 ug via INTRAVENOUS

## 2016-09-25 MED ORDER — SODIUM CHLORIDE 0.9 % IJ SOLN
INTRAMUSCULAR | Status: AC
  Filled 2016-09-25: qty 10

## 2016-09-25 MED ORDER — SODIUM CHLORIDE 0.9 % IJ SOLN
INTRAMUSCULAR | Status: AC
  Filled 2016-09-25: qty 50

## 2016-09-25 MED ORDER — SODIUM CHLORIDE 0.9 % IJ SOLN
INTRAMUSCULAR | Status: DC | PRN
Start: 2016-09-25 — End: 2016-09-25
  Administered 2016-09-25: 60 mL

## 2016-09-25 MED ORDER — ATROPINE SULFATE 1 MG/10ML IJ SOSY
PREFILLED_SYRINGE | INTRAMUSCULAR | Status: AC
  Filled 2016-09-25: qty 10

## 2016-09-25 MED ORDER — METOCLOPRAMIDE HCL 5 MG/ML IJ SOLN
5.0000 mg | Freq: Three times a day (TID) | INTRAMUSCULAR | Status: DC | PRN
  Administered 2016-09-25: 10 mg via INTRAVENOUS
  Filled 2016-09-25: qty 2

## 2016-09-25 MED ORDER — GLYCOPYRROLATE 0.2 MG/ML IJ SOLN
INTRAMUSCULAR | Status: DC | PRN
  Administered 2016-09-25: 0.4 mg via INTRAVENOUS
  Administered 2016-09-25: 0.2 mg via INTRAVENOUS

## 2016-09-25 MED ORDER — MIDAZOLAM HCL 2 MG/2ML IJ SOLN
INTRAMUSCULAR | Status: AC
  Filled 2016-09-25: qty 2

## 2016-09-25 MED ORDER — BUPIVACAINE LIPOSOME 1.3 % IJ SUSP
20.0000 mL | Freq: Once | INTRAMUSCULAR | Status: DC
  Filled 2016-09-25: qty 20

## 2016-09-25 MED ORDER — METHOCARBAMOL 500 MG PO TABS
500.0000 mg | ORAL_TABLET | Freq: Four times a day (QID) | ORAL | Status: DC | PRN
  Administered 2016-09-25: 500 mg via ORAL
  Filled 2016-09-25: qty 1

## 2016-09-25 MED ORDER — TRANEXAMIC ACID 1000 MG/10ML IV SOLN
1000.0000 mg | Freq: Once | INTRAVENOUS | Status: AC
  Administered 2016-09-25: 1000 mg via INTRAVENOUS
  Filled 2016-09-25: qty 1100

## 2016-09-25 MED ORDER — MORPHINE SULFATE (PF) 4 MG/ML IV SOLN
1.0000 mg | INTRAVENOUS | Status: DC | PRN
  Administered 2016-09-25 (×2): 1 mg via INTRAVENOUS
  Filled 2016-09-25 (×2): qty 1

## 2016-09-25 MED ORDER — CEFAZOLIN SODIUM-DEXTROSE 2-4 GM/100ML-% IV SOLN
2.0000 g | Freq: Four times a day (QID) | INTRAVENOUS | Status: AC
  Administered 2016-09-25 (×2): 2 g via INTRAVENOUS
  Filled 2016-09-25 (×2): qty 100

## 2016-09-25 MED ORDER — TRAMADOL HCL 50 MG PO TABS: 50.0000 mg | ORAL_TABLET | Freq: Four times a day (QID) | ORAL | Status: DC | PRN

## 2016-09-25 MED ORDER — ONDANSETRON HCL 4 MG/2ML IJ SOLN
4.0000 mg | Freq: Four times a day (QID) | INTRAMUSCULAR | Status: DC | PRN
  Administered 2016-09-25 (×2): 4 mg via INTRAVENOUS
  Filled 2016-09-25 (×2): qty 2

## 2016-09-25 MED ORDER — CEFAZOLIN SODIUM-DEXTROSE 2-4 GM/100ML-% IV SOLN
2.0000 g | INTRAVENOUS | Status: AC
  Administered 2016-09-25: 2 g via INTRAVENOUS

## 2016-09-25 MED ORDER — ACETAMINOPHEN 325 MG PO TABS
650.0000 mg | ORAL_TABLET | Freq: Four times a day (QID) | ORAL | Status: DC | PRN
Start: 2016-09-26 — End: 2016-09-28
  Administered 2016-09-26 – 2016-09-28 (×5): 650 mg via ORAL
  Filled 2016-09-25 (×5): qty 2

## 2016-09-25 MED ORDER — SODIUM CHLORIDE 0.9 % IR SOLN
Status: DC | PRN
  Administered 2016-09-25: 1000 mL

## 2016-09-25 MED ORDER — BUPIVACAINE-EPINEPHRINE (PF) 0.5% -1:200000 IJ SOLN
INTRAMUSCULAR | Status: DC | PRN
  Administered 2016-09-25: 30 mL via PERINEURAL

## 2016-09-25 MED ORDER — LOSARTAN POTASSIUM 50 MG PO TABS
100.0000 mg | ORAL_TABLET | Freq: Every day | ORAL | Status: DC
  Administered 2016-09-27 – 2016-09-28 (×2): 100 mg via ORAL
  Filled 2016-09-25 (×3): qty 2

## 2016-09-25 MED ORDER — BRIMONIDINE TARTRATE 0.2 % OP SOLN
1.0000 [drp] | Freq: Two times a day (BID) | OPHTHALMIC | Status: DC
  Administered 2016-09-25 – 2016-09-28 (×6): 1 [drp] via OPHTHALMIC
  Filled 2016-09-25: qty 5

## 2016-09-25 MED ORDER — DEXAMETHASONE SODIUM PHOSPHATE 10 MG/ML IJ SOLN
10.0000 mg | Freq: Once | INTRAMUSCULAR | Status: AC
  Administered 2016-09-26: 10 mg via INTRAVENOUS
  Filled 2016-09-25: qty 1

## 2016-09-25 MED ORDER — ACETAMINOPHEN 10 MG/ML IV SOLN
INTRAVENOUS | Status: AC
  Filled 2016-09-25: qty 100

## 2016-09-25 MED ORDER — ATENOLOL 25 MG PO TABS
25.0000 mg | ORAL_TABLET | Freq: Two times a day (BID) | ORAL | Status: DC
  Administered 2016-09-25 – 2016-09-28 (×6): 25 mg via ORAL
  Filled 2016-09-25 (×7): qty 1

## 2016-09-25 MED ORDER — DEXAMETHASONE SODIUM PHOSPHATE 10 MG/ML IJ SOLN
INTRAMUSCULAR | Status: AC
  Filled 2016-09-25: qty 1

## 2016-09-25 MED ORDER — FLEET ENEMA 7-19 GM/118ML RE ENEM: 1.0000 | ENEMA | Freq: Once | RECTAL | Status: DC | PRN

## 2016-09-25 MED ORDER — HYDRALAZINE HCL 20 MG/ML IJ SOLN
5.0000 mg | Freq: Once | INTRAMUSCULAR | Status: AC
  Administered 2016-09-25: 5 mg via INTRAVENOUS

## 2016-09-25 MED ORDER — DEXAMETHASONE SODIUM PHOSPHATE 10 MG/ML IJ SOLN: 10.0000 mg | Freq: Once | INTRAMUSCULAR | Status: DC

## 2016-09-25 MED ORDER — CEFAZOLIN SODIUM-DEXTROSE 2-4 GM/100ML-% IV SOLN
INTRAVENOUS | Status: AC
  Filled 2016-09-25: qty 100

## 2016-09-25 MED ORDER — METOCLOPRAMIDE HCL 5 MG/ML IJ SOLN: 10.0000 mg | Freq: Once | INTRAMUSCULAR | Status: DC | PRN

## 2016-09-25 MED ORDER — MENTHOL 3 MG MT LOZG: 1.0000 | LOZENGE | OROMUCOSAL | Status: DC | PRN

## 2016-09-25 MED ORDER — PROPOFOL 10 MG/ML IV BOLUS
INTRAVENOUS | Status: AC
  Filled 2016-09-25: qty 40

## 2016-09-25 MED ORDER — ACETAMINOPHEN 650 MG RE SUPP: 650.0000 mg | Freq: Four times a day (QID) | RECTAL | Status: DC | PRN

## 2016-09-25 MED ORDER — DORZOLAMIDE HCL-TIMOLOL MAL 2-0.5 % OP SOLN
1.0000 [drp] | Freq: Two times a day (BID) | OPHTHALMIC | Status: DC
  Administered 2016-09-25 – 2016-09-28 (×6): 1 [drp] via OPHTHALMIC
  Filled 2016-09-25: qty 10

## 2016-09-25 MED ORDER — ONDANSETRON HCL 4 MG/2ML IJ SOLN
INTRAMUSCULAR | Status: AC
  Filled 2016-09-25: qty 2

## 2016-09-25 MED ORDER — HYDROCHLOROTHIAZIDE 25 MG PO TABS
25.0000 mg | ORAL_TABLET | Freq: Every day | ORAL | Status: DC
  Filled 2016-09-25 (×3): qty 1

## 2016-09-25 MED ORDER — DIPHENHYDRAMINE HCL 12.5 MG/5ML PO ELIX: 12.5000 mg | ORAL_SOLUTION | ORAL | Status: DC | PRN

## 2016-09-25 MED ORDER — LACTATED RINGERS IV SOLN
INTRAVENOUS | Status: DC
  Administered 2016-09-25 (×3): via INTRAVENOUS

## 2016-09-25 MED ORDER — DOXAZOSIN MESYLATE 8 MG PO TABS
8.0000 mg | ORAL_TABLET | Freq: Every day | ORAL | Status: DC
  Administered 2016-09-25 – 2016-09-27 (×3): 8 mg via ORAL
  Filled 2016-09-25 (×3): qty 1

## 2016-09-25 MED ORDER — CHLORHEXIDINE GLUCONATE 4 % EX LIQD: 60.0000 mL | Freq: Once | CUTANEOUS | Status: DC

## 2016-09-25 MED ORDER — RIVAROXABAN 10 MG PO TABS
10.0000 mg | ORAL_TABLET | Freq: Every day | ORAL | Status: DC
  Administered 2016-09-26 – 2016-09-28 (×3): 10 mg via ORAL
  Filled 2016-09-25 (×3): qty 1

## 2016-09-25 MED ORDER — POLYETHYLENE GLYCOL 3350 17 G PO PACK: 17.0000 g | PACK | Freq: Every day | ORAL | Status: DC | PRN

## 2016-09-25 MED ORDER — GLYCOPYRROLATE 0.2 MG/ML IV SOSY
PREFILLED_SYRINGE | INTRAVENOUS | Status: AC
  Filled 2016-09-25: qty 10

## 2016-09-25 MED ORDER — DOCUSATE SODIUM 100 MG PO CAPS
100.0000 mg | ORAL_CAPSULE | Freq: Two times a day (BID) | ORAL | Status: DC
  Administered 2016-09-25 – 2016-09-28 (×6): 100 mg via ORAL
  Filled 2016-09-25 (×6): qty 1

## 2016-09-25 MED ORDER — HYDRALAZINE HCL 20 MG/ML IJ SOLN
INTRAMUSCULAR | Status: AC
  Filled 2016-09-25: qty 1

## 2016-09-25 MED ORDER — HYDROCHLOROTHIAZIDE 25 MG PO TABS
25.0000 mg | ORAL_TABLET | Freq: Every day | ORAL | Status: DC
  Administered 2016-09-27 – 2016-09-28 (×2): 25 mg via ORAL
  Filled 2016-09-25 (×2): qty 1

## 2016-09-25 SURGICAL SUPPLY — 51 items
BAG DECANTER FOR FLEXI CONT (MISCELLANEOUS) ×2 IMPLANT
BAG SPEC THK2 15X12 ZIP CLS (MISCELLANEOUS) ×1
BAG ZIPLOCK 12X15 (MISCELLANEOUS) ×2 IMPLANT
BANDAGE ACE 6X5 VEL STRL LF (GAUZE/BANDAGES/DRESSINGS) ×2 IMPLANT
BANDAGE ELASTIC 6 VELCRO ST LF (GAUZE/BANDAGES/DRESSINGS) ×2 IMPLANT
BLADE SAG 18X100X1.27 (BLADE) ×2 IMPLANT
BLADE SAW SGTL 11.0X1.19X90.0M (BLADE) ×2 IMPLANT
BOWL SMART MIX CTS (DISPOSABLE) ×2 IMPLANT
CAP KNEE TOTAL 3 SIGMA ×2 IMPLANT
CEMENT HV SMART SET (Cement) ×4 IMPLANT
COVER SURGICAL LIGHT HANDLE (MISCELLANEOUS) ×2 IMPLANT
CUFF TOURN SGL QUICK 34 (TOURNIQUET CUFF) ×2
CUFF TRNQT CYL 34X4X40X1 (TOURNIQUET CUFF) ×1 IMPLANT
DECANTER SPIKE VIAL GLASS SM (MISCELLANEOUS) ×2 IMPLANT
DRAPE U-SHAPE 47X51 STRL (DRAPES) ×2 IMPLANT
DRSG ADAPTIC 3X8 NADH LF (GAUZE/BANDAGES/DRESSINGS) ×2 IMPLANT
DRSG PAD ABDOMINAL 8X10 ST (GAUZE/BANDAGES/DRESSINGS) ×2 IMPLANT
DURAPREP 26ML APPLICATOR (WOUND CARE) ×2 IMPLANT
ELECT REM PT RETURN 15FT ADLT (MISCELLANEOUS) ×2 IMPLANT
EVACUATOR 1/8 PVC DRAIN (DRAIN) ×2 IMPLANT
GAUZE SPONGE 4X4 12PLY STRL (GAUZE/BANDAGES/DRESSINGS) ×2 IMPLANT
GLOVE BIO SURGEON STRL SZ7.5 (GLOVE) IMPLANT
GLOVE BIO SURGEON STRL SZ8 (GLOVE) ×2 IMPLANT
GLOVE BIOGEL PI IND STRL 6.5 (GLOVE) IMPLANT
GLOVE BIOGEL PI IND STRL 8 (GLOVE) ×1 IMPLANT
GLOVE BIOGEL PI INDICATOR 6.5 (GLOVE)
GLOVE BIOGEL PI INDICATOR 8 (GLOVE) ×1
GLOVE SURG SS PI 6.5 STRL IVOR (GLOVE) IMPLANT
GOWN STRL REUS W/TWL LRG LVL3 (GOWN DISPOSABLE) ×2 IMPLANT
GOWN STRL REUS W/TWL XL LVL3 (GOWN DISPOSABLE) IMPLANT
HANDPIECE INTERPULSE COAX TIP (DISPOSABLE) ×2
IMMOBILIZER KNEE 20 (SOFTGOODS) ×2
IMMOBILIZER KNEE 20 THIGH 36 (SOFTGOODS) ×1 IMPLANT
MANIFOLD NEPTUNE II (INSTRUMENTS) ×2 IMPLANT
NS IRRIG 1000ML POUR BTL (IV SOLUTION) ×2 IMPLANT
PACK TOTAL KNEE CUSTOM (KITS) ×2 IMPLANT
PAD ABD 7.5X8 STRL (GAUZE/BANDAGES/DRESSINGS) ×2 IMPLANT
PADDING CAST COTTON 6X4 STRL (CAST SUPPLIES) ×4 IMPLANT
POSITIONER SURGICAL ARM (MISCELLANEOUS) ×2 IMPLANT
SET HNDPC FAN SPRY TIP SCT (DISPOSABLE) ×1 IMPLANT
STRIP CLOSURE SKIN 1/2X4 (GAUZE/BANDAGES/DRESSINGS) ×3 IMPLANT
SUT MNCRL AB 4-0 PS2 18 (SUTURE) ×2 IMPLANT
SUT STRATAFIX 0 PDS 27 VIOLET (SUTURE) ×2
SUT VIC AB 2-0 CT1 27 (SUTURE) ×6
SUT VIC AB 2-0 CT1 TAPERPNT 27 (SUTURE) ×3 IMPLANT
SUTURE STRATFX 0 PDS 27 VIOLET (SUTURE) ×1 IMPLANT
SYR 30ML LL (SYRINGE) ×4 IMPLANT
TRAY FOLEY W/METER SILVER 16FR (SET/KITS/TRAYS/PACK) ×2 IMPLANT
WATER STERILE IRR 1000ML POUR (IV SOLUTION) ×4 IMPLANT
WRAP KNEE MAXI GEL POST OP (GAUZE/BANDAGES/DRESSINGS) ×2 IMPLANT
YANKAUER SUCT BULB TIP 10FT TU (MISCELLANEOUS) ×2 IMPLANT

## 2016-09-25 NOTE — Anesthesia Preprocedure Evaluation (Signed)
Anesthesia Evaluation  Patient identified by MRN, date of birth, ID band Patient awake    Reviewed: Allergy & Precautions, NPO status , Patient's Chart, lab work & pertinent test results  History of Anesthesia Complications (+) PONV  Airway Mallampati: II  TM Distance: >3 FB Neck ROM: Full    Dental no notable dental hx.    Pulmonary neg pulmonary ROS,    Pulmonary exam normal breath sounds clear to auscultation       Cardiovascular hypertension, Pt. on medications Normal cardiovascular exam Rhythm:Regular Rate:Normal     Neuro/Psych negative neurological ROS  negative psych ROS   GI/Hepatic negative GI ROS, Neg liver ROS,   Endo/Other  negative endocrine ROS  Renal/GU negative Renal ROS  negative genitourinary   Musculoskeletal negative musculoskeletal ROS (+)   Abdominal   Peds negative pediatric ROS (+)  Hematology negative hematology ROS (+)   Anesthesia Other Findings   Reproductive/Obstetrics negative OB ROS                             Anesthesia Physical Anesthesia Plan  ASA: II  Anesthesia Plan: General   Post-op Pain Management: GA combined w/ Regional for post-op pain   Induction: Intravenous  PONV Risk Score and Plan: 3 and Ondansetron, Dexamethasone, Midazolam and Propofol infusion  Airway Management Planned: Oral ETT  Additional Equipment:   Intra-op Plan:   Post-operative Plan: Extubation in OR  Informed Consent: I have reviewed the patients History and Physical, chart, labs and discussed the procedure including the risks, benefits and alternatives for the proposed anesthesia with the patient or authorized representative who has indicated his/her understanding and acceptance.   Dental advisory given  Plan Discussed with: CRNA  Anesthesia Plan Comments: (Previous GA Adductor block too.)        Anesthesia Quick Evaluation

## 2016-09-25 NOTE — Op Note (Signed)
OPERATIVE REPORT-TOTAL KNEE ARTHROPLASTY   Pre-operative diagnosis- Osteoarthritis  Right knee(s)  Post-operative diagnosis- Osteoarthritis Right knee(s)  Procedure-  Right  Total Knee Arthroplasty  Surgeon- Dave RankinFrank V. Denyce Harr, MD  Assistant- Dave PedAmber Constable, PA-C   Anesthesia-  GA combined with regional for post-op pain  EBL-* No blood loss amount entered *   Drains Hemovac  Tourniquet time-  Total Tourniquet Time Documented: Thigh (Right) - 45 minutes Total: Thigh (Right) - 45 minutes     Complications- None  Condition-PACU - hemodynamically stable.   Brief Clinical Note  [name] is a 81 y.o. year old male with end stage OA of his right knee with progressively worsening pain and dysfunction. He has constant pain, with activity and at rest and significant functional deficits with difficulties even with ADLs. He has had extensive non-op management including analgesics, injections of cortisone and viscosupplements, and home exercise program, but remains in significant pain with significant dysfunction. Radiographs show bone on bone arthritis lateral and patellofemoral with valgus deformity. He presents now for right Total Knee Arthroplasty.    Procedure in detail---   The patient is brought into the operating room and positioned supine on the operating table. After successful administration of  GA combined with regional for post-op pain,   a tourniquet is placed high on the  Right thigh(s) and the lower extremity is prepped and draped in the usual sterile fashion. Time out is performed by the operating team and then the  Right lower extremity is wrapped in Esmarch, knee flexed and the tourniquet inflated to 300 mmHg.       A midline incision is made with a ten blade through the subcutaneous tissue to the level of the extensor mechanism. A fresh blade is used to make a medial parapatellar arthrotomy. Soft tissue over the proximal medial tibia is subperiosteally elevated to  the joint line with a knife and into the semimembranosus bursa with a Cobb elevator. Soft tissue over the proximal lateral tibia is elevated with attention being paid to avoiding the patellar tendon on the tibial tubercle. The patella is everted, knee flexed 90 degrees and the ACL and PCL are removed. Findings are bone on bone lateral and patellofemoral with massive global osteophytes.        The drill is used to create a starting hole in the distal femur and the canal is thoroughly irrigated with sterile saline to remove the fatty contents. The 5 degree Right  valgus alignment guide is placed into the femoral canal and the distal femoral cutting block is pinned to remove 11 mm off the distal femur to get to the lateral defect. Resection is made with an oscillating saw.      The tibia is subluxed forward and the menisci are removed. The extramedullary alignment guide is placed referencing proximally at the medial aspect of the tibial tubercle and distally along the second metatarsal axis and tibial crest. The block is pinned to remove 2mm off the more deficient lateral  side. Resection is made with an oscillating saw and it was a large resection to get to the bottom of the lateral defect.. Size 5is the most appropriate size for the tibia and the proximal tibia is prepared with the modular drill and keel punch for that size.      The femoral sizing guide is placed and size 6 is most appropriate. Rotation is marked off the epicondylar axis and confirmed by creating a rectangular flexion gap at 90 degrees. The size  6 cutting block is pinned in this rotation and the anterior, posterior and chamfer cuts are made with the oscillating saw. The intercondylar block is then placed and that cut is made.      Trial size 5 tibial component, trial size 6 posterior stabilized femur and a 17.5  mm posterior stabilized rotating platform insert trial is placed. Full extension is achieved with excellent varus/valgus and  anterior/posterior balance throughout full range of motion. The patella is everted and thickness measured to be 27  mm. Free hand resection is taken to 15 mm, a 41 template is placed, lug holes are drilled, trial patella is placed, and it tracks normally. Osteophytes are removed off the posterior femur with the trial in place. All trials are removed and the cut bone surfaces prepared with pulsatile lavage. Cement is mixed and once ready for implantation, the size 5 tibial implant, size  6 posterior stabilized femoral component, and the size 41 patella are cemented in place and the patella is held with the clamp. The trial insert is placed and the knee held in full extension. The Exparel (20 ml mixed with 60 ml saline) is injected into the extensor mechanism, posterior capsule, medial and lateral gutters and subcutaneous tissues.  All extruded cement is removed and once the cement is hard the permanent 17.5 mm posterior stabilized rotating platform insert is placed into the tibial tray.      The wound is copiously irrigated with saline solution and the extensor mechanism closed over a hemovac drain with #1 V-loc suture. The tourniquet is released for a total tourniquet time of 43  minutes. Flexion against gravity is 140 degrees and the patella tracks normally. Subcutaneous tissue is closed with 2.0 vicryl and subcuticular with running 4.0 Monocryl. The incision is cleaned and dried and steri-strips and a bulky sterile dressing are applied. The limb is placed into a knee immobilizer and the patient is awakened and transported to recovery in stable condition.      Please note that a surgical assistant was a medical necessity for this procedure in order to perform it in a safe and expeditious manner. Surgical assistant was necessary to retract the ligaments and vital neurovascular structures to prevent injury to them and also necessary for proper positioning of the limb to allow for anatomic placement of the  prosthesis.   Dave RankinFrank V. Jonerik Sliker, MD    09/25/2016, 8:27 AM

## 2016-09-25 NOTE — Interval H&P Note (Signed)
History and Physical Interval Note:  09/25/2016 6:44 AM  [name]  has presented today for surgery, with the diagnosis of Osteoarthirtis right Knee  The various methods of treatment have been discussed with the patient and family. After consideration of risks, benefits and other options for treatment, the patient has consented to  Procedure(s): RIGHT TOTAL KNEE ARTHROPLASTY (Right) as a surgical intervention .  The patient's history has been reviewed, patient examined, no change in status, stable for surgery.  I have reviewed the patient's chart and labs.  Questions were answered to the patient's satisfaction.     Loanne DrillingALUISIO,[name]

## 2016-09-25 NOTE — Evaluation (Signed)
Physical Therapy Evaluation Patient Details Name: Dave AskewBobby D [name] MRN: [id number] DOB: [birth year] Today's Date: 09/25/2016   History of Present Illness  81 yo male s/p R TKA 09/25/16. Hx of legally blind  Clinical Impression  On eval POD 0, pt required Min assist +2 safety/equipement for mobility. He walked ~20 feet with a RW. Ambulation distance limited by dizziness. Will follow and progress activity as tolerated.     Follow Up Recommendations DC plan and follow up therapy as arranged by surgeon ([facility])    Equipment Recommendations  None recommended by PT    Recommendations for Other Services       Precautions / Restrictions Precautions Precautions: Fall Required Braces or Orthoses: Knee Immobilizer - Right Knee Immobilizer - Right: Discontinue once straight leg raise with < 10 degree lag Restrictions Weight Bearing Restrictions: No RLE Weight Bearing: Weight bearing as tolerated      Mobility  Bed Mobility Overal bed mobility: Needs Assistance Bed Mobility: Supine to Sit     Supine to sit: Min assist;HOB elevated     General bed mobility comments: Assist for R LE. VCs safety, technique.   Transfers Overall transfer level: Needs assistance Equipment used: Rolling walker (2 wheeled) Transfers: Sit to/from Stand Sit to Stand: Min assist;From elevated surface         General transfer comment: Assist to rise, stabilize, control descent. VCs safety, technique, hand/LE placement   Ambulation/Gait Ambulation/Gait assistance: Min assist; +2 safety/equipment Ambulation Distance (Feet): 20 Feet Assistive device: Rolling walker (2 wheeled) Gait Pattern/deviations: Step-to pattern;Trunk flexed     General Gait Details: VCs safety, technique, sequence, posture. Assist to stabilize. Pt c/o some dizziness which limited ambulation distance.   Stairs            Wheelchair Mobility    Modified Rankin (Stroke Patients Only)       Balance                                              Pertinent Vitals/Pain Pain Assessment: 0-10 Pain Score: 6  Pain Location: R knee with activity Pain Descriptors / Indicators: Aching;Sore Pain Intervention(s): Monitored during session    Home Living Family/patient expects to be discharged to:: Private residence Living Arrangements: Alone Available Help at Discharge: Family Type of Home: House Home Access: Stairs to enter Entrance Stairs-Rails: None Entrance Stairs-Number of Steps: 1 + threshold (front) Home Layout: One level Home Equipment: Environmental consultantWalker - 2 wheels;Cane - single point      Prior Function Level of Independence: Independent with assistive device(s)         Comments: using cane for ambulation     Hand Dominance        Extremity/Trunk Assessment        Lower Extremity Assessment Lower Extremity Assessment: Generalized weakness (s/p R LE)    Cervical / Trunk Assessment Cervical / Trunk Assessment: Kyphotic (poor cervical extensor strength)  Communication   Communication: No difficulties  Cognition Arousal/Alertness: Awake/alert Behavior During Therapy: WFL for tasks assessed/performed Overall Cognitive Status: Within Functional Limits for tasks assessed                                        General Comments      Exercises     Assessment/Plan  PT Assessment Patient needs continued PT services  PT Problem List Decreased strength;Decreased mobility;Decreased range of motion;Decreased activity tolerance;Decreased balance;Decreased knowledge of use of DME;Pain;Decreased knowledge of precautions       PT Treatment Interventions DME instruction;Therapeutic activities;Gait training;Therapeutic exercise;Patient/family education;Balance training;Functional mobility training;Stair training    PT Goals (Current goals can be found in the Care Plan section)  Acute Rehab PT Goals Patient Stated Goal: less pain. regain independence PT Goal  Formulation: With patient Time For Goal Achievement: 10/09/16 Potential to Achieve Goals: Good    Frequency 7X/week   Barriers to discharge        Co-evaluation               AM-PAC PT "6 Clicks" Daily Activity  Outcome Measure Difficulty turning over in bed (including adjusting bedclothes, sheets and blankets)?: Total Difficulty moving from lying on back to sitting on the side of the bed? : Total Difficulty sitting down on and standing up from a chair with arms (e.g., wheelchair, bedside commode, etc,.)?: Total Help needed moving to and from a bed to chair (including a wheelchair)?: A Little Help needed walking in hospital room?: A Little Help needed climbing 3-5 steps with a railing? : A Little 6 Click Score: 12    End of Session Equipment Utilized During Treatment: Gait belt;Right knee immobilizer Activity Tolerance: Other (comment) (limited by dizziness) Patient left: in chair;with call bell/phone within reach   PT Visit Diagnosis: Muscle weakness (generalized) (M62.81);Difficulty in walking, not elsewhere classified (R26.2)    Time: [id number] PT Time Calculation (min) (ACUTE ONLY): 18 min   Charges:   PT Evaluation $PT Eval Low Complexity: 1 Low     PT G Codes:          Rebeca AlertJannie Eino Whitner, MPT Pager: [phone]

## 2016-09-25 NOTE — Anesthesia Postprocedure Evaluation (Signed)
Anesthesia Post Note  Patient: [name]  Procedure(s) Performed: Procedure(s) (LRB): RIGHT TOTAL KNEE ARTHROPLASTY (Right)     Patient location during evaluation: PACU Anesthesia Type: General and Regional Level of consciousness: awake and alert Pain management: pain level controlled Vital Signs Assessment: post-procedure vital signs reviewed and stable Respiratory status: spontaneous breathing, nonlabored ventilation, respiratory function stable and patient connected to nasal cannula oxygen Cardiovascular status: blood pressure returned to baseline and stable Postop Assessment: no signs of nausea or vomiting Anesthetic complications: no    Last Vitals:  Vitals:   09/25/16 0954 09/25/16 1009  BP: (!) 159/83 (!) 157/80  Pulse: 60   Resp: 17 16  Temp:  37.2 C    Last Pain:  Vitals:   09/25/16 0930  TempSrc:   PainSc: Asleep    LLE Motor Response: Responds to commands (09/25/16 1000) LLE Sensation: No numbness;No tingling (09/25/16 1000) RLE Motor Response: Responds to commands (09/25/16 1000) RLE Sensation: No numbness;No tingling (09/25/16 1000)      Phillips Groutarignan, Noreene Boreman

## 2016-09-25 NOTE — Transfer of Care (Signed)
Immediate Anesthesia Transfer of Care Note  Patient: Dave AskewBobby D [name]  Procedure(s) Performed: Procedure(s): RIGHT TOTAL KNEE ARTHROPLASTY (Right)  Patient Location: PACU  Anesthesia Type:General  Level of Consciousness: awake, oriented, patient cooperative, lethargic and responds to stimulation  Airway & Oxygen Therapy: Patient Spontanous Breathing and Patient connected to nasal cannula oxygen  Post-op Assessment: Report given to RN and Post -op Vital signs reviewed and stable  Post vital signs: Reviewed and stable  Last Vitals:  Vitals:   09/25/16 0513  BP: (!) 159/71  Pulse: (!) 59  Resp: 18  Temp: 36.9 C    Last Pain:  Vitals:   09/25/16 0513  TempSrc: Oral      Patients Stated Pain Goal: 4 (09/25/16 0530)  Complications: No apparent anesthesia complications

## 2016-09-25 NOTE — Progress Notes (Signed)
Dr Acey Lavarignan aware of BP.  Order received to give hydralazine 5 mg x 1 dose.

## 2016-09-25 NOTE — Anesthesia Procedure Notes (Addendum)
Anesthesia Regional Block: Adductor canal block   Pre-Anesthetic Checklist: ,, timeout performed, Correct Patient, Correct Site, Correct Laterality, Correct Procedure, Correct Position, site marked, Risks and benefits discussed,  Surgical consent,  Pre-op evaluation,  At surgeon's request and post-op pain management  Laterality: Lower and Right  Prep: Maximum Sterile Barrier Precautions used, chloraprep       Needles:  Injection technique: Single-shot  Needle Type: Echogenic Stimulator Needle     Needle Length: 10cm      Additional Needles:   Procedures: ultrasound guided,,,,,,,,  Narrative:  Start time: 09/25/2016 7:09 AM End time: 09/25/2016 7:19 AM Injection made incrementally with aspirations every 5 mL.  Performed by: Personally  Anesthesiologist: Phillips GroutARIGNAN, Shaquila Sigman  Additional Notes: Risks, benefits and alternative to block explained extensively.  Patient tolerated procedure well, without complications.

## 2016-09-25 NOTE — H&P (View-Only) (Signed)
Dave AskewBobby D [name] DOB: 1935/03/02 Widowed / Language: [place] / Race: Black or African American Male Date of Admission  09/25/2016 CC:  Right Knee Pain History of Present Illness The patient is a 81 year old male who comes in for a preoperative History and Physical. The patient is scheduled for a right total knee arthroplasty to be performed by Dr. Gus RankinFrank V. Aluisio, [facility] 09/25/[facility]. The patient is a 81 year old male who presented for follow up of their knee. The patient is being followed for their right knee pain and osteoarthritis. Symptoms reported include: pain, swelling, aching, instability and difficulty ambulating. The patient feels that they are doing poorly and report their pain level to be mild to moderate. The following medication has been used for pain control: none. His right knee has gotten progressively worse over time. Injections somewhat were beneficial. He has had a progressively worsening valgus deformity. He has a similar problem with the left knee and responded great to his total knee arthroplasty. AP and lateral of that right knee show bone-on-bone arthritis in all three compartments but far worse laterally with that real significant valgus deformity of the right knee. He has advanced end-stage arthritis of the right knee with progressive pain and dysfunction. At this point, the most predictable means of improving pain and function is total knee arthroplasty. The procedure, risks, potential complications and rehab course are discussed in detail and the patient elects to proceed. They have been treated conservatively in the past for the above stated problem and despite conservative measures, they continue to have progressive pain and severe functional limitations and dysfunction. They have failed non-operative management including home exercise, medications. It is felt that they would benefit from undergoing total joint replacement. Risks and benefits of the  procedure have been discussed with the patient and they elect to proceed with surgery. There are no active contraindications to surgery such as ongoing infection or rapidly progressive neurological disease.   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)  Lumbar pain (M54.5)  Shoulder impingement syndrome (M75.40)  Primary osteoarthritis of right knee (M17.11)  Olecranon bursitis, right elbow (M70.21)  Right elbow pain (M25.521)  Cataract  Macular Degeneration  Dentures  Tinnitus  Impaired Vision  Right Eye Glaucoma  Osteoarthritis  High blood pressure  Cervical spinal stenosis (M48.02)  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  Pseudophakia Bilateral Eyes  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  Bullous Keratopathy Right Eye   Allergies Flomax *GENITOURINARY AGENTS - MISCELLANEOUS*  blurred vision Dye FDC Red 40 (Carmine Red) *PHARMACEUTICAL ADJUVANTS*  CeleBREX *ANALGESICS - ANTI-INFLAMMATORY*  causes hypertension Bextra *ANALGESICS - ANTI-INFLAMMATORY*  causes hypertension Hyoscamine *ULCER DRUGS*  causes patient to pass out ChlorproMAZINE *ANTIPSYCHOTICS/ANTIMANIC AGENTS*  dizziness Cipro *FLUOROQUINOLONES*  Imuran *Miscellaneous Therapeutic Classes**  Methotrexate (Arthritis) *ANALGESICS - ANTI-INFLAMMATORY*  anemia Penicillin VK *PENICILLINS*  GI Problems  Family History  Bleeding disorder  brother Osteoarthritis  mother Hypertension  mother, sister and  brother sister and brother Cerebrovascular Accident  mother Cancer  First Degree Relatives. sister and brother Diabetes Mellitus  brother  Social History Post-Surgical Plans  Plan is to go home following the surgery. Current work status  retired Children  3 Alcohol use  never consumed alcohol 03-02-14 Drug/Alcohol Rehab (Currently)  no Tobacco use  Never smoker. never smoker 03-02-14 Tobacco / smoke exposure  no yes outdoors only 03-02-14 Illicit drug use  no Exercise  Exercises daily; does other Drug/Alcohol Rehab (Previously)  no Pain Contract  no Marital status  widowed Living situation  live alone  Medication History  Acular LS (0.4% Solution, Ophthalmic) Active. AmLODIPine Besylate (5MG  Tablet, Oral) Active. Aspirin EC (81MG  Tablet DR, Oral) Active. Atenolol (50MG  Tablet, 1/2 Oral daily) Active. Brimonidine Tartrate (0.2% Solution, one drop Ophthalmic each eye daily) Active. Cetirizine HCl (10MG  Tablet, Oral daily) Active. Cholecalciferol (2000UNIT Tablet, Oral daily) Active. Cyclogyl (1% Solution, one drop Ophthalmic right eye twice a day) Active. Dorzolamide HCl (2% Solution, One drop Ophthalmic Both eyes twice a day) Active. Dorzolamide HCl-Timolol Mal (22.3-6.8MG /ML Solution, One Drop Ophthalmic Both eyes twice a day) Active. Doxazosin Mesylate (8MG  Tablet, Oral daily) Active. Finasteride (5MG  Tablet, Oral daily) Active. Losartan Potassium-HCTZ (100-25MG  Tablet, Oral daily) Active. Saw Palmetto Extract (Oral) Specific strength unknown - Active. Timolol Maleate (Ophthalmic) Specific strength unknown - Active. BAC+ Active. (supplement for artery health) EVERY MAN 2 MVI Active. EYE BRIGHT Active. JOINT AID Active. XANGO JUICE Active.  Past Surgical History Eye Surgery  Total Knee Replacement  left Spinal Surgery  Cataract Surgery  bilateral    Review of Systems General Not Present- Chills, Fatigue, Fever, Memory Loss, Night  Sweats, Weight Gain and Weight Loss. Skin Not Present- Eczema, Hives, Itching, Lesions and Rash. HEENT Not Present- Dentures, Double Vision, Headache, Hearing Loss, Tinnitus and Visual Loss. Respiratory Not Present- Allergies, Chronic Cough, Coughing up blood, Shortness of breath at rest and Shortness of breath with exertion. Cardiovascular Not Present- Chest Pain, Difficulty Breathing Lying Down, Murmur, Palpitations, Racing/skipping heartbeats and Swelling. Gastrointestinal Not Present- Abdominal Pain, Bloody Stool, Constipation, Diarrhea, Difficulty Swallowing, Heartburn, Jaundice, Loss of appetitie, Nausea and Vomiting. Male Genitourinary Not Present- Blood in Urine, Discharge, Flank Pain, Incontinence, Painful Urination, Urgency, Urinary frequency, Urinary Retention, Urinating at Night and Weak urinary stream. Musculoskeletal Present- Joint Pain and Morning Stiffness. Not Present- Back Pain, Joint Swelling, Muscle Pain, Muscle Weakness and Spasms. Neurological Not Present- Blackout spells, Difficulty with balance, Dizziness, Paralysis, Tremor and Weakness. Psychiatric Not Present- Insomnia.  Vitals Weight: 217 lb Height: 75in Weight was reported by patient. Height was reported by patient. Body Surface Area: 2.27 m Body Mass Index: 27.12 kg/m  Pulse: 56 (Regular)  Resp.: 14 (Unlabored)  BP: 132/56 (Sitting, Left Arm, Standard)       Physical Exam  General Mental Status -Alert, cooperative and good historian. General Appearance-pleasant, Not in acute distress. Orientation-Oriented X3. Build & Nutrition-Well nourished and Well developed.  Head and Neck Head-normocephalic, atraumatic . Neck Global Assessment - supple, no bruit auscultated on the right, no bruit auscultated on the left.  Eye Vision-Decreased(right eye) and Wears contact lenses(left eye). Upper Eyelid - Right -Note: ptosis right eye.  Motion - Bilateral-EOMI.  Chest and Lung  Exam Auscultation Breath sounds - clear at anterior chest wall and clear at posterior chest wall. Adventitious sounds - No Adventitious sounds.  Cardiovascular Auscultation Rhythm - Regular rate and rhythm. Heart Sounds - S1 WNL and S2 WNL. Murmurs & Other Heart Sounds - Auscultation of the heart reveals - No Murmurs.  Abdomen Palpation/Percussion Tenderness - Abdomen is non-tender to palpation. Rigidity (guarding) - Abdomen is soft. Auscultation Auscultation of the  abdomen reveals - Bowel sounds normal.  Male Genitourinary Note: Not done, not pertinent to present illness   Musculoskeletal Note: His right knee shows no effusion. He has a tremendous valgus deformity of over 25 degrees. His range of motion is about 5 to 125. He is tender lateral greater than medial with no instability noted.  RADIOGRAPHS I reviewed his radiographs. AP and lateral of that right knee show bone-on-bone arthritis in all three compartments but far worse laterally with that real significant valgus deformity of the right knee.   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 Lynann Bologna Cards: Dr. Sharyn Lull  IV TXA  Anesthesia Issues: None but slow to wake up  Patient was instructed on what medications to stop prior to surgery.  Signed electronically by Lauraine Rinne, III PA-C

## 2016-09-25 NOTE — Discharge Instructions (Addendum)
°  ° °Dr. Frank Aluisio °Total Joint Specialist °[facility] °[address] °[place], [place] [address] °[phone] ° °TOTAL KNEE REPLACEMENT POSTOPERATIVE DIRECTIONS ° °Knee Rehabilitation, Guidelines Following Surgery  °Results after knee surgery are often greatly improved when you follow the exercise, range of motion and muscle strengthening exercises prescribed by your doctor. Safety measures are also important to protect the knee from further injury. Any time any of these exercises cause you to have increased pain or swelling in your knee joint, decrease the amount until you are comfortable again and slowly increase them. If you have problems or questions, call your caregiver or physical therapist for advice.  ° °HOME CARE INSTRUCTIONS  °Remove items at home which could result in a fall. This includes throw rugs or furniture in walking pathways.  °· ICE to the affected knee every three hours for 30 minutes at a time and then as needed for pain and swelling.  Continue to use ice on the knee for pain and swelling from surgery. You may notice swelling that will progress down to the foot and ankle.  This is normal after surgery.  Elevate the leg when you are not up walking on it.   °· Continue to use the breathing machine which will help keep your temperature down.  It is common for your temperature to cycle up and down following surgery, especially at night when you are not up moving around and exerting yourself.  The breathing machine keeps your lungs expanded and your temperature down. °· Do not place pillow under knee, focus on keeping the knee straight while resting ° °DIET °You may resume your previous home diet once your are discharged from the hospital. ° °DRESSING / WOUND CARE / SHOWERING °You may shower 3 days after surgery, but keep the wounds dry during showering.  You may use an occlusive plastic wrap (Press'n Seal for example), NO SOAKING/SUBMERGING IN THE BATHTUB.  If the  bandage gets wet, change with a clean dry gauze.  If the incision gets wet, pat the wound dry with a clean towel. °You may start showering once you are discharged home but do not submerge the incision under water. Just pat the incision dry and apply a dry gauze dressing on daily. °Change the surgical dressing daily and reapply a dry dressing each time. ° °ACTIVITY °Walk with your walker as instructed. °Use walker as long as suggested by your caregivers. °Avoid periods of inactivity such as sitting longer than an hour when not asleep. This helps prevent blood clots.  °You may resume a sexual relationship in one month or when given the OK by your doctor.  °You may return to work once you are cleared by your doctor.  °Do not drive a car for 6 weeks or until released by you surgeon.  °Do not drive while taking narcotics. ° °WEIGHT BEARING °Weight bearing as tolerated with assist device (walker, cane, etc) as directed, use it as long as suggested by your surgeon or therapist, typically at least 4-6 weeks. ° °POSTOPERATIVE CONSTIPATION PROTOCOL °Constipation - defined medically as fewer than three stools per week and severe constipation as less than one stool per week. ° °One of the most common issues patients have following surgery is constipation.  Even if you have a regular bowel pattern at home, your normal regimen is likely to be disrupted due to multiple reasons following surgery.  Combination of anesthesia, postoperative narcotics, change in appetite and fluid intake all can affect your   bowels.  In order to avoid complications following surgery, here are some recommendations in order to help you during your recovery period. ° °Colace (docusate) - Pick up an over-the-counter form of Colace or another stool softener and take twice a day as long as you are requiring postoperative pain medications.  Take with a full glass of water daily.  If you experience loose stools or diarrhea, hold the colace until you stool forms  back up.  If your symptoms do not get better within 1 week or if they get worse, check with your doctor. ° °Dulcolax (bisacodyl) - Pick up over-the-counter and take as directed by the product packaging as needed to assist with the movement of your bowels.  Take with a full glass of water.  Use this product as needed if not relieved by Colace only.  ° °MiraLax (polyethylene glycol) - Pick up over-the-counter to have on hand.  MiraLax is a solution that will increase the amount of water in your bowels to assist with bowel movements.  Take as directed and can mix with a glass of water, juice, soda, coffee, or tea.  Take if you go more than two days without a movement. °Do not use MiraLax more than once per day. Call your doctor if you are still constipated or irregular after using this medication for 7 days in a row. ° °If you continue to have problems with postoperative constipation, please contact the office for further assistance and recommendations.  If you experience "the worst abdominal pain ever" or develop nausea or vomiting, please contact the office immediatly for further recommendations for treatment. ° °ITCHING ° If you experience itching with your medications, try taking only a single pain pill, or even half a pain pill at a time.  You can also use Benadryl over the counter for itching or also to help with sleep.  ° °TED HOSE STOCKINGS °Wear the elastic stockings on both legs for three weeks following surgery during the day but you may remove then at night for sleeping. ° °MEDICATIONS °See your medication summary on the “After Visit Summary” that the nursing staff will review with you prior to discharge.  You may have some home medications which will be placed on hold until you complete the course of blood thinner medication.  It is important for you to complete the blood thinner medication as prescribed by your surgeon.  Continue your approved medications as instructed at time of  discharge. ° °PRECAUTIONS °If you experience chest pain or shortness of breath - call 911 immediately for transfer to the hospital emergency department.  °If you develop a fever greater that 101 F, purulent drainage from wound, increased redness or drainage from wound, foul odor from the wound/dressing, or calf pain - CONTACT YOUR SURGEON.   °                                                °FOLLOW-UP APPOINTMENTS °Make sure you keep all of your appointments after your operation with your surgeon and caregivers. You should call the office at the above phone number and make an appointment for approximately two weeks after the date of your surgery or on the date instructed by your surgeon outlined in the "After Visit Summary". ° ° °RANGE OF MOTION AND STRENGTHENING EXERCISES  °Rehabilitation of the knee is important following a knee   injury or an operation. After just a few days of immobilization, the muscles of the thigh which control the knee become weakened and shrink (atrophy). Knee exercises are designed to build up the tone and strength of the thigh muscles and to improve knee motion. Often times heat used for twenty to thirty minutes before working out will loosen up your tissues and help with improving the range of motion but do not use heat for the first two weeks following surgery. These exercises can be done on a training (exercise) mat, on the floor, on a table or on a bed. Use what ever works the best and is most comfortable for you Knee exercises include:  °Leg Lifts - While your knee is still immobilized in a splint or cast, you can do straight leg raises. Lift the leg to 60 degrees, hold for 3 sec, and slowly lower the leg. Repeat 10-20 times 2-3 times daily. Perform this exercise against resistance later as your knee gets better.  °Quad and Hamstring Sets - Tighten up the muscle on the front of the thigh (Quad) and hold for 5-10 sec. Repeat this 10-20 times hourly. Hamstring sets are done by pushing the  foot backward against an object and holding for 5-10 sec. Repeat as with quad sets.  °· Leg Slides: Lying on your back, slowly slide your foot toward your buttocks, bending your knee up off the floor (only go as far as is comfortable). Then slowly slide your foot back down until your leg is flat on the floor again. °· Angel Wings: Lying on your back spread your legs to the side as far apart as you can without causing discomfort.  °A rehabilitation program following serious knee injuries can speed recovery and prevent re-injury in the future due to weakened muscles. Contact your doctor or a physical therapist for more information on knee rehabilitation.  ° °IF YOU ARE TRANSFERRED TO A SKILLED REHAB FACILITY °If the patient is transferred to a skilled rehab facility following release from the hospital, a list of the current medications will be sent to the facility for the patient to continue.  When discharged from the skilled rehab facility, please have the facility set up the patient's Home Health Physical Therapy prior to being released. Also, the skilled facility will be responsible for providing the patient with their medications at time of release from the facility to include their pain medication, the muscle relaxants, and their blood thinner medication. If the patient is still at the rehab facility at time of the two week follow up appointment, the skilled rehab facility will also need to assist the patient in arranging follow up appointment in our office and any transportation needs. ° °MAKE SURE YOU:  °Understand these instructions.  °Get help right away if you are not doing well or get worse.  ° ° °Pick up stool softner and laxative for home use following surgery while on pain medications. °Do not submerge incision under water. °Please use good hand washing techniques while changing dressing each day. °May shower starting three days after surgery. °Please use a clean towel to pat the incision dry following  showers. °Continue to use ice for pain and swelling after surgery. °Do not use any lotions or creams on the incision until instructed by your surgeon. ° °Take Xarelto for two and a half more weeks following discharge from the hospital, then discontinue Xarelto. °Once the patient has completed the blood thinner regimen, then take a Baby 81 mg Aspirin daily   for three more weeks. ° ° °Information on my medicine - XARELTO® (Rivaroxaban) ° ° °Why was Xarelto® prescribed for you? °Xarelto® was prescribed for you to reduce the risk of blood clots forming after orthopedic surgery. The medical term for these abnormal blood clots is venous thromboembolism (VTE). ° °What do you need to know about xarelto® ? °Take your Xarelto® ONCE DAILY at the same time every day. °You may take it either with or without food. ° °If you have difficulty swallowing the tablet whole, you may crush it and mix in applesauce just prior to taking your dose. ° °Take Xarelto® exactly as prescribed by your doctor and DO NOT stop taking Xarelto® without talking to the doctor who prescribed the medication.  Stopping without other VTE prevention medication to take the place of Xarelto® may increase your risk of developing a clot. ° °After discharge, you should have regular check-up appointments with your healthcare provider that is prescribing your Xarelto®.   ° °What do you do if you miss a dose? °If you miss a dose, take it as soon as you remember on the same day then continue your regularly scheduled once daily regimen the next day. Do not take two doses of Xarelto® on the same day.  ° °Important Safety Information °A possible side effect of Xarelto® is bleeding. You should call your healthcare provider right away if you experience any of the following: °? Bleeding from an injury or your nose that does not stop. °? Unusual colored urine (red or dark brown) or unusual colored stools (red or black). °? Unusual bruising for unknown reasons. °? A serious  fall or if you hit your head (even if there is no bleeding). ° °Some medicines may interact with Xarelto® and might increase your risk of bleeding while on Xarelto®. To help avoid this, consult your healthcare provider or pharmacist prior to using any new prescription or non-prescription medications, including herbals, vitamins, non-steroidal anti-inflammatory drugs (NSAIDs) and supplements. ° °This website has more information on Xarelto®: www.xarelto.com. ° ° °

## 2016-09-25 NOTE — Anesthesia Procedure Notes (Signed)
Procedure Name: LMA Insertion Date/Time: 09/25/2016 7:24 AM Performed by: Phillips GroutARIGNAN, PETER Pre-anesthesia Checklist: Patient identified, Emergency Drugs available, Suction available, Patient being monitored and Timeout performed Patient Re-evaluated:Patient Re-evaluated prior to induction Oxygen Delivery Method: Circle system utilized Preoxygenation: Pre-oxygenation with 100% oxygen Induction Type: IV induction LMA: LMA inserted LMA Size: 4.0 Number of attempts: 1 Placement Confirmation: positive ETCO2 Tube secured with: Tape Dental Injury: Teeth and Oropharynx as per pre-operative assessment

## 2016-09-26 ENCOUNTER — Encounter (HOSPITAL_COMMUNITY): Payer: Self-pay | Admitting: Orthopedic Surgery

## 2016-09-26 LAB — BASIC METABOLIC PANEL
Anion gap: 8 (ref 5–15)
BUN: 15 mg/dL (ref 6–20)
CHLORIDE: 104 mmol/L (ref 101–111)
CO2: 26 mmol/L (ref 22–32)
Calcium: 8.7 mg/dL — ABNORMAL LOW (ref 8.9–10.3)
Creatinine, Ser: 1.13 mg/dL (ref 0.61–1.24)
GFR calc non Af Amer: 60 mL/min — ABNORMAL LOW (ref >60)
Glucose, Bld: 140 mg/dL — ABNORMAL HIGH (ref 65–99)
POTASSIUM: 4.1 mmol/L (ref 3.5–5.1)
SODIUM: 138 mmol/L (ref 135–145)

## 2016-09-26 LAB — CBC
HEMATOCRIT: 32.3 % — AB (ref 39.0–52.0)
HEMOGLOBIN: 11.8 g/dL — AB (ref 13.0–17.0)
MCH: 31.5 pg (ref 26.0–34.0)
MCHC: 36.5 g/dL — ABNORMAL HIGH (ref 30.0–36.0)
MCV: 86.1 fL (ref 78.0–100.0)
Platelets: 128 10*3/uL — ABNORMAL LOW (ref 150–400)
RBC: 3.75 MIL/uL — AB (ref 4.22–5.81)
RDW: 13.7 % (ref 11.5–15.5)
WBC: 9.6 10*3/uL (ref 4.0–10.5)

## 2016-09-26 MED ORDER — LORAZEPAM 2 MG/ML IJ SOLN
0.5000 mg | Freq: Four times a day (QID) | INTRAMUSCULAR | Status: DC | PRN
  Administered 2016-09-26: 1 mg via INTRAVENOUS
  Filled 2016-09-26: qty 1

## 2016-09-26 NOTE — Progress Notes (Signed)
   Subjective: 1 Day Post-Op Procedure(s) (LRB): RIGHT TOTAL KNEE ARTHROPLASTY (Right) Patient reports pain as mild.   Patient seen in rounds for Dr. Lequita HaltAluisio. Patient is well, but has had some minor complaints of pain in the knee, requiring pain medications We will resume therapy today.  He walked about 20 feet the day of surgery. Plan is to go Home after hospital stay.  Objective: Vital signs in last 24 hours: Temp:  [97.3 F (36.3 C)-98.9 F (37.2 C)] 98.7 F (37.1 C) (07/31 0600) Pulse Rate:  [54-68] 58 (07/31 0600) Resp:  [13-21] 16 (07/31 0600) BP: (134-173)/(56-93) 134/56 (07/31 0600) SpO2:  [95 %-100 %] 99 % (07/31 0600)  Intake/Output from previous day:  Intake/Output Summary (Last 24 hours) at 09/26/16 0813 Last data filed at 09/26/16 0700  Gross per 24 hour  Intake          3621.67 ml  Output             1655 ml  Net          1966.67 ml    Intake/Output this shift: No intake/output data recorded.  Labs:  Recent Labs  09/26/16 0557  HGB 11.8*    Recent Labs  09/26/16 0557  WBC 9.6  RBC 3.75*  HCT 32.3*  PLT 128*    Recent Labs  09/26/16 0557  NA 138  K 4.1  CL 104  CO2 26  BUN 15  CREATININE 1.13  GLUCOSE 140*  CALCIUM 8.7*   No results for input(s): LABPT, INR in the last 72 hours.  EXAM General - Patient is Alert and Appropriate Extremity - Neurovascular intact Sensation intact distally Intact pulses distally Dorsiflexion/Plantar flexion intact Dressing - dressing C/D/I Motor Function - intact, moving foot and toes well on exam.  Hemovac pulled without difficulty.  Past Medical History:  Diagnosis Date  . Anemia   . Arthritis   . Complication of anesthesia    HARD TO WAKE UP  . Constipation   . DJD (degenerative joint disease)   . Glaucoma   . Headache    hx of  . Heart murmur   . History of kidney stones   . HTN (hypertension)   . Otitis media    with intermittent vertigo  . Pneumonia   . PONV (postoperative nausea  and vomiting)   . Weakness    of neck muscles    Assessment/Plan: 1 Day Post-Op Procedure(s) (LRB): RIGHT TOTAL KNEE ARTHROPLASTY (Right) Active Problems:   OA (osteoarthritis) of knee  Estimated body mass index is 26.87 kg/m as calculated from the following:   Height as of this encounter: 6\' 3"  (1.905 m).   Weight as of this encounter: 97.5 kg (215 lb). Up with therapy Discharge home - No HHPT - In-Home [facility]  DVT Prophylaxis - Xarelto Weight-Bearing as tolerated to right leg D/C O2 and Pulse OX and try on Room Air  Avel Peacerew Lygia Olaes, PA-C Orthopaedic Surgery 09/26/2016, 8:13 AM

## 2016-09-26 NOTE — Progress Notes (Signed)
Spoke with Michaelene Songrew Perkins,PA due to patient confusion, agitation, screaming, swinging at staff, stating he was going to hit staff repeatedly, unable to calm patient. Patient continuously jumping out of bed without walker or assistance.unable to get patient Patient was physically grabbing staff. Took myself and the assistance of 4 staff to guide patient back to bed, when at that time drew perkins, pa was called and 1mg  of ativan given. Patient resting in bed at this time, with son and safety sitter at bedside.

## 2016-09-26 NOTE — Addendum Note (Signed)
Addendum  created 09/26/16 0645 by Elyn PeersAllen, Pascal Stiggers J, CRNA   Anesthesia Event edited, Charge Capture section accepted

## 2016-09-26 NOTE — Progress Notes (Signed)
Physical Therapy Treatment Patient Details Name: Dave AskewBobby D Florence MRN: [id number] DOB: 05-17-1935 Today's Date: 09/26/2016    History of Present Illness 81 yo male s/p R TKA 09/25/16. Hx of legally blind    PT Comments    POD # 1 am session Applied KI and instructed on use.  Assisted OOB to amb an increased distance.  Returned to room to perform some TKR TE's followed by ICE.    Follow Up Recommendations  DC plan and follow up therapy as arranged by surgeon ([facility])     Equipment Recommendations  None recommended by PT    Recommendations for Other Services       Precautions / Restrictions Precautions Precautions: Fall Precaution Comments: instructed on KI use for amb and stairs.  Pt has low vision Required Braces or Orthoses: Knee Immobilizer - Right Knee Immobilizer - Right: Discontinue once straight leg raise with < 10 degree lag Restrictions Weight Bearing Restrictions: No RLE Weight Bearing: Weight bearing as tolerated    Mobility  Bed Mobility Overal bed mobility: Needs Assistance Bed Mobility: Supine to Sit     Supine to sit: Min guard;Min assist     General bed mobility comments: assist for R LE and increased time to scoot to EOB  Transfers Overall transfer level: Needs assistance Equipment used: Rolling walker (2 wheeled) Transfers: Sit to/from Stand Sit to Stand: Min assist;Min guard         General transfer comment: 50% VC's on proper hand placement and increased time to rise (pt very tall)  Ambulation/Gait Ambulation/Gait assistance: Min guard;Min assist Ambulation Distance (Feet): 35 Feet Assistive device: Rolling walker (2 wheeled) Gait Pattern/deviations: Step-to pattern;Trunk flexed Gait velocity: decreased   General Gait Details: 50% VC's on proper walker to self distance and upright posture.  Hx cervical ROM limitation positioned in forward flextion.     Stairs            Wheelchair Mobility    Modified Rankin (Stroke Patients  Only)       Balance                                            Cognition Arousal/Alertness: Awake/alert Behavior During Therapy: WFL for tasks assessed/performed Overall Cognitive Status: Within Functional Limits for tasks assessed                                        Exercises   Total Knee Replacement TE's 10 reps B LE ankle pumps 10 reps towel squeezes 10 reps knee presses Followed by ICE    General Comments        Pertinent Vitals/Pain Pain Assessment: 0-10 Pain Score: 2  Pain Location: R knee with activity Pain Descriptors / Indicators: Aching;Sore;Operative site guarding Pain Intervention(s): Monitored during session;Premedicated before session;Repositioned;Ice applied    Home Living Family/patient expects to be discharged to:: Private residence Living Arrangements: Alone Available Help at Discharge: Family Type of Home: House Home Access: Stairs to enter Entrance Stairs-Rails: None Home Layout: One level Home Equipment: Environmental consultantWalker - 2 wheels;Cane - single point      Prior Function Level of Independence: Independent with assistive device(s)      Comments: using cane for ambulation   PT Goals (current goals can now be found in the care plan  section) Acute Rehab PT Goals Patient Stated Goal: less pain. regain independence Progress towards PT goals: Progressing toward goals    Frequency    7X/week      PT Plan Current plan remains appropriate    Co-evaluation              AM-PAC PT "6 Clicks" Daily Activity  Outcome Measure  Difficulty turning over in bed (including adjusting bedclothes, sheets and blankets)?: Total Difficulty moving from lying on back to sitting on the side of the bed? : Total Difficulty sitting down on and standing up from a chair with arms (e.g., wheelchair, bedside commode, etc,.)?: Total Help needed moving to and from a bed to chair (including a wheelchair)?: A Lot Help needed  walking in hospital room?: A Lot Help needed climbing 3-5 steps with a railing? : A Lot 6 Click Score: 9    End of Session Equipment Utilized During Treatment: Gait belt;Right knee immobilizer Activity Tolerance: Patient tolerated treatment well Patient left: in chair;with call bell/phone within reach;with family/visitor present;with chair alarm set Nurse Communication: Mobility status PT Visit Diagnosis: Muscle weakness (generalized) (M62.81);Difficulty in walking, not elsewhere classified (R26.2)     Time: [id number] PT Time Calculation (min) (ACUTE ONLY): 25 min  Charges:  $Gait Training: 8-22 mins $Therapeutic Exercise: 8-22 mins                    G Codes:        {Gilma Bessette  PTA [facility]  Acute  Rehab Pager      [phone]

## 2016-09-26 NOTE — Progress Notes (Signed)
Spoke with patient and daughter at bedside. Plan is for d/c home with [facility], set up has been done. Daughter will assist at home. Patient has a RW, needs a 3n1. Contacted [facility] to deliver to the room. [phone]

## 2016-09-26 NOTE — Addendum Note (Signed)
Addendum  created 09/26/16 0646 by Elyn PeersAllen, Herma Uballe J, CRNA   Charge Capture section accepted

## 2016-09-26 NOTE — Progress Notes (Signed)
Physical Therapy Treatment Patient Details Name: Dave AskewBobby D [name] MRN: [id number] DOB: 03-25-1935 Today's Date: 09/26/2016    History of Present Illness 81 yo male s/p R TKA 09/25/16. Hx of legally blind    PT Comments    POD # 1 pm session Assisted with amb a greater distance then returned to bed to complete TKR TE's followed by ICE. Pt progressing well and plans to D/C to home with daughter tomorrow.   Follow Up Recommendations  DC plan and follow up therapy as arranged by surgeon ([facility])     Equipment Recommendations  None recommended by PT    Recommendations for Other Services       Precautions / Restrictions Precautions Precautions: Fall Precaution Comments: instructed on KI use for amb and stairs.  Pt has low vision Required Braces or Orthoses: Knee Immobilizer - Right Knee Immobilizer - Right: Discontinue once straight leg raise with < 10 degree lag Restrictions Weight Bearing Restrictions: No RLE Weight Bearing: Weight bearing as tolerated    Mobility  Bed Mobility Overal bed mobility: Needs Assistance Bed Mobility: Sit to Supine     Supine to sit: Min guard;Min assist Sit to supine: Min assist   General bed mobility comments: assist for R LE and increased time back to bed  Transfers Overall transfer level: Needs assistance Equipment used: Rolling walker (2 wheeled) Transfers: Sit to/from Stand Sit to Stand: Min assist;Min guard         General transfer comment: 50% VC's on proper hand placement and increased time to rise (pt very tall)  Ambulation/Gait Ambulation/Gait assistance: Min guard;Min assist Ambulation Distance (Feet): 45 Feet Assistive device: Rolling walker (2 wheeled) Gait Pattern/deviations: Step-to pattern;Trunk flexed Gait velocity: decreased   General Gait Details: 50% VC's on proper walker to self distance and upright posture.  Hx cervical ROM limitation positioned in forward flextion.     Stairs            Wheelchair  Mobility    Modified Rankin (Stroke Patients Only)       Balance                                            Cognition Arousal/Alertness: Awake/alert Behavior During Therapy: WFL for tasks assessed/performed Overall Cognitive Status: Within Functional Limits for tasks assessed                                        Exercises   Total Knee Replacement TE's 10 reps B LE ankle pumps 10 reps towel squeezes 10 reps knee presses 10 reps heel slides  10 reps SLR's 10 reps ABD Followed by ICE     General Comments        Pertinent Vitals/Pain Pain Assessment: 0-10 Pain Score: 2  Pain Location: R knee with activity Pain Descriptors / Indicators: Aching;Sore;Operative site guarding Pain Intervention(s): Monitored during session;Premedicated before session;Repositioned;Ice applied    Home Living Family/patient expects to be discharged to:: Private residence Living Arrangements: Alone Available Help at Discharge: Family Type of Home: House Home Access: Stairs to enter Entrance Stairs-Rails: None Home Layout: One level Home Equipment: Environmental consultantWalker - 2 wheels;Cane - single point      Prior Function Level of Independence: Independent with assistive device(s)      Comments: using cane  for ambulation   PT Goals (current goals can now be found in the care plan section) Acute Rehab PT Goals Patient Stated Goal: less pain. regain independence Progress towards PT goals: Progressing toward goals    Frequency    7X/week      PT Plan Current plan remains appropriate    Co-evaluation              AM-PAC PT "6 Clicks" Daily Activity  Outcome Measure  Difficulty turning over in bed (including adjusting bedclothes, sheets and blankets)?: Total Difficulty moving from lying on back to sitting on the side of the bed? : Total Difficulty sitting down on and standing up from a chair with arms (e.g., wheelchair, bedside commode, etc,.)?:  Total Help needed moving to and from a bed to chair (including a wheelchair)?: A Lot Help needed walking in hospital room?: A Lot Help needed climbing 3-5 steps with a railing? : A Lot 6 Click Score: 9    End of Session Equipment Utilized During Treatment: Gait belt;Right knee immobilizer Activity Tolerance: Patient tolerated treatment well Patient left: in chair;with call bell/phone within reach;with family/visitor present;with chair alarm set Nurse Communication: Mobility status PT Visit Diagnosis: Muscle weakness (generalized) (M62.81);Difficulty in walking, not elsewhere classified (R26.2)     Time: [id number] PT Time Calculation (min) (ACUTE ONLY): 28 min  Charges:  $Gait Training: 8-22 mins $Therapeutic Exercise: 8-22 mins                    G Codes:       {[name]  PTA [facility]  Acute  Rehab Pager      [phone]

## 2016-09-26 NOTE — Progress Notes (Signed)
Pt has an elevated temp of 102.3. Gave pt 650mg  of tylenol and rechecked temp and it came down to 101.2. Notified on call physician for [place]. Received a call back from Dr. Artis DelayJ. Rogers. No new orders at this time.

## 2016-09-26 NOTE — Evaluation (Signed)
Occupational Therapy Evaluation Patient Details Name: [name] MRN: [id number] DOB: 1935/09/07 Today's Date: 09/26/2016    History of Present Illness 81 yo male s/p R TKA 09/25/16. Hx of legally blind   Clinical Impression   Pt is s/p TKA resulting in the deficits listed below (see OT Problem List).  Pt will benefit from skilled OT to increase their safety and independence with ADL and functional mobility for ADL to facilitate discharge to venue listed below.        Follow Up Recommendations  DC plan and follow up therapy as arranged by surgeon    Equipment Recommendations  3 in 1 bedside commode       Precautions / Restrictions Precautions Precautions: Fall Required Braces or Orthoses: Knee Immobilizer - Right Knee Immobilizer - Right: Discontinue once straight leg raise with < 10 degree lag Restrictions Weight Bearing Restrictions: No RLE Weight Bearing: Weight bearing as tolerated      Mobility Bed Mobility               General bed mobility comments: pt in chair  Transfers Overall transfer level: Needs assistance Equipment used: Rolling walker (2 wheeled) Transfers: Sit to/from Stand Sit to Stand: Min assist         General transfer comment: VC for hand placement    Balance                                           ADL either performed or assessed with clinical judgement   ADL Overall ADL's : Needs assistance/impaired Eating/Feeding: Set up;Sitting   Grooming: Minimal assistance;Sitting   Upper Body Bathing: Minimal assistance;Sitting   Lower Body Bathing: Moderate assistance;Sit to/from stand;Cueing for safety;Cueing for sequencing   Upper Body Dressing : Minimal assistance;Sitting   Lower Body Dressing: Moderate assistance;Sit to/from stand;Cueing for safety;Cueing for sequencing   Toilet Transfer: Moderate assistance;RW Toilet Transfer Details (indicate cue type and reason): urinal Toileting- Clothing Manipulation  and Hygiene: Moderate assistance;Sit to/from stand         General ADL Comments: pt did put in contact which helps him have some vision.       Vision Baseline Vision/History: Legally blind Patient Visual Report: No change from baseline              Pertinent Vitals/Pain Pain Score: 3  Pain Location: R knee with activity Pain Descriptors / Indicators: Aching;Sore Pain Intervention(s): Limited activity within patient's tolerance;Monitored during session     Hand Dominance     Extremity/Trunk Assessment Upper Extremity Assessment Upper Extremity Assessment: Overall WFL for tasks assessed           Communication Communication Communication: No difficulties   Cognition Arousal/Alertness: Awake/alert Behavior During Therapy: WFL for tasks assessed/performed Overall Cognitive Status: Within Functional Limits for tasks assessed                                                Home Living Family/patient expects to be discharged to:: Private residence Living Arrangements: Alone Available Help at Discharge: Family Type of Home: House Home Access: Stairs to enter Entergy CorporationEntrance Stairs-Number of Steps: 1 + threshold (front) Entrance Stairs-Rails: None Home Layout: One level  Home Equipment: Walker - 2 wheels;Cane - single point          Prior Functioning/Environment Level of Independence: Independent with assistive device(s)        Comments: using cane for ambulation        OT Problem List: Decreased strength;Decreased activity tolerance;Impaired balance (sitting and/or standing);Decreased safety awareness;Impaired vision/perception;Decreased knowledge of use of DME or AE      OT Treatment/Interventions: Self-care/ADL training;Patient/family education;DME and/or AE instruction    OT Goals(Current goals can be found in the care plan section) Acute Rehab OT Goals Patient Stated Goal: less pain. regain independence OT Goal  Formulation: With patient Time For Goal Achievement: 10/10/16 Potential to Achieve Goals: Good  OT Frequency: Min 2X/week   Barriers to [name]/C:            Co-evaluation              AM-PAC PT "6 Clicks" Daily Activity     Outcome Measure Help from another person eating meals?: A Little Help from another person taking care of personal grooming?: A Little Help from another person toileting, which includes using toliet, bedpan, or urinal?: A Lot Help from another person bathing (including washing, rinsing, drying)?: A Lot Help from another person to put on and taking off regular upper body clothing?: A Little Help from another person to put on and taking off regular lower body clothing?: A Lot 6 Click Score: 15   End of Session Equipment Utilized During Treatment: Rolling walker Nurse Communication: Mobility status  Activity Tolerance: Patient tolerated treatment well Patient left: in chair;with call bell/phone within reach;with family/visitor present  OT Visit Diagnosis: Unsteadiness on feet (R26.81);Pain;Low vision, both eyes (H54.2)                Time: 7829-56211055-1131 OT Time Calculation (min): 36 min Charges:  OT General Charges $OT Visit: 1 Procedure OT Evaluation $OT Eval Moderate Complexity: 1 Procedure OT Treatments $Self Care/Home Management : 23-37 mins G-Codes:     Dave AuerLori Siri [name], OT [phone]  Dave CrowEDDING, [name] 09/26/2016, 11:55 AM

## 2016-09-27 ENCOUNTER — Inpatient Hospital Stay (HOSPITAL_COMMUNITY): Payer: Medicare Other

## 2016-09-27 LAB — URINALYSIS, ROUTINE W REFLEX MICROSCOPIC
Bilirubin Urine: NEGATIVE
Glucose, UA: NEGATIVE mg/dL
Hgb urine dipstick: NEGATIVE
KETONES UR: 5 mg/dL — AB
LEUKOCYTES UA: NEGATIVE
NITRITE: NEGATIVE
PROTEIN: NEGATIVE mg/dL
Specific Gravity, Urine: 1.014 (ref 1.005–1.030)
pH: 6 (ref 5.0–8.0)

## 2016-09-27 LAB — BASIC METABOLIC PANEL
ANION GAP: 9 (ref 5–15)
BUN: 15 mg/dL (ref 6–20)
CALCIUM: 8.4 mg/dL — AB (ref 8.9–10.3)
CHLORIDE: 105 mmol/L (ref 101–111)
CO2: 24 mmol/L (ref 22–32)
Creatinine, Ser: 1.13 mg/dL (ref 0.61–1.24)
GFR calc Af Amer: >60 mL/min (ref >60)
GFR, EST NON AFRICAN AMERICAN: 60 mL/min — AB (ref >60)
GLUCOSE: 108 mg/dL — AB (ref 65–99)
POTASSIUM: 3.2 mmol/L — AB (ref 3.5–5.1)
Sodium: 138 mmol/L (ref 135–145)

## 2016-09-27 LAB — CBC
HEMATOCRIT: 28.9 % — AB (ref 39.0–52.0)
HEMOGLOBIN: 10.2 g/dL — AB (ref 13.0–17.0)
MCH: 30.6 pg (ref 26.0–34.0)
MCHC: 35.3 g/dL (ref 30.0–36.0)
MCV: 86.8 fL (ref 78.0–100.0)
Platelets: 131 10*3/uL — ABNORMAL LOW (ref 150–400)
RBC: 3.33 MIL/uL — AB (ref 4.22–5.81)
RDW: 13.8 % (ref 11.5–15.5)
WBC: 8.8 10*3/uL (ref 4.0–10.5)

## 2016-09-27 MED ORDER — POTASSIUM CHLORIDE CRYS ER 20 MEQ PO TBCR
40.0000 meq | EXTENDED_RELEASE_TABLET | Freq: Every day | ORAL | Status: DC
  Administered 2016-09-27 – 2016-09-28 (×2): 40 meq via ORAL
  Filled 2016-09-27 (×2): qty 2

## 2016-09-27 NOTE — Progress Notes (Signed)
Physical Therapy Treatment Patient Details Name: Dave AskewBobby D Petitjean MRN: [id number] DOB: Apr 10, 1935 Today's Date: 09/27/2016    History of Present Illness 81 yo male s/p R TKA 09/25/16. Hx of legally blind    PT Comments    POD # 2 post op confusion Applied KI and assist OOB to amb an increased distance.  Returned to bed to perform TKR TE's followed by ICE.   Follow Up Recommendations  DC plan and follow up therapy as arranged by surgeon ([facility])     Equipment Recommendations  None recommended by PT    Recommendations for Other Services       Precautions / Restrictions Precautions Precautions: Fall Precaution Comments: instructed on KI use for amb and stairs.  Pt has low vision Required Braces or Orthoses: Knee Immobilizer - Right Knee Immobilizer - Right: Discontinue once straight leg raise with < 10 degree lag Restrictions RLE Weight Bearing: Weight bearing as tolerated    Mobility  Bed Mobility Overal bed mobility: Needs Assistance Bed Mobility: Supine to Sit;Sit to Supine     Supine to sit: Min assist Sit to supine: Min assist   General bed mobility comments: assist for R LE and increased time back to bed  Transfers Overall transfer level: Needs assistance Equipment used: Rolling walker (2 wheeled) Transfers: Sit to/from Stand Sit to Stand: Min assist;Min guard         General transfer comment: 50% VC's on proper hand placement and increased time to rise (pt very tall)  Ambulation/Gait Ambulation/Gait assistance: Min guard;Min assist Ambulation Distance (Feet): 65 Feet Assistive device: Rolling walker (2 wheeled) Gait Pattern/deviations: Step-to pattern;Trunk flexed Gait velocity: decreased   General Gait Details: 50% VC's on proper walker to self distance and upright posture.  Hx cervical ROM limitation positioned in forward flextion.     Stairs            Wheelchair Mobility    Modified Rankin (Stroke Patients Only)       Balance                                            Cognition Arousal/Alertness: Awake/alert Behavior During Therapy: WFL for tasks assessed/performed Overall Cognitive Status: Within Functional Limits for tasks assessed                                        Exercises   Total Knee Replacement TE's 10 reps B LE ankle pumps 10 reps towel squeezes 10 reps knee presses 10 reps heel slides  10 reps SAQ's 10 reps SLR's 10 reps ABD Followed by ICE    General Comments        Pertinent Vitals/Pain Pain Assessment: Faces Faces Pain Scale: Hurts little more Pain Location: R knee with activity Pain Descriptors / Indicators: Aching;Sore;Operative site guarding Pain Intervention(s): Monitored during session;Repositioned;Ice applied    Home Living                      Prior Function            PT Goals (current goals can now be found in the care plan section) Progress towards PT goals: Progressing toward goals    Frequency    7X/week      PT Plan Current plan remains  appropriate    Co-evaluation              AM-PAC PT "6 Clicks" Daily Activity  Outcome Measure  Difficulty turning over in bed (including adjusting bedclothes, sheets and blankets)?: Total Difficulty moving from lying on back to sitting on the side of the bed? : Total Difficulty sitting down on and standing up from a chair with arms (e.g., wheelchair, bedside commode, etc,.)?: Total Help needed moving to and from a bed to chair (including a wheelchair)?: A Lot Help needed walking in hospital room?: A Lot Help needed climbing 3-5 steps with a railing? : A Lot 6 Click Score: 9    End of Session Equipment Utilized During Treatment: Gait belt;Right knee immobilizer Activity Tolerance: Patient tolerated treatment well Patient left: in bed;with nursing/sitter in room;with family/visitor present Nurse Communication: Mobility status PT Visit Diagnosis: Unsteadiness on feet  (R26.81)     Time: [id number] PT Time Calculation (min) (ACUTE ONLY): 33 min  Charges:  $Gait Training: 8-22 mins $Therapeutic Exercise: 8-22 mins                    G Codes:       Felecia ShellingLori Marvetta Vohs  PTA [facility]  Acute  Rehab Pager      [phone]

## 2016-09-27 NOTE — Progress Notes (Signed)
Orthopedic Tech Progress Note Patient Details:  [name] 12-23-1935 [id number]  CPM Right Knee CPM Right Knee: Other (Comment) Right Knee Flexion (Degrees): 40 Right Knee Extension (Degrees): 10 Additional Comments: Pt polietly refused CPM for 2nd 2 hour session.   [name] 09/27/2016, 6:39 PM

## 2016-09-27 NOTE — Progress Notes (Signed)
   Subjective: 2 Days Post-Op Procedure(s) (LRB): RIGHT TOTAL KNEE ARTHROPLASTY (Right) Patient reports pain as mild.   Patient seen in rounds for Dr. Lequita HaltAluisio. Patient is well, but has had some minor complaints of pain in the knee, requiring pain medications and confusion that developed the evening before had improved. Plan is to go Home after hospital stay.  Objective: Vital signs in last 24 hours: Temp:  [98.6 F (37 C)-102.3 F (39.1 C)] 101.3 F (38.5 C) (08/01 0644) Pulse Rate:  [52-81] 73 (08/01 0644) Resp:  [14-18] 16 (08/01 0644) BP: (133-164)/(51-57) 156/53 (08/01 0644) SpO2:  [99 %-100 %] 99 % (08/01 0644)  Intake/Output from previous day:  Intake/Output Summary (Last 24 hours) at 09/27/16 0810 Last data filed at 09/27/16 0757  Gross per 24 hour  Intake           1119.5 ml  Output             1325 ml  Net           -205.5 ml    Intake/Output this shift: Total I/O In: 120 [P.O.:120] Out: -   Labs:  Recent Labs  09/26/16 0557 09/27/16 0620  HGB 11.8* 10.2*    Recent Labs  09/26/16 0557 09/27/16 0620  WBC 9.6 8.8  RBC 3.75* 3.33*  HCT 32.3* 28.9*  PLT 128* 131*    Recent Labs  09/26/16 0557 09/27/16 0620  NA 138 138  K 4.1 3.2*  CL 104 105  CO2 26 24  BUN 15 15  CREATININE 1.13 1.13  GLUCOSE 140* 108*  CALCIUM 8.7* 8.4*   No results for input(s): LABPT, INR in the last 72 hours.  EXAM General - Patient is Alert, Appropriate and Oriented times three at time of rounds. Extremity - Neurovascular intact Sensation intact distally Intact pulses distally Dorsiflexion/Plantar flexion intact Dressing/Incision - clean, dry Motor Function - intact, moving foot and toes well on exam.   Past Medical History:  Diagnosis Date  . Anemia   . Arthritis   . Complication of anesthesia    HARD TO WAKE UP  . Constipation   . DJD (degenerative joint disease)   . Glaucoma   . Headache    hx of  . Heart murmur   . History of kidney stones   .  HTN (hypertension)   . Otitis media    with intermittent vertigo  . Pneumonia   . PONV (postoperative nausea and vomiting)   . Weakness    of neck muscles    Assessment/Plan: 2 Days Post-Op Procedure(s) (LRB): RIGHT TOTAL KNEE ARTHROPLASTY (Right) Active Problems:   OA (osteoarthritis) of knee  Estimated body mass index is 26.87 kg/m as calculated from the following:   Height as of this encounter: 6\' 3"  (1.905 m).   Weight as of this encounter: 97.5 kg (215 lb). Up with therapy  Check CXR and UA due to elevated temp.  DVT Prophylaxis - Xarelto Weight-Bearing as tolerated to right leg Spiked temp last night - will check CXR and UA Monitor for mental status changes   Avel Peacerew Teriah Muela, PA-C Orthopaedic Surgery 09/27/2016, 8:10 AM

## 2016-09-27 NOTE — Progress Notes (Signed)
Physical Therapy Treatment Patient Details Name: [name] MRN: [id number] DOB: 04-22-1935 Today's Date: 09/27/2016    History of Present Illness 81 yo male s/p R TKA 09/25/16. Hx of legally blind    PT Comments    POD # 2 pm session Assisted OOB to amb a second time then assisted back to bed and placed in cpm.  Increased flex from 40 degrees to 55.  Applied ICE as well. Tolerating well.  Plans to stay one more day due to increased confusion last night.    Follow Up Recommendations  DC plan and follow up therapy as arranged by surgeon ([facility])     Equipment Recommendations  None recommended by PT    Recommendations for Other Services       Precautions / Restrictions Precautions Precautions: Fall Precaution Comments: instructed on KI use for amb and stairs.  Pt has low vision Required Braces or Orthoses: Knee Immobilizer - Right Knee Immobilizer - Right: Discontinue once straight leg raise with < 10 degree lag Restrictions RLE Weight Bearing: Weight bearing as tolerated    Mobility  Bed Mobility Overal bed mobility: Needs Assistance Bed Mobility: Supine to Sit;Sit to Supine     Supine to sit: Min assist Sit to supine: Min assist   General bed mobility comments: assist for R LE and increased time back to bed  Transfers Overall transfer level: Needs assistance Equipment used: Rolling walker (2 wheeled) Transfers: Sit to/from Stand Sit to Stand: Min assist;Min guard         General transfer comment: 50% VC's on proper hand placement and increased time to rise (pt very tall)  Ambulation/Gait Ambulation/Gait assistance: Min guard;Min assist Ambulation Distance (Feet): 65 Feet Assistive device: Rolling walker (2 wheeled) Gait Pattern/deviations: Step-to pattern;Trunk flexed Gait velocity: decreased   General Gait Details: 50% VC's on proper walker to self distance and upright posture.  Hx cervical ROM limitation positioned in forward flextion.     Stairs            Wheelchair Mobility    Modified Rankin (Stroke Patients Only)       Balance                                            Cognition Arousal/Alertness: Awake/alert Behavior During Therapy: WFL for tasks assessed/performed Overall Cognitive Status: Within Functional Limits for tasks assessed                                        Exercises      General Comments        Pertinent Vitals/Pain Pain Assessment: Faces Faces Pain Scale: Hurts little more Pain Location: R knee with activity Pain Descriptors / Indicators: Aching;Sore;Operative site guarding Pain Intervention(s): Monitored during session;Repositioned;Ice applied    Home Living                      Prior Function            PT Goals (current goals can now be found in the care plan section) Progress towards PT goals: Progressing toward goals    Frequency    7X/week      PT Plan Current plan remains appropriate    Co-evaluation  AM-PAC PT "6 Clicks" Daily Activity  Outcome Measure  Difficulty turning over in bed (including adjusting bedclothes, sheets and blankets)?: Total Difficulty moving from lying on back to sitting on the side of the bed? : Total Difficulty sitting down on and standing up from a chair with arms (e.g., wheelchair, bedside commode, etc,.)?: Total Help needed moving to and from a bed to chair (including a wheelchair)?: A Lot Help needed walking in hospital room?: A Lot Help needed climbing 3-5 steps with a railing? : A Lot 6 Click Score: 9    End of Session Equipment Utilized During Treatment: Gait belt;Right knee immobilizer Activity Tolerance: Patient tolerated treatment well Patient left: in bed;with nursing/sitter in room;with family/visitor present Nurse Communication: Mobility status PT Visit Diagnosis: Unsteadiness on feet (R26.81)     Time: [id number] PT Time Calculation (min) (ACUTE ONLY): 24  min  Charges:  $Gait Training: 8-22 mins $Therapeutic Activity: 8-22 mins                    G Codes:       Felecia ShellingLori Seanna Sisler  PTA [facility]  Acute  Rehab Pager      [phone]

## 2016-09-27 NOTE — Progress Notes (Signed)
OT Cancellation Note  Patient Details Name: Dave AskewBobby D [name] MRN: [id number] DOB: 1935-11-13   Cancelled Treatment:    Reason Eval/Treat Not Completed: Fatigue/lethargy limiting ability to participate  Will check on pt later in day or next day  Lise AuerLori Naeem Quillin, OT [phone]  Law[facility] HospitalREDDING, Karin GoldenLorraine D 09/27/2016, 1:25 PM

## 2016-09-28 LAB — CBC
HEMATOCRIT: 26.7 % — AB (ref 39.0–52.0)
HEMOGLOBIN: 9.5 g/dL — AB (ref 13.0–17.0)
MCH: 31.5 pg (ref 26.0–34.0)
MCHC: 35.6 g/dL (ref 30.0–36.0)
MCV: 88.4 fL (ref 78.0–100.0)
Platelets: 125 10*3/uL — ABNORMAL LOW (ref 150–400)
RBC: 3.02 MIL/uL — AB (ref 4.22–5.81)
RDW: 14 % (ref 11.5–15.5)
WBC: 9.1 10*3/uL (ref 4.0–10.5)

## 2016-09-28 LAB — BASIC METABOLIC PANEL
Anion gap: 7 (ref 5–15)
BUN: 13 mg/dL (ref 6–20)
CALCIUM: 8.2 mg/dL — AB (ref 8.9–10.3)
CO2: 24 mmol/L (ref 22–32)
Chloride: 105 mmol/L (ref 101–111)
Creatinine, Ser: 0.99 mg/dL (ref 0.61–1.24)
GLUCOSE: 104 mg/dL — AB (ref 65–99)
POTASSIUM: 3.3 mmol/L — AB (ref 3.5–5.1)
SODIUM: 136 mmol/L (ref 135–145)

## 2016-09-28 MED ORDER — TRAMADOL HCL 50 MG PO TABS: 50.0000 mg | ORAL_TABLET | Freq: Four times a day (QID) | ORAL | 0 refills | Status: DC | PRN

## 2016-09-28 MED ORDER — RIVAROXABAN 10 MG PO TABS: 10.0000 mg | ORAL_TABLET | Freq: Every day | ORAL | 0 refills | Status: DC

## 2016-09-28 NOTE — Progress Notes (Signed)
Physical Therapy Treatment Patient Details Name: Dave AskewBobby D Fleig MRN: [id number] DOB: 1935/03/14 Today's Date: 09/28/2016    History of Present Illness 81 yo male s/p R TKA 09/25/16. Hx of legally blind    PT Comments    POD # 3 With daughter present for "hands on" instruction on all listed below.  Practiced stairs and performed TE's.  Instructed on proper tech and use of ICE.  Pt D/C home with [facility].   Follow Up Recommendations  DC plan and follow up therapy as arranged by surgeon ([facility])     Equipment Recommendations  None recommended by PT    Recommendations for Other Services       Precautions / Restrictions Precautions Precautions: Fall Precaution Comments: pt able to perform active SLR so did not use KI POD # 3 Restrictions Weight Bearing Restrictions: No RLE Weight Bearing: Weight bearing as tolerated    Mobility  Bed Mobility Overal bed mobility: Needs Assistance Bed Mobility: Supine to Sit;Sit to Supine     Supine to sit: Min guard;Supervision Sit to supine: Supervision;Min guard   General bed mobility comments: pt uses opposite LE to assist r LE on/off bed with use of rail as well    Transfers Overall transfer level: Needs assistance Equipment used: Rolling walker (2 wheeled) Transfers: Sit to/from Stand Sit to Stand: Supervision;Min guard         General transfer comment: increased time from elevated surface  Ambulation/Gait Ambulation/Gait assistance: Supervision;Min guard Ambulation Distance (Feet): 55 Feet Assistive device: Rolling walker (2 wheeled) Gait Pattern/deviations: Step-to pattern;Trunk flexed Gait velocity: decreased   General Gait Details: 25% VC's on proper walker to self distance and safety with turns.  Amb with daughter "hands on" with instruction on safe handling.    Stairs Stairs: Yes   Stair Management: No rails;With walker Number of Stairs: 3 General stair comments: with daughter "hands on" assist practiced one step  forward with walker and 2 steps forward with walker at 25% VC's on proper tech and safety  Wheelchair Mobility    Modified Rankin (Stroke Patients Only)       Balance                                            Cognition Arousal/Alertness: Awake/alert Behavior During Therapy: WFL for tasks assessed/performed Overall Cognitive Status: Within Functional Limits for tasks assessed                                        Exercises   Total Knee Replacement TE's 10 reps B LE ankle pumps 10 reps towel squeezes 10 reps knee presses 10 reps heel slides  10 reps SAQ's 10 reps SLR's 10 reps ABD Followed by ICE     General Comments        Pertinent Vitals/Pain Pain Assessment: Faces Faces Pain Scale: Hurts little more Pain Location: R knee with activity Pain Descriptors / Indicators: Aching;Sore;Operative site guarding Pain Intervention(s): Premedicated before session;Repositioned;Ice applied    Home Living                      Prior Function            PT Goals (current goals can now be found in the care plan section) Progress  towards PT goals: Progressing toward goals    Frequency    7X/week      PT Plan Current plan remains appropriate    Co-evaluation              AM-PAC PT "6 Clicks" Daily Activity  Outcome Measure  Difficulty turning over in bed (including adjusting bedclothes, sheets and blankets)?: Total Difficulty moving from lying on back to sitting on the side of the bed? : Total Difficulty sitting down on and standing up from a chair with arms (e.g., wheelchair, bedside commode, etc,.)?: Total Help needed moving to and from a bed to chair (including a wheelchair)?: Total Help needed walking in hospital room?: Total Help needed climbing 3-5 steps with a railing? : Total 6 Click Score: 6    End of Session Equipment Utilized During Treatment: Gait belt Activity Tolerance: Patient tolerated  treatment well Patient left: in bed;with nursing/sitter in room;with family/visitor present Nurse Communication:  (pt ready for D/C to home) PT Visit Diagnosis: Unsteadiness on feet (R26.81)     Time: [id number] PT Time Calculation (min) (ACUTE ONLY): 30 min  Charges:  $Gait Training: 8-22 mins $Therapeutic Exercise: 8-22 mins                    G Codes:       Felecia ShellingLori Eitan Doubleday  PTA [facility]  Acute  Rehab Pager      [phone]

## 2016-09-28 NOTE — Progress Notes (Signed)
Pt to d/c home. Has all DME and plans on [facility] for therapy. Patient's mentation has greatly improved. AVS reviewed and "My Chart" discussed with pt and daughter. Pt capable of verbalizing medications, dressing changes, signs and symptoms of infection, and follow-up appointments. Remains hemodynamically stable. No signs and symptoms of distress. Educated pt to return to ER in the case of SOB, dizziness, or chest pain.

## 2016-09-28 NOTE — Progress Notes (Signed)
Physician Discharge Summary   Patient ID: [name] MRN: [id number] DOB/AGE: [birth year] 79 y.o.  Admit date: 09/25/2016 Discharge date: 09/28/2016  Primary Diagnosis:  Osteoarthritis  Right knee(s)  Admission Diagnoses:  Past Medical History:  Diagnosis Date  . Anemia   . Arthritis   . Complication of anesthesia    HARD TO WAKE UP  . Constipation   . DJD (degenerative joint disease)   . Glaucoma   . Headache    hx of  . Heart murmur   . History of kidney stones   . HTN (hypertension)   . Otitis media    with intermittent vertigo  . Pneumonia   . PONV (postoperative nausea and vomiting)   . Weakness    of neck muscles   Discharge Diagnoses:   Active Problems:   OA (osteoarthritis) of knee  Estimated body mass index is 26.87 kg/m as calculated from the following:   Height as of this encounter: 6' 3" (1.905 m).   Weight as of this encounter: 97.5 kg (215 lb).  Procedure:  Procedure(s) (LRB): RIGHT TOTAL KNEE ARTHROPLASTY (Right)   Consults: None  HPI: [name] is a 81 y.o. year old male with end stage OA of his right knee with progressively worsening pain and dysfunction. He has constant pain, with activity and at rest and significant functional deficits with difficulties even with ADLs. He has had extensive non-op management including analgesics, injections of cortisone and viscosupplements, and home exercise program, but remains in significant pain with significant dysfunction. Radiographs show bone on bone arthritis lateral and patellofemoral with valgus deformity. He presents now for right Total Knee Arthroplasty.    Laboratory Data: Admission on 09/25/2016  Component Date Value Ref Range Status  . WBC 09/26/2016 9.6  4.0 - 10.5 K/uL Final  . RBC 09/26/2016 3.75* 4.22 - 5.81 MIL/uL Final  . Hemoglobin 09/26/2016 11.8* 13.0 - 17.0 g/dL Final  . HCT 09/26/2016 32.3* 39.0 - 52.0 % Final  . MCV 09/26/2016 86.1  78.0 - 100.0 fL Final  . MCH 09/26/2016 31.5   26.0 - 34.0 pg Final  . MCHC 09/26/2016 36.5* 30.0 - 36.0 g/dL Final  . RDW 09/26/2016 13.7  11.5 - 15.5 % Final  . Platelets 09/26/2016 128* 150 - 400 K/uL Final  . Sodium 09/26/2016 138  135 - 145 mmol/L Final  . Potassium 09/26/2016 4.1  3.5 - 5.1 mmol/L Final  . Chloride 09/26/2016 104  101 - 111 mmol/L Final  . CO2 09/26/2016 26  22 - 32 mmol/L Final  . Glucose, Bld 09/26/2016 140* 65 - 99 mg/dL Final  . BUN 09/26/2016 15  6 - 20 mg/dL Final  . Creatinine, Ser 09/26/2016 1.13  0.61 - 1.24 mg/dL Final  . Calcium 09/26/2016 8.7* 8.9 - 10.3 mg/dL Final  . GFR calc non Af Amer 09/26/2016 60* >60 mL/min Final  . GFR calc Af Amer 09/26/2016 >60  >60 mL/min Final   Comment: (NOTE) The eGFR has been calculated using the CKD EPI equation. This calculation has not been validated in all clinical situations. eGFR's persistently <60 mL/min signify possible Chronic Kidney Disease.   . Anion gap 09/26/2016 8  5 - 15 Final  . WBC 09/27/2016 8.8  4.0 - 10.5 K/uL Final  . RBC 09/27/2016 3.33* 4.22 - 5.81 MIL/uL Final  . Hemoglobin 09/27/2016 10.2* 13.0 - 17.0 g/dL Final  . HCT 09/27/2016 28.9* 39.0 - 52.0 % Final  . MCV 09/27/2016 86.8  78.0 -  100.0 fL Final  . MCH 09/27/2016 30.6  26.0 - 34.0 pg Final  . MCHC 09/27/2016 35.3  30.0 - 36.0 g/dL Final  . RDW 09/27/2016 13.8  11.5 - 15.5 % Final  . Platelets 09/27/2016 131* 150 - 400 K/uL Final  . Sodium 09/27/2016 138  135 - 145 mmol/L Final  . Potassium 09/27/2016 3.2* 3.5 - 5.1 mmol/L Final   Comment: DELTA CHECK NOTED REPEATED TO VERIFY   . Chloride 09/27/2016 105  101 - 111 mmol/L Final  . CO2 09/27/2016 24  22 - 32 mmol/L Final  . Glucose, Bld 09/27/2016 108* 65 - 99 mg/dL Final  . BUN 09/27/2016 15  6 - 20 mg/dL Final  . Creatinine, Ser 09/27/2016 1.13  0.61 - 1.24 mg/dL Final  . Calcium 09/27/2016 8.4* 8.9 - 10.3 mg/dL Final  . GFR calc non Af Amer 09/27/2016 60* >60 mL/min Final  . GFR calc Af Amer 09/27/2016 >60  >60 mL/min Final    Comment: (NOTE) The eGFR has been calculated using the CKD EPI equation. This calculation has not been validated in all clinical situations. eGFR's persistently <60 mL/min signify possible Chronic Kidney Disease.   . Anion gap 09/27/2016 9  5 - 15 Final  . Color, Urine 09/27/2016 YELLOW  YELLOW Final  . APPearance 09/27/2016 CLEAR  CLEAR Final  . Specific Gravity, Urine 09/27/2016 1.014  1.005 - 1.030 Final  . pH 09/27/2016 6.0  5.0 - 8.0 Final  . Glucose, UA 09/27/2016 NEGATIVE  NEGATIVE mg/dL Final  . Hgb urine dipstick 09/27/2016 NEGATIVE  NEGATIVE Final  . Bilirubin Urine 09/27/2016 NEGATIVE  NEGATIVE Final  . Ketones, ur 09/27/2016 5* NEGATIVE mg/dL Final  . Protein, ur 09/27/2016 NEGATIVE  NEGATIVE mg/dL Final  . Nitrite 09/27/2016 NEGATIVE  NEGATIVE Final  . Leukocytes, UA 09/27/2016 NEGATIVE  NEGATIVE Final  . WBC 09/28/2016 9.1  4.0 - 10.5 K/uL Final  . RBC 09/28/2016 3.02* 4.22 - 5.81 MIL/uL Final  . Hemoglobin 09/28/2016 9.5* 13.0 - 17.0 g/dL Final  . HCT 09/28/2016 26.7* 39.0 - 52.0 % Final  . MCV 09/28/2016 88.4  78.0 - 100.0 fL Final  . MCH 09/28/2016 31.5  26.0 - 34.0 pg Final  . MCHC 09/28/2016 35.6  30.0 - 36.0 g/dL Final  . RDW 09/28/2016 14.0  11.5 - 15.5 % Final  . Platelets 09/28/2016 125* 150 - 400 K/uL Final  . Sodium 09/28/2016 136  135 - 145 mmol/L Final  . Potassium 09/28/2016 3.3* 3.5 - 5.1 mmol/L Final  . Chloride 09/28/2016 105  101 - 111 mmol/L Final  . CO2 09/28/2016 24  22 - 32 mmol/L Final  . Glucose, Bld 09/28/2016 104* 65 - 99 mg/dL Final  . BUN 09/28/2016 13  6 - 20 mg/dL Final  . Creatinine, Ser 09/28/2016 0.99  0.61 - 1.24 mg/dL Final  . Calcium 09/28/2016 8.2* 8.9 - 10.3 mg/dL Final  . GFR calc non Af Amer 09/28/2016 >60  >60 mL/min Final  . GFR calc Af Amer 09/28/2016 >60  >60 mL/min Final   Comment: (NOTE) The eGFR has been calculated using the CKD EPI equation. This calculation has not been validated in all clinical  situations. eGFR's persistently <60 mL/min signify possible Chronic Kidney Disease.   Georgiann Hahn gap 09/28/2016 7  5 - 15 Final  Hospital Outpatient Visit on 09/18/2016  Component Date Value Ref Range Status  . aPTT 09/18/2016 35  24 - 36 seconds Final  . WBC 09/18/2016 3.9* 4.0 -  10.5 K/uL Final  . RBC 09/18/2016 3.93* 4.22 - 5.81 MIL/uL Final  . Hemoglobin 09/18/2016 12.2* 13.0 - 17.0 g/dL Final  . HCT 09/18/2016 34.9* 39.0 - 52.0 % Final  . MCV 09/18/2016 88.8  78.0 - 100.0 fL Final  . MCH 09/18/2016 31.0  26.0 - 34.0 pg Final  . MCHC 09/18/2016 35.0  30.0 - 36.0 g/dL Final  . RDW 09/18/2016 13.7  11.5 - 15.5 % Final  . Platelets 09/18/2016 170  150 - 400 K/uL Final  . Sodium 09/18/2016 140  135 - 145 mmol/L Final  . Potassium 09/18/2016 3.8  3.5 - 5.1 mmol/L Final  . Chloride 09/18/2016 107  101 - 111 mmol/L Final  . CO2 09/18/2016 28  22 - 32 mmol/L Final  . Glucose, Bld 09/18/2016 87  65 - 99 mg/dL Final  . BUN 09/18/2016 12  6 - 20 mg/dL Final  . Creatinine, Ser 09/18/2016 1.08  0.61 - 1.24 mg/dL Final  . Calcium 09/18/2016 9.1  8.9 - 10.3 mg/dL Final  . Total Protein 09/18/2016 7.3  6.5 - 8.1 g/dL Final  . Albumin 09/18/2016 3.8  3.5 - 5.0 g/dL Final  . AST 09/18/2016 29  15 - 41 U/L Final  . ALT 09/18/2016 19  17 - 63 U/L Final  . Alkaline Phosphatase 09/18/2016 67  38 - 126 U/L Final  . Total Bilirubin 09/18/2016 0.7  0.3 - 1.2 mg/dL Final  . GFR calc non Af Amer 09/18/2016 >60  >60 mL/min Final  . GFR calc Af Amer 09/18/2016 >60  >60 mL/min Final   Comment: (NOTE) The eGFR has been calculated using the CKD EPI equation. This calculation has not been validated in all clinical situations. eGFR's persistently <60 mL/min signify possible Chronic Kidney Disease.   . Anion gap 09/18/2016 5  5 - 15 Final  . Prothrombin Time 09/18/2016 14.9  11.4 - 15.2 seconds Final  . INR 09/18/2016 1.17   Final  . ABO/RH(D) 09/18/2016 B POS   Final  . Antibody Screen 09/18/2016 NEG    Final  . Sample Expiration 09/18/2016 09/28/2016   Final  . Extend sample reason 09/18/2016 NO TRANSFUSIONS OR PREGNANCY IN THE PAST 3 MONTHS   Final  . MRSA, PCR 09/18/2016 NEGATIVE  NEGATIVE Final  . Staphylococcus aureus 09/18/2016 NEGATIVE  NEGATIVE Final   Comment:        The Xpert SA Assay (FDA approved for NASAL specimens in patients over 80 years of age), is one component of a comprehensive surveillance program.  Test performance has been validated by [facility] for patients greater than or equal to 61 year old. It is not intended to diagnose infection nor to guide or monitor treatment.      X-Rays:Dg Chest Port 1 View  Result Date: 09/27/2016 CLINICAL DATA:  Status post right knee joint replacement Monday with post- operative shortness of breath and cough. History of hypertension nonsmoker. EXAM: PORTABLE CHEST 1 VIEW COMPARISON:  Chest x-ray of October 08, 2013 FINDINGS: The lungs are well-expanded. There is subtle increased density in the right infrahilar region and at the left lung base. The heart and pulmonary vascularity are normal. There is calcification in the wall of the aortic arch. There is no pleural effusion or pneumothorax. IMPRESSION: Subsegmental atelectasis in the right infrahilar region and at the left lung base. No alveolar pneumonia or CHF. Thoracic aortic atherosclerosis. Electronically Signed   By: [name]  Martinique M.D.   On: 09/27/2016 08:38  EKG: Orders placed or performed during the hospital encounter of 09/18/16  . EKG 12 lead  . EKG 12 lead     Hospital Course: [name] is a 81 y.o. who was admitted to [facility]. They were brought to the operating room on 09/25/2016 and underwent Procedure(s): RIGHT TOTAL KNEE ARTHROPLASTY.  Patient tolerated the procedure well and was later transferred to the recovery room and then to the orthopaedic floor for postoperative care.  They were given PO and IV analgesics for pain control following their  surgery.  They were given 24 hours of postoperative antibiotics of  Anti-infectives    Start     Dose/Rate Route Frequency Ordered Stop   09/25/16 1300  ceFAZolin (ANCEF) IVPB 2g/100 mL premix     2 g 200 mL/hr over 30 Minutes Intravenous Every 6 hours 09/25/16 1025 09/25/16 1830   09/25/16 0520  ceFAZolin (ANCEF) 2-4 GM/100ML-% IVPB    Comments:  Waldron Session   : cabinet override      09/25/16 0520 09/25/16 0715   09/25/16 0517  ceFAZolin (ANCEF) IVPB 2g/100 mL premix     2 g 200 mL/hr over 30 Minutes Intravenous On call to O.R. 09/25/16 5537 09/25/16 0725     and started on DVT prophylaxis in the form of Xarelto.   PT and OT were ordered for total joint protocol.  Discharge planning consulted to help with postop disposition and equipment needs.  Patient had a decent night on the evening of surgery and got up and walked about 20 feet in the room the day of surgery. POD 1 - They started to get up OOB with therapy on day one. Hemovac drain was pulled without difficulty.  Unfortunately, he developed some postop confusion on the evening of day one requiring Ativan PRN.   POD 2 - He was noted to have spiked a temp that night before and into the morning of day two.  Checked CXR and UA. Continued to work with therapy into day two.  Dressing was changed on day two and the incision was healing well.  No signs of infection with the knee surgery. Urinalysis was negative.  CXR - IMPRESSION: Subsegmental atelectasis in the right infrahilar region and at the left lung base. No alveolar pneumonia or CHF. Thoracic aortic atherosclerosis.   His temp improved and the confusion resolved. POD 3 - By day three, the patient had progressed with therapy and meeting their goals.  Incision was healing well.   Patient was seen in rounds by Dr. Wynelle Link and was ready to go home.  Discharge home - No HHPT -In-Home VERA Therapy System Diet - Cardiac diet Follow up - in 2 weeks Activity - WBAT Disposition -  Home Condition Upon Discharge - Stable D/C Meds - See DC Summary DVT Prophylaxis - Xarelto  Discharge Instructions    Call MD / Call 911    Complete by:  As directed    If you experience chest pain or shortness of breath, CALL 911 and be transported to the hospital emergency room.  If you develope a fever above 101 F, pus (white drainage) or increased drainage or redness at the wound, or calf pain, call your surgeon's office.   Change dressing    Complete by:  As directed    Change dressing daily with sterile 4 x 4 inch gauze dressing and apply TED hose. Do not submerge the incision under water.   Constipation Prevention    Complete by:  As directed    Drink plenty of fluids.  Prune juice may be helpful.  You may use a stool softener, such as Colace (over the counter) 100 mg twice a day.  Use MiraLax (over the counter) for constipation as needed.   Diet - low sodium heart healthy    Complete by:  As directed    Discharge instructions    Complete by:  As directed    Take Xarelto for two and a half more weeks, then discontinue Xarelto. Once the patient has completed the blood thinner regimen, then take a Baby 81 mg Aspirin daily for three more weeks.   Pick up stool softner and laxative for home use following surgery while on pain medications. Do not submerge incision under water. Please use good hand washing techniques while changing dressing each day. May shower starting three days after surgery. Please use a clean towel to pat the incision dry following showers. Continue to use ice for pain and swelling after surgery. Do not use any lotions or creams on the incision until instructed by your surgeon.  Wear both TED hose on both legs during the day every day for three weeks, but may remove the TED hose at night at home.  Postoperative Constipation Protocol  Constipation - defined medically as fewer than three stools per week and severe constipation as less than one stool per  week.  One of the most common issues patients have following surgery is constipation.  Even if you have a regular bowel pattern at home, your normal regimen is likely to be disrupted due to multiple reasons following surgery.  Combination of anesthesia, postoperative narcotics, change in appetite and fluid intake all can affect your bowels.  In order to avoid complications following surgery, here are some recommendations in order to help you during your recovery period.  Colace (docusate) - Pick up an over-the-counter form of Colace or another stool softener and take twice a day as long as you are requiring postoperative pain medications.  Take with a full glass of water daily.  If you experience loose stools or diarrhea, hold the colace until you stool forms back up.  If your symptoms do not get better within 1 week or if they get worse, check with your doctor.  Dulcolax (bisacodyl) - Pick up over-the-counter and take as directed by the product packaging as needed to assist with the movement of your bowels.  Take with a full glass of water.  Use this product as needed if not relieved by Colace only.   MiraLax (polyethylene glycol) - Pick up over-the-counter to have on hand.  MiraLax is a solution that will increase the amount of water in your bowels to assist with bowel movements.  Take as directed and can mix with a glass of water, juice, soda, coffee, or tea.  Take if you go more than two days without a movement. Do not use MiraLax more than once per day. Call your doctor if you are still constipated or irregular after using this medication for 7 days in a row.  If you continue to have problems with postoperative constipation, please contact the office for further assistance and recommendations.  If you experience "the worst abdominal pain ever" or develop nausea or vomiting, please contact the office immediatly for further recommendations for treatment.   Do not put a pillow under the knee. Place it  under the heel.    Complete by:  As directed    Do not sit on  low chairs, stoools or toilet seats, as it may be difficult to get up from low surfaces    Complete by:  As directed    Driving restrictions    Complete by:  As directed    No driving until released by the physician.   Increase activity slowly as tolerated    Complete by:  As directed    Lifting restrictions    Complete by:  As directed    No lifting until released by the physician.   Patient may shower    Complete by:  As directed    You may shower without a dressing once there is no drainage.  Do not wash over the wound.  If drainage remains, do not shower until drainage stops.   TED hose    Complete by:  As directed    Use stockings (TED hose) for 3 weeks on both leg(s).  You may remove them at night for sleeping.   Weight bearing as tolerated    Complete by:  As directed    Laterality:  right   Extremity:  Lower     Allergies as of 09/28/2016      Reactions   Celebrex [celecoxib] Other (See Comments)   "ran my b/p up high, almost had a stroke blood pressure   Ivp Dye [iodinated Diagnostic Agents] Other (See Comments)   "started burning like fire" burning   Bextra [valdecoxib] Other (See Comments)   Pt passed out Increase blood pressure   Chlorpromazine    Pt states he thinks he becomes dizzy at times, but hard to recall at this time   Ciprofloxacin    Unknown   Cyclopentolate Other (See Comments)   Pt is not to use eye drops due to adverse reactions   Flomax [tamsulosin Hcl] Other (See Comments)   Blurred vision   Hyoscyamine Other (See Comments)   "I pass out"   Imuran [azathioprine] Other (See Comments)   Patient unable to provide any additional info.  Not sure of this drug at all even after reviewing possible uses. Passed out   Methotrexate Other (See Comments)   Caused anemia   Methotrexate Derivatives    Killed red blood cells, zombie like, killed immune system    Nasal Spray Other (See Comments)    "Blurred vision"   Penicillins Other (See Comments)   Problems with urination and GI problems      Medication List    STOP taking these medications   multivitamin capsule   NON FORMULARY   NON FORMULARY   SAW PALMETTO BERRIES PO     TAKE these medications   acetaminophen 500 MG tablet Commonly known as:  TYLENOL Take 1,000 mg by mouth every 6 (six) hours as needed for mild pain or moderate pain.   amLODipine 5 MG tablet Commonly known as:  NORVASC Take 5 mg by mouth at bedtime.   atenolol 50 MG tablet Commonly known as:  TENORMIN Take 25 mg by mouth 2 (two) times daily.   brimonidine 0.2 % ophthalmic solution Commonly known as:  ALPHAGAN Place 1 drop into both eyes 2 (two) times daily.   dorzolamide-timolol 22.3-6.8 MG/ML ophthalmic solution Commonly known as:  COSOPT Place 1 drop into both eyes 2 (two) times daily.   doxazosin 8 MG tablet Commonly known as:  CARDURA Take 8 mg by mouth at bedtime.   finasteride 5 MG tablet Commonly known as:  PROSCAR Take 5 mg by mouth at bedtime.   losartan-hydrochlorothiazide 100-25 MG  tablet Commonly known as:  HYZAAR Take 1 tablet by mouth daily. If BP is below 139 will not take med. If BP is 140 - takes med   prednisoLONE acetate 1 % ophthalmic suspension Commonly known as:  PRED FORTE Place 1 drop into both eyes daily.   rivaroxaban 10 MG Tabs tablet Commonly known as:  XARELTO Take 1 tablet (10 mg total) by mouth daily with breakfast. Take Xarelto for two and a half more weeks following discharge from the hospital, then discontinue Xarelto. Once the patient has completed the blood thinner regimen, then take a Baby 81 mg Aspirin daily for three more weeks.   traMADol 50 MG tablet Commonly known as:  ULTRAM Take 1-2 tablets (50-100 mg total) by mouth every 6 (six) hours as needed for moderate pain.            Durable Medical Equipment        Start     Ordered   09/26/16 1113  For home use only DME 3 n 1   Once     09/26/16 1112     Follow-up [facility] Follow up.   Why:  bedside commode Contact information: [address] [place] [address] [phone]        Gaynelle Arabian, MD. Schedule an appointment as soon as possible for a visit on 10/09/2016.   Specialty:  Orthopedic Surgery Why:  Follow up on Monday or Tuesday (8/13 or 8/14) with either [facility] or [employer] [facility] for first postop visit. Contact information: [address] [place] [address] [phone]           Signed: Arlee Muslim, PA-C Orthopaedic Surgery 09/28/2016, 8:23 AM

## 2016-09-28 NOTE — Progress Notes (Signed)
   Subjective: 3 Days Post-Op Procedure(s) (LRB): RIGHT TOTAL KNEE ARTHROPLASTY (Right) Patient reports pain as mild.   Patient seen in rounds for Dr. Lequita HaltAluisio. Patient is well, and has had no acute complaints or problems.  Patient doing better today. Patient is ready to go home  Objective: Vital signs in last 24 hours: Temp:  [98.2 F (36.8 C)-99.7 F (37.6 C)] 99.7 F (37.6 C) (08/02 0612) Pulse Rate:  [68-69] 68 (08/02 0612) Resp:  [15-16] 16 (08/02 0612) BP: (138-158)/(50-60) 142/50 (08/02 0612) SpO2:  [96 %-100 %] 96 % (08/02 0612)  Intake/Output from previous day:  Intake/Output Summary (Last 24 hours) at 09/28/16 0807 Last data filed at 09/28/16 0710  Gross per 24 hour  Intake              600 ml  Output             2275 ml  Net            -1675 ml    Intake/Output this shift: Total I/O In: -  Out: 150 [Urine:150]  Labs:  Recent Labs  09/26/16 0557 09/27/16 0620 09/28/16 0535  HGB 11.8* 10.2* 9.5*    Recent Labs  09/27/16 0620 09/28/16 0535  WBC 8.8 9.1  RBC 3.33* 3.02*  HCT 28.9* 26.7*  PLT 131* 125*    Recent Labs  09/27/16 0620 09/28/16 0535  NA 138 136  K 3.2* 3.3*  CL 105 105  CO2 24 24  BUN 15 13  CREATININE 1.13 0.99  GLUCOSE 108* 104*  CALCIUM 8.4* 8.2*   No results for input(s): LABPT, INR in the last 72 hours.  EXAM: General - Patient is Alert, Appropriate and Oriented Extremity - Neurovascular intact Sensation intact distally Intact pulses distally Dorsiflexion/Plantar flexion intact Incision - clean, dry Motor Function - intact, moving foot and toes well on exam.   Assessment/Plan: 3 Days Post-Op Procedure(s) (LRB): RIGHT TOTAL KNEE ARTHROPLASTY (Right) Procedure(s) (LRB): RIGHT TOTAL KNEE ARTHROPLASTY (Right) Past Medical History:  Diagnosis Date  . Anemia   . Arthritis   . Complication of anesthesia    HARD TO WAKE UP  . Constipation   . DJD (degenerative joint disease)   . Glaucoma   . Headache    hx of    . Heart murmur   . History of kidney stones   . HTN (hypertension)   . Otitis media    with intermittent vertigo  . Pneumonia   . PONV (postoperative nausea and vomiting)   . Weakness    of neck muscles   Active Problems:   OA (osteoarthritis) of knee  Estimated body mass index is 26.87 kg/m as calculated from the following:   Height as of this encounter: 6\' 3"  (1.905 m).   Weight as of this encounter: 97.5 kg (215 lb).  Discharge home - No HHPT - In-Home VERA Therapy System Diet - Cardiac diet Follow up - in 2 weeks Activity - WBAT Disposition - Home Condition Upon Discharge - Stable D/C Meds - See DC Summary DVT Prophylaxis - Xarelto  Avel Peacerew Perkins, PA-C Orthopaedic Surgery 09/28/2016, 8:07 AM

## 2016-09-28 NOTE — Progress Notes (Signed)
OT  Note  Patient Details Name: Dave AskewBobby D [name] MRN: [id number] DOB: [birth year]   Spoke with pts daugther - no questions regarding ADL activity including dressing, bathing and toilet and shower transfer  [name], Samuella Rasool D  Lori Kourtnee Lahey, [place] [phone] 09/28/2016, 10:12 AM

## 2016-10-12 NOTE — Discharge Summary (Signed)
Discharge Summary  Patient ID:  [name]  MRN: [id number]  DOB/AGE: August 05, 1935 81 y.o.  Admit date: 09/25/2016  Discharge date: 09/28/2016  Primary Diagnosis: Osteoarthritis Right knee(s)  Admission Diagnoses:      Past Medical History:  Diagnosis Date  . Anemia   . Arthritis   . Complication of anesthesia    HARD TO WAKE UP  . Constipation   . DJD (degenerative joint disease)   . Glaucoma   . Headache    hx of  . Heart murmur   . History of kidney stones   . HTN (hypertension)   . Otitis media    with intermittent vertigo  . Pneumonia   . PONV (postoperative nausea and vomiting)   . Weakness    of neck muscles   Discharge Diagnoses:  Active Problems:  OA (osteoarthritis) of knee   Estimated body mass index is 26.87 kg/m as calculated from the following:  Height as of this encounter: 6' 3"  (1.905 m).  Weight as of this encounter: 97.5 kg (215 lb).  Procedure:  Procedure(s) (LRB):  RIGHT TOTAL KNEE ARTHROPLASTY (Right)  Consults: None  HPI: [name] is a 81 y.o. year old male with end stage OA of his right knee with progressively worsening pain and dysfunction. He has constant pain, with activity and at rest and significant functional deficits with difficulties even with ADLs. He has had extensive non-op management including analgesics, injections of cortisone and viscosupplements, and home exercise program, but remains in significant pain with significant dysfunction. Radiographs show bone on bone arthritis lateral and patellofemoral with valgus deformity. He presents now for right Total Knee Arthroplasty.  Laboratory Data:         Admission on 09/25/2016  Component Date Value Ref Range Status  . WBC 09/26/2016 9.6  4.0 - 10.5 K/uL Final  . RBC 09/26/2016 3.75* 4.22 - 5.81 MIL/uL Final  . Hemoglobin 09/26/2016 11.8* 13.0 - 17.0 g/dL Final  . HCT 09/26/2016 32.3* 39.0 - 52.0 % Final  . MCV 09/26/2016 86.1  78.0 - 100.0 fL Final  . MCH 09/26/2016 31.5  26.0  - 34.0 pg Final  . MCHC 09/26/2016 36.5* 30.0 - 36.0 g/dL Final  . RDW 09/26/2016 13.7  11.5 - 15.5 % Final  . Platelets 09/26/2016 128* 150 - 400 K/uL Final  . Sodium 09/26/2016 138  135 - 145 mmol/L Final  . Potassium 09/26/2016 4.1  3.5 - 5.1 mmol/L Final  . Chloride 09/26/2016 104  101 - 111 mmol/L Final  . CO2 09/26/2016 26  22 - 32 mmol/L Final  . Glucose, Bld 09/26/2016 140* 65 - 99 mg/dL Final  . BUN 09/26/2016 15  6 - 20 mg/dL Final  . Creatinine, Ser 09/26/2016 1.13  0.61 - 1.24 mg/dL Final  . Calcium 09/26/2016 8.7* 8.9 - 10.3 mg/dL Final  . GFR calc non Af Amer 09/26/2016 60* >60 mL/min Final  . GFR calc Af Amer 09/26/2016 >60  >60 mL/min Final   Comment: (NOTE)  The eGFR has been calculated using the CKD EPI equation.  This calculation has not been validated in all clinical situations.  eGFR's persistently <60 mL/min signify possible Chronic Kidney  Disease.   . Anion gap 09/26/2016 8  5 - 15 Final  . WBC 09/27/2016 8.8  4.0 - 10.5 K/uL Final  . RBC 09/27/2016 3.33* 4.22 - 5.81 MIL/uL Final  . Hemoglobin 09/27/2016 10.2* 13.0 - 17.0 g/dL Final  . HCT 09/27/2016 28.9* 39.0 -  52.0 % Final  . MCV 09/27/2016 86.8  78.0 - 100.0 fL Final  . MCH 09/27/2016 30.6  26.0 - 34.0 pg Final  . MCHC 09/27/2016 35.3  30.0 - 36.0 g/dL Final  . RDW 09/27/2016 13.8  11.5 - 15.5 % Final  . Platelets 09/27/2016 131* 150 - 400 K/uL Final  . Sodium 09/27/2016 138  135 - 145 mmol/L Final  . Potassium 09/27/2016 3.2* 3.5 - 5.1 mmol/L Final   Comment: DELTA CHECK NOTED  REPEATED TO VERIFY   . Chloride 09/27/2016 105  101 - 111 mmol/L Final  . CO2 09/27/2016 24  22 - 32 mmol/L Final  . Glucose, Bld 09/27/2016 108* 65 - 99 mg/dL Final  . BUN 09/27/2016 15  6 - 20 mg/dL Final  . Creatinine, Ser 09/27/2016 1.13  0.61 - 1.24 mg/dL Final  . Calcium 09/27/2016 8.4* 8.9 - 10.3 mg/dL Final  . GFR calc non Af Amer 09/27/2016 60* >60 mL/min Final  . GFR calc Af Amer 09/27/2016 >60  >60 mL/min Final    Comment: (NOTE)  The eGFR has been calculated using the CKD EPI equation.  This calculation has not been validated in all clinical situations.  eGFR's persistently <60 mL/min signify possible Chronic Kidney  Disease.   . Anion gap 09/27/2016 9  5 - 15 Final  . Color, Urine 09/27/2016 YELLOW  YELLOW Final  . APPearance 09/27/2016 CLEAR  CLEAR Final  . Specific Gravity, Urine 09/27/2016 1.014  1.005 - 1.030 Final  . pH 09/27/2016 6.0  5.0 - 8.0 Final  . Glucose, UA 09/27/2016 NEGATIVE  NEGATIVE mg/dL Final  . Hgb urine dipstick 09/27/2016 NEGATIVE  NEGATIVE Final  . Bilirubin Urine 09/27/2016 NEGATIVE  NEGATIVE Final  . Ketones, ur 09/27/2016 5* NEGATIVE mg/dL Final  . Protein, ur 09/27/2016 NEGATIVE  NEGATIVE mg/dL Final  . Nitrite 09/27/2016 NEGATIVE  NEGATIVE Final  . Leukocytes, UA 09/27/2016 NEGATIVE  NEGATIVE Final  . WBC 09/28/2016 9.1  4.0 - 10.5 K/uL Final  . RBC 09/28/2016 3.02* 4.22 - 5.81 MIL/uL Final  . Hemoglobin 09/28/2016 9.5* 13.0 - 17.0 g/dL Final  . HCT 09/28/2016 26.7* 39.0 - 52.0 % Final  . MCV 09/28/2016 88.4  78.0 - 100.0 fL Final  . MCH 09/28/2016 31.5  26.0 - 34.0 pg Final  . MCHC 09/28/2016 35.6  30.0 - 36.0 g/dL Final  . RDW 09/28/2016 14.0  11.5 - 15.5 % Final  . Platelets 09/28/2016 125* 150 - 400 K/uL Final  . Sodium 09/28/2016 136  135 - 145 mmol/L Final  . Potassium 09/28/2016 3.3* 3.5 - 5.1 mmol/L Final  . Chloride 09/28/2016 105  101 - 111 mmol/L Final  . CO2 09/28/2016 24  22 - 32 mmol/L Final  . Glucose, Bld 09/28/2016 104* 65 - 99 mg/dL Final  . BUN 09/28/2016 13  6 - 20 mg/dL Final  . Creatinine, Ser 09/28/2016 0.99  0.61 - 1.24 mg/dL Final  . Calcium 09/28/2016 8.2* 8.9 - 10.3 mg/dL Final  . GFR calc non Af Amer 09/28/2016 >60  >60 mL/min Final  . GFR calc Af Amer 09/28/2016 >60  >60 mL/min Final   Comment: (NOTE)  The eGFR has been calculated using the CKD EPI equation.  This calculation has not been validated in all clinical situations.    eGFR's persistently <60 mL/min signify possible Chronic Kidney  Disease.   Georgiann Hahn gap 09/28/2016 7  5 - 15 Final  Hospital Outpatient Visit on 09/18/2016  Component Date  Value Ref Range Status  . aPTT 09/18/2016 35  24 - 36 seconds Final  . WBC 09/18/2016 3.9* 4.0 - 10.5 K/uL Final  . RBC 09/18/2016 3.93* 4.22 - 5.81 MIL/uL Final  . Hemoglobin 09/18/2016 12.2* 13.0 - 17.0 g/dL Final  . HCT 09/18/2016 34.9* 39.0 - 52.0 % Final  . MCV 09/18/2016 88.8  78.0 - 100.0 fL Final  . MCH 09/18/2016 31.0  26.0 - 34.0 pg Final  . MCHC 09/18/2016 35.0  30.0 - 36.0 g/dL Final  . RDW 09/18/2016 13.7  11.5 - 15.5 % Final  . Platelets 09/18/2016 170  150 - 400 K/uL Final  . Sodium 09/18/2016 140  135 - 145 mmol/L Final  . Potassium 09/18/2016 3.8  3.5 - 5.1 mmol/L Final  . Chloride 09/18/2016 107  101 - 111 mmol/L Final  . CO2 09/18/2016 28  22 - 32 mmol/L Final  . Glucose, Bld 09/18/2016 87  65 - 99 mg/dL Final  . BUN 09/18/2016 12  6 - 20 mg/dL Final  . Creatinine, Ser 09/18/2016 1.08  0.61 - 1.24 mg/dL Final  . Calcium 09/18/2016 9.1  8.9 - 10.3 mg/dL Final  . Total Protein 09/18/2016 7.3  6.5 - 8.1 g/dL Final  . Albumin 09/18/2016 3.8  3.5 - 5.0 g/dL Final  . AST 09/18/2016 29  15 - 41 U/L Final  . ALT 09/18/2016 19  17 - 63 U/L Final  . Alkaline Phosphatase 09/18/2016 67  38 - 126 U/L Final  . Total Bilirubin 09/18/2016 0.7  0.3 - 1.2 mg/dL Final  . GFR calc non Af Amer 09/18/2016 >60  >60 mL/min Final  . GFR calc Af Amer 09/18/2016 >60  >60 mL/min Final   Comment: (NOTE)  The eGFR has been calculated using the CKD EPI equation.  This calculation has not been validated in all clinical situations.  eGFR's persistently <60 mL/min signify possible Chronic Kidney  Disease.   . Anion gap 09/18/2016 5  5 - 15 Final  . Prothrombin Time 09/18/2016 14.9  11.4 - 15.2 seconds Final  . INR 09/18/2016 1.17   Final  . ABO/RH(D) 09/18/2016 B POS   Final  . Antibody Screen 09/18/2016 NEG   Final  .  Sample Expiration 09/18/2016 09/28/2016   Final  . Extend sample reason 09/18/2016 NO TRANSFUSIONS OR PREGNANCY IN THE PAST 3 MONTHS   Final  . MRSA, PCR 09/18/2016 NEGATIVE  NEGATIVE Final  . Staphylococcus aureus 09/18/2016 NEGATIVE  NEGATIVE Final   Comment:  The Xpert SA Assay (FDA  approved for NASAL specimens  in patients over 57 years of age),  is one component of  a comprehensive surveillance  program. Test performance has  been validated by [facility] for patients greater  than or equal to 58 year old.  It is not intended  to diagnose infection nor to  guide or monitor treatment.    X-Rays:  Imaging Results     EKG:     Orders placed or performed during the hospital encounter of 09/18/16  . EKG 12 lead  . EKG 12 lead   Hospital Course: [name] is a 81 y.o. who was admitted to [facility]. They were brought to the operating room on 09/25/2016 and underwent Procedure(s):  RIGHT TOTAL KNEE ARTHROPLASTY. Patient tolerated the procedure well and was later transferred to the recovery room and then to the orthopaedic floor for postoperative care. They were given PO and IV analgesics for pain control  following their surgery. They were given 24 hours of postoperative antibiotics of            Anti-infectives     Start   Dose/Rate Route Frequency Ordered Stop   09/25/16 1300  ceFAZolin (ANCEF) IVPB 2g/100 mL premix  2 g  200 mL/hr over 30 Minutes Intravenous Every 6 hours 09/25/16 1025 09/25/16 1830   09/25/16 0520  ceFAZolin (ANCEF) 2-4 GM/100ML-% IVPB     09/25/16 0520 09/25/16 0715     Comments: [facility] Session : cabinet override        09/25/16 0517  ceFAZolin (ANCEF) IVPB 2g/100 mL premix  2 g  200 mL/hr over 30 Minutes Intravenous On call to O.R. 09/25/16 [id number] 09/25/16 0725     and started on DVT prophylaxis in the form of Xarelto. PT and OT were ordered for total joint protocol. Discharge planning consulted to help with postop disposition and equipment  needs. Patient had a decent night on the evening of surgery and got up and walked about 20 feet in the room the day of surgery.  POD 1 - They started to get up OOB with therapy on day one. Hemovac drain was pulled without difficulty. Unfortunately, he developed some postop confusion on the evening of day one requiring Ativan PRN.  POD 2 - He was noted to have spiked a temp that night before and into the morning of day two. Checked CXR and UA. Continued to work with therapy into day two. Dressing was changed on day two and the incision was healing well. No signs of infection with the knee surgery.  Urinalysis was negative. CXR - IMPRESSION: Subsegmental atelectasis in the right infrahilar region and at the  left lung base. No alveolar pneumonia or CHF. Thoracic aortic atherosclerosis.  His temp improved and the confusion resolved.  POD 3 - By day three, the patient had progressed with therapy and meeting their goals. Incision was healing well.  Patient was seen in rounds by Dr. Wynelle Link and was ready to go home.  Discharge home - No HHPT - In-Home [facility]  Diet - Cardiac diet  Follow up - in 2 weeks  Activity - WBAT  Disposition - Home  Condition Upon Discharge - Stable  D/C Meds - See DC Summary  DVT Prophylaxis - Xarelto      Discharge Instructions     Call MD / Call 911  Complete by: As directed    If you experience chest pain or shortness of breath, CALL 911 and be transported to the hospital emergency room. If you develope a fever above 101 F, pus (white drainage) or increased drainage or redness at the wound, or calf pain, call your surgeon's office.   Change dressing  Complete by: As directed    Change dressing daily with sterile 4 x 4 inch gauze dressing and apply TED hose. Do not submerge the incision under water.   Constipation Prevention  Complete by: As directed    Drink plenty of fluids. Prune juice may be helpful. You may use a stool softener, such as Colace (over the  counter) 100 mg twice a day. Use MiraLax (over the counter) for constipation as needed.   Diet - low sodium heart healthy  Complete by: As directed    Discharge instructions  Complete by: As directed    Take Xarelto for two and a half more weeks, then discontinue Xarelto.  Once the patient has completed the blood thinner regimen, then take a  Baby 81 mg Aspirin daily for three more weeks.  Pick up stool softner and laxative for home use following surgery while on pain medications.  Do not submerge incision under water.  Please use good hand washing techniques while changing dressing each day.  May shower starting three days after surgery.  Please use a clean towel to pat the incision dry following showers.  Continue to use ice for pain and swelling after surgery.  Do not use any lotions or creams on the incision until instructed by your surgeon.  Wear both TED hose on both legs during the day every day for three weeks, but may remove the TED hose at night at home.  Postoperative Constipation Protocol  Constipation - defined medically as fewer than three stools per week and severe constipation as less than one stool per week.  One of the most common issues patients have following surgery is constipation. Even if you have a regular bowel pattern at home, your normal regimen is likely to be disrupted due to multiple reasons following surgery. Combination of anesthesia, postoperative narcotics, change in appetite and fluid intake all can affect your bowels. In order to avoid complications following surgery, here are some recommendations in order to help you during your recovery period.  Colace (docusate) - Pick up an over-the-counter form of Colace or another stool softener and take twice a day as long as you are requiring postoperative pain medications. Take with a full glass of water daily. If you experience loose stools or diarrhea, hold the colace until you stool forms back up. If your symptoms do not  get better within 1 week or if they get worse, check with your doctor.  Dulcolax (bisacodyl) - Pick up over-the-counter and take as directed by the product packaging as needed to assist with the movement of your bowels. Take with a full glass of water. Use this product as needed if not relieved by Colace only.  MiraLax (polyethylene glycol) - Pick up over-the-counter to have on hand. MiraLax is a solution that will increase the amount of water in your bowels to assist with bowel movements. Take as directed and can mix with a glass of water, juice, soda, coffee, or tea. Take if you go more than two days without a movement.  Do not use MiraLax more than once per day. Call your doctor if you are still constipated or irregular after using this medication for 7 days in a row.  If you continue to have problems with postoperative constipation, please contact the office for further assistance and recommendations. If you experience "the worst abdominal pain ever" or develop nausea or vomiting, please contact the office immediatly for further recommendations for treatment.   Do not put a pillow under the knee. Place it under the heel.  Complete by: As directed    Do not sit on low chairs, stoools or toilet seats, as it may be difficult to get up from low surfaces  Complete by: As directed    Driving restrictions  Complete by: As directed    No driving until released by the physician.   Increase activity slowly as tolerated  Complete by: As directed    Lifting restrictions  Complete by: As directed    No lifting until released by the physician.   Patient may shower  Complete by: As directed    You may shower without a dressing once there is no drainage. Do not wash over the wound. If drainage remains, do not shower  until drainage stops.   TED hose  Complete by: As directed    Use stockings (TED hose) for 3 weeks on both leg(s). You may remove them at night for sleeping.   Weight bearing as tolerated  Complete  by: As directed    Laterality: right   Extremity: Lower          Allergies as of 09/28/2016      Reactions   Celebrex [celecoxib] Other (See Comments)   "ran my b/p up high, almost had a stroke  blood pressure   Ivp Dye [iodinated Diagnostic Agents] Other (See Comments)   "started burning like fire"  burning   Bextra [valdecoxib] Other (See Comments)   Pt passed out  Increase blood pressure   Chlorpromazine    Pt states he thinks he becomes dizzy at times, but hard to recall at this time   Ciprofloxacin    Unknown   Cyclopentolate Other (See Comments)   Pt is not to use eye drops due to adverse reactions   Flomax [tamsulosin Hcl] Other (See Comments)   Blurred vision   Hyoscyamine Other (See Comments)   "I pass out"   Imuran [azathioprine] Other (See Comments)   Patient unable to provide any additional info. Not sure of this drug at all even after reviewing possible uses.  Passed out   Methotrexate Other (See Comments)   Caused anemia   Methotrexate Derivatives    Killed red blood cells, zombie like, killed immune system    Nasal Spray Other (See Comments)   "Blurred vision"   Penicillins Other (See Comments)   Problems with urination and GI problems                             Medication List                STOP taking these medications             multivitamin capsule    NON FORMULARY    NON FORMULARY    SAW PALMETTO BERRIES PO                         TAKE these medications             acetaminophen 500 MG tablet  Commonly known as: TYLENOL  Take 1,000 mg by mouth every 6 (six) hours as needed for mild pain or moderate pain.    amLODipine 5 MG tablet  Commonly known as: NORVASC  Take 5 mg by mouth at bedtime.    atenolol 50 MG tablet  Commonly known as: TENORMIN  Take 25 mg by mouth 2 (two) times daily.    brimonidine 0.2 % ophthalmic solution  Commonly known as: ALPHAGAN  Place 1 drop into both eyes 2 (two) times daily.      dorzolamide-timolol 22.3-6.8 MG/ML ophthalmic solution  Commonly known as: COSOPT  Place 1 drop into both eyes 2 (two) times daily.    doxazosin 8 MG tablet  Commonly known as: CARDURA  Take 8 mg by mouth at bedtime.    finasteride 5 MG tablet  Commonly known as: PROSCAR  Take 5 mg by mouth at bedtime.    losartan-hydrochlorothiazide 100-25 MG tablet  Commonly known as: HYZAAR  Take 1 tablet by mouth daily. If BP is below 139 will not take med. If BP is 140 - takes med    prednisoLONE acetate  1 % ophthalmic suspension  Commonly known as: PRED FORTE  Place 1 drop into both eyes daily.    rivaroxaban 10 MG Tabs tablet  Commonly known as: XARELTO  Take 1 tablet (10 mg total) by mouth daily with breakfast. Take Xarelto for two and a half more weeks following discharge from the hospital, then discontinue Xarelto. Once the patient has completed the blood thinner regimen, then take a Baby 81 mg Aspirin daily for three more weeks.    traMADol 50 MG tablet  Commonly known as: ULTRAM  Take 1-2 tablets (50-100 mg total) by mouth every 6 (six) hours as needed for moderate pain.                                           Durable Medical Equipment               Start   Ordered   09/26/16 1113  For home use only DME 3 n 1 Once  09/26/16 1112         Follow-up [facility] Follow up.    Why: bedside commode  Contact information:  [address]  [place] [address]  [phone]        Gaynelle Arabian, MD. Schedule an appointment as soon as possible for a visit on 10/09/2016.    Specialty: Orthopedic Surgery  Why: Follow up on Monday or Tuesday (8/13 or 8/14) with either [facility] or [employer] [facility] for first postop visit.  Contact information:  [address]  [place] [address]  [phone]          Signed:  Arlee Muslim, PA-C  Orthopaedic Surgery  09/28/2016, 8:23 AM

## 2017-01-26 ENCOUNTER — Other Ambulatory Visit: Payer: Self-pay | Admitting: Internal Medicine

## 2017-01-26 DIAGNOSIS — R7989 Other specified abnormal findings of blood chemistry: Secondary | ICD-10-CM

## 2017-01-26 DIAGNOSIS — M7989 Other specified soft tissue disorders: Secondary | ICD-10-CM

## 2017-01-29 ENCOUNTER — Ambulatory Visit
Admission: RE | Admit: 2017-01-29 | Discharge: 2017-01-29 | Disposition: A | Discharge status: Home / Self Care | Payer: Medicare Other | Source: Ambulatory Visit | Attending: Internal Medicine | Admitting: Internal Medicine

## 2017-01-29 DIAGNOSIS — R7989 Other specified abnormal findings of blood chemistry: Secondary | ICD-10-CM

## 2017-01-29 DIAGNOSIS — M7989 Other specified soft tissue disorders: Secondary | ICD-10-CM

## 2017-02-10 ENCOUNTER — Emergency Department (HOSPITAL_BASED_OUTPATIENT_CLINIC_OR_DEPARTMENT_OTHER): Payer: Medicare Other

## 2017-02-10 ENCOUNTER — Encounter (HOSPITAL_BASED_OUTPATIENT_CLINIC_OR_DEPARTMENT_OTHER): Payer: Self-pay | Admitting: *Deleted

## 2017-02-10 ENCOUNTER — Emergency Department (HOSPITAL_BASED_OUTPATIENT_CLINIC_OR_DEPARTMENT_OTHER)
Admission: EM | Admit: 2017-02-10 | Discharge: 2017-02-10 | Disposition: A | Discharge status: Home / Self Care | Payer: Medicare Other | Attending: Emergency Medicine | Admitting: Emergency Medicine

## 2017-02-10 DIAGNOSIS — I10 Essential (primary) hypertension: Secondary | ICD-10-CM | POA: Diagnosis not present

## 2017-02-10 DIAGNOSIS — Z96653 Presence of artificial knee joint, bilateral: Secondary | ICD-10-CM | POA: Insufficient documentation

## 2017-02-10 DIAGNOSIS — Z7901 Long term (current) use of anticoagulants: Secondary | ICD-10-CM | POA: Insufficient documentation

## 2017-02-10 DIAGNOSIS — R590 Localized enlarged lymph nodes: Secondary | ICD-10-CM | POA: Diagnosis not present

## 2017-02-10 DIAGNOSIS — J029 Acute pharyngitis, unspecified: Secondary | ICD-10-CM

## 2017-02-10 DIAGNOSIS — R0981 Nasal congestion: Secondary | ICD-10-CM | POA: Diagnosis present

## 2017-02-10 DIAGNOSIS — J3489 Other specified disorders of nose and nasal sinuses: Secondary | ICD-10-CM | POA: Insufficient documentation

## 2017-02-10 NOTE — Discharge Instructions (Signed)
Try ocean nasal spray or saline spray.  Follow up with your doctor next week if not improved

## 2017-02-10 NOTE — ED Triage Notes (Signed)
Pt reports URI with nasal congestion x 3-4 days. Reports soreness under his chin. Denies cough.
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
## 2017-04-12 DIAGNOSIS — Z96651 Presence of right artificial knee joint: Secondary | ICD-10-CM | POA: Insufficient documentation

## 2017-07-30 ENCOUNTER — Other Ambulatory Visit: Payer: Self-pay

## 2017-07-30 ENCOUNTER — Emergency Department (HOSPITAL_BASED_OUTPATIENT_CLINIC_OR_DEPARTMENT_OTHER)
Admission: EM | Admit: 2017-07-30 | Discharge: 2017-07-30 | Disposition: A | Discharge status: Home / Self Care | Payer: Medicare Other | Attending: Emergency Medicine | Admitting: Emergency Medicine

## 2017-07-30 ENCOUNTER — Encounter (HOSPITAL_BASED_OUTPATIENT_CLINIC_OR_DEPARTMENT_OTHER): Payer: Self-pay | Admitting: Emergency Medicine

## 2017-07-30 DIAGNOSIS — R222 Localized swelling, mass and lump, trunk: Secondary | ICD-10-CM | POA: Diagnosis present

## 2017-07-30 DIAGNOSIS — L089 Local infection of the skin and subcutaneous tissue, unspecified: Secondary | ICD-10-CM

## 2017-07-30 DIAGNOSIS — L02212 Cutaneous abscess of back [any part, except buttock]: Secondary | ICD-10-CM | POA: Insufficient documentation

## 2017-07-30 DIAGNOSIS — Z79899 Other long term (current) drug therapy: Secondary | ICD-10-CM | POA: Insufficient documentation

## 2017-07-30 DIAGNOSIS — L729 Follicular cyst of the skin and subcutaneous tissue, unspecified: Secondary | ICD-10-CM

## 2017-07-30 DIAGNOSIS — I10 Essential (primary) hypertension: Secondary | ICD-10-CM | POA: Insufficient documentation

## 2017-07-30 DIAGNOSIS — Z7901 Long term (current) use of anticoagulants: Secondary | ICD-10-CM | POA: Diagnosis not present

## 2017-07-30 MED ORDER — LIDOCAINE HCL (PF) 2 % IJ SOLN
INTRAMUSCULAR | Status: AC
  Administered 2017-07-30: 200 mg via INTRADERMAL
  Filled 2017-07-30: qty 2

## 2017-07-30 MED ORDER — LIDOCAINE HCL (PF) 2 % IJ SOLN
INTRAMUSCULAR | Status: AC
  Filled 2017-07-30: qty 2

## 2017-07-30 MED ORDER — LIDOCAINE HCL 2 % IJ SOLN
10.0000 mL | Freq: Once | INTRAMUSCULAR | Status: AC
  Administered 2017-07-30: 200 mg via INTRADERMAL
  Filled 2017-07-30: qty 10

## 2017-07-30 NOTE — ED Notes (Signed)
Pt is being examined at this time. I&D tray at bedside.
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
## 2017-07-30 NOTE — ED Notes (Signed)
Pt states he found a tick on his left groin recently.  Pt has several insect bites all over.

## 2017-07-30 NOTE — ED Triage Notes (Addendum)
Patient states that he has "cyst" to his back  - patient states that he noticed last Friday  - denies pain right now but hurts to touch  - patient mentions during the triage 2 - 3 other spots that may be similar  - he is unsure. Left groin, left neck possibly

## 2017-07-30 NOTE — Discharge Instructions (Addendum)
You had an infected cyst on your upper back which was drained today.  Please take the antibiotic prescribed by Dr. August Saucerean.  Your back will bleed and leak for a couple of days.  This is normal.  Please gently wash the area with warm soapy water in the shower daily and cover with gauze that we have given you to go home with.  Return to the ER if you have any new or concerning symptoms like new redness and swelling over your back, fever.

## 2017-08-22 ENCOUNTER — Ambulatory Visit (INDEPENDENT_AMBULATORY_CARE_PROVIDER_SITE_OTHER): Payer: Medicare Other | Admitting: Family Medicine

## 2017-08-22 ENCOUNTER — Other Ambulatory Visit: Payer: Self-pay

## 2017-08-22 ENCOUNTER — Encounter: Payer: Self-pay | Admitting: Family Medicine

## 2017-08-22 VITALS — BP 124/60 | HR 61 | Temp 98.2°F | Ht 73.0 in | Wt 203.0 lb

## 2017-08-22 DIAGNOSIS — M1990 Unspecified osteoarthritis, unspecified site: Secondary | ICD-10-CM

## 2017-08-22 DIAGNOSIS — L853 Xerosis cutis: Secondary | ICD-10-CM

## 2017-08-22 DIAGNOSIS — N400 Enlarged prostate without lower urinary tract symptoms: Secondary | ICD-10-CM | POA: Insufficient documentation

## 2017-08-22 DIAGNOSIS — Z23 Encounter for immunization: Secondary | ICD-10-CM | POA: Diagnosis not present

## 2017-08-22 DIAGNOSIS — H409 Unspecified glaucoma: Secondary | ICD-10-CM | POA: Diagnosis not present

## 2017-08-22 DIAGNOSIS — I10 Essential (primary) hypertension: Secondary | ICD-10-CM | POA: Diagnosis present

## 2017-08-22 MED ORDER — TETANUS-DIPHTH-ACELL PERTUSSIS 5-2.5-18.5 LF-MCG/0.5 IM SUSP: 0.5000 mL | Freq: Once | INTRAMUSCULAR | 0 refills | Status: AC

## 2017-08-22 NOTE — Patient Instructions (Addendum)
It was good to see you today!  For your dry skin, use vaseline lotion.  We will get records from your previous doctor.   Get your tetanus shot at your pharmacy.  We are checking some labs today. If results require attention, either myself or my nurse will get in touch with you. If everything is normal, you will get a letter in the mail or a message in My Chart. Please give us a call if you do not hear from us after 2 weeks.  Please check-out at the front desk before leaving the clinic. Make an appointment in  1-2 weeks to talk about your other concerns.   Please bring all of your medications with you to each visit.   Sign up for My Chart to have easy access to your labs results, and communication with your primary care physician.  Feel free to call with any questions or concerns at any time, at [phone].   Take care,  Dr. Leland HerElsia J Yoo, DO [facility]

## 2017-08-22 NOTE — Progress Notes (Signed)
Subjective:  [name] is a 82 y.o. male who presents to the [facility] today to establish care.  HPI:  Last saw PCP Dr Willey Blade 3 weeks ago, wants to switch. Sees Dr Sharyn Lull for cardiology Ophthalmology is Dr. Arville Go, Dr Lesia Sago, Dr. Lanetta Inch  Skin concerns Has some flaking on skin on his face and a mole on chest that he is concern about. No redness or swelling. Skin on face is mildly itchy. Top of his started hurting with mild pain this moring but think that is an unrelated headache. Uses vaseline lotion sometimes but not often.  Last health maintenance Colonoscopy 2018 - removed some polyps PSA 2018 Echo 2018 R leg Xray 2019 Jan   ROS: per HPI, otherwise all systems reviewed and negative  PMH:  The following were reviewed and entered/updated in epic: Past Medical History:  Diagnosis Date  . Anemia   . Arthritis   . Complication of anesthesia    HARD TO WAKE UP  . Constipation   . DJD (degenerative joint disease)   . Glaucoma   . Headache    hx of  . Heart murmur   . History of kidney stones   . HTN (hypertension)   . Otitis media    with intermittent vertigo  . Pneumonia   . PONV (postoperative nausea and vomiting)   . Weakness    of neck muscles   Patient Active Problem List   Diagnosis Date Noted  . BPH (benign prostatic hyperplasia) 08/22/2017  . Arthralgia, neck 05/20/2014  . Arthritis   . OA (osteoarthritis) of knee 08/05/2012  . Allergic rhinitis 03/29/2012  . Glaucoma 03/29/2012  . HTN (hypertension)   . DJD (degenerative joint disease)   . Ptosis of eyelid 04/18/2011   Past Surgical History:  Procedure Laterality Date  . BACK SURGERY  2000  . Carpal Tunnel Left    03/2014  . CARPAL TUNNELL  1970'S  . EYE SURGERY  06/10/2009 / 07/17/12   glaucoma   . TONSILLECTOMY    . TOTAL KNEE ARTHROPLASTY Left 08/05/2012   Procedure: LEFT TOTAL KNEE ARTHROPLASTY;  Surgeon: Loanne Drilling, MD;  Location: [facility];  Service: Orthopedics;   Laterality: Left;  . TOTAL KNEE ARTHROPLASTY Right 09/25/2016   Procedure: RIGHT TOTAL KNEE ARTHROPLASTY;  Surgeon: Ollen Gross, MD;  Location: [facility];  Service: Orthopedics;  Laterality: Right;    Family History  Problem Relation Age of Onset  . Arthritis Mother   . Glaucoma Mother   . Hypertension Mother   . GER disease Father   . Hypertension Father   . Diabetes Brother   . Hypertension Brother   . ALS Brother   . Cancer Brother        unknown  . Dementia Brother   . Diabetes Daughter   . Diabetes Daughter   . Diabetes Son   . Dementia Sister   . Cancer Sister        unknown  . Heart disease Brother   . Hypertension Brother   . Hyperlipidemia Sister     Medications- reviewed and updated Current Outpatient Medications  Medication Sig Dispense Refill  . acetaminophen (TYLENOL) 500 MG tablet Take 1,000 mg by mouth every 6 (six) hours as needed for mild pain or moderate pain.     Marland Kitchen atenolol (TENORMIN) 50 MG tablet Take 25 mg by mouth 2 (two) times daily.     . brimonidine (ALPHAGAN) 0.2 % ophthalmic solution Place 1  drop into both eyes 2 (two) times daily.     . dorzolamide-timolol (COSOPT) 22.3-6.8 MG/ML ophthalmic solution Place 1 drop into both eyes 2 (two) times daily.     Marland Kitchen doxazosin (CARDURA) 8 MG tablet Take 8 mg by mouth at bedtime.     . finasteride (PROSCAR) 5 MG tablet Take 5 mg by mouth at bedtime.     Marland Kitchen losartan-hydrochlorothiazide (HYZAAR) 100-25 MG per tablet Take 1 tablet by mouth daily. If BP is below 139 will not take med. If BP is 140 - takes med    . prednisoLONE acetate (PRED FORTE) 1 % ophthalmic suspension Place 1 drop into both eyes daily.    . Tdap (BOOSTRIX) 5-2.5-18.5 LF-MCG/0.5 injection Inject 0.5 mLs into the muscle once for 1 dose. 0.5 mL 0   No current facility-administered medications for this visit.     Allergies-reviewed and updated Allergies  Allergen Reactions  . Celebrex [Celecoxib] Other (See Comments)    "ran my b/p up high,  almost had a stroke blood pressure  . Ivp Dye [Iodinated Diagnostic Agents] Other (See Comments)    "started burning like fire" burning  . Bextra [Valdecoxib] Other (See Comments)    Pt passed out Increase blood pressure  . Chlorpromazine     Pt states he thinks he becomes dizzy at times, but hard to recall at this time  . Ciprofloxacin     Unknown   . Cyclopentolate Other (See Comments)    Pt is not to use eye drops due to adverse reactions  . Flomax [Tamsulosin Hcl] Other (See Comments)    Blurred vision  . Hyoscyamine Other (See Comments)    "I pass out"  . Imuran [Azathioprine] Other (See Comments)    Patient unable to provide any additional info.  Not sure of this drug at all even after reviewing possible uses. Passed out  . Methotrexate Other (See Comments)    Caused anemia  . Methotrexate Derivatives     Killed red blood cells, zombie like, killed immune system   . Nasal Spray Other (See Comments)    "Blurred vision"  . Penicillins Other (See Comments)    Problems with urination and GI problems    Social History   Socioeconomic History  . Marital status: Widowed    Spouse name: Not on file  . Number of children: 3  . Years of education: 7 th  . Highest education level: Not on file  Occupational History    Comment: rettired.  Social Needs  . Financial resource strain: Not on file  . Food insecurity:    Worry: Not on file    Inability: Not on file  . Transportation needs:    Medical: Not on file    Non-medical: Not on file  Tobacco Use  . Smoking status: Never Smoker  . Smokeless tobacco: Never Used  Substance and Sexual Activity  . Alcohol use: No    Alcohol/week: 0.0 oz  . Drug use: No  . Sexual activity: Never  Lifestyle  . Physical activity:    Days per week: Not on file    Minutes per session: Not on file  . Stress: Not on file  Relationships  . Social connections:    Talks on phone: Not on file    Gets together: Not on file    Attends  religious service: Not on file    Active member of club or organization: Not on file    Attends meetings of clubs or  organizations: Not on file    Relationship status: Not on file  Other Topics Concern  . Not on file  Social History Narrative   Patient lives at home alone and he is widowed. Had 13 brothers and sisters. Has 3 children.   Retired.   Education high school.   Right handed.   Caffeine Very rare soda and one coffee.   Enjoys bowling, not able to play after knee replacements    Objective:  Physical Exam: BP 124/60   Pulse 61   Temp 98.2 F (36.8 C) (Oral)   Ht 6\' 1"  (1.854 m)   Wt 203 lb (92.1 kg)   SpO2 99%   BMI 26.78 kg/m   Gen: Pleasant AA male, NAD, resting comfortably HEENT: [place], AT. Ptosis of both eyelids R > L. Bilateral cataracts. CV: RRR with no murmurs appreciated Pulm: NWOB, CTAB with no crackles, wheezes, or rhonchi GI: Normal bowel sounds present. Soft, Nontender, Nondistended. MSK: no edema, cyanosis, or clubbing noted Skin: warm, dry Neuro: grossly normal, moves all extremities Psych: Normal affect and thought content   Assessment/Plan:  1. Hypertension, unspecified type Controlled and at goal.  Continue home atenolol 25 mg twice daily.  Obtain baseline labs - CBC - Basic metabolic panel  2. Benign prostatic hyperplasia without lower urinary tract symptoms Patient states that his symptoms are well controlled.  Continue doxazosin 8 mg daily at bedtime and finasteride 5 mg daily at bedtime  3. Need for Tdap vaccination Tetanus booster sent to patient pharmacy - Tdap (BOOSTRIX) 5-2.5-18.5 LF-MCG/0.5 injection; Inject 0.5 mLs into the muscle once for 1 dose.  Dispense: 0.5 mL; Refill: 0  4. Arthritis Stable per patient report.  Will get records from previous PCP  5. Glaucoma, unspecified glaucoma type, unspecified laterality [facility], records viewable in care everywhere  6. Dry skin Recommended use of  moisturizing lotion  Follow-up in 1 to 2 weeks to talk about patient's various other concerns  Leland HerElsia J Carrington Mullenax, DO PGY-2, [facility] 08/22/2017 1:38 PM

## 2017-08-23 LAB — BASIC METABOLIC PANEL
BUN/Creatinine Ratio: 6 — ABNORMAL LOW (ref 10–24)
BUN: 7 mg/dL — ABNORMAL LOW (ref 8–27)
CO2: 27 mmol/L (ref 20–29)
Calcium: 9.5 mg/dL (ref 8.6–10.2)
Chloride: 104 mmol/L (ref 96–106)
Creatinine, Ser: 1.13 mg/dL (ref 0.76–1.27)
GFR calc Af Amer: 71 mL/min/{1.73_m2} (ref >59)
GFR, EST NON AFRICAN AMERICAN: 61 mL/min/{1.73_m2} (ref >59)
Glucose: 78 mg/dL (ref 65–99)
POTASSIUM: 4.8 mmol/L (ref 3.5–5.2)
SODIUM: 142 mmol/L (ref 134–144)

## 2017-08-23 LAB — CBC
HEMATOCRIT: 38.2 % (ref 37.5–51.0)
Hemoglobin: 12.9 g/dL — ABNORMAL LOW (ref 13.0–17.7)
MCH: 31.1 pg (ref 26.6–33.0)
MCHC: 33.8 g/dL (ref 31.5–35.7)
MCV: 92 fL (ref 79–97)
Platelets: 160 10*3/uL (ref 150–450)
RBC: 4.15 x10E6/uL (ref 4.14–5.80)
RDW: 14.6 % (ref 12.3–15.4)
WBC: 4.3 10*3/uL (ref 3.4–10.8)

## 2017-08-24 ENCOUNTER — Encounter: Payer: Self-pay | Admitting: Family Medicine

## 2017-09-03 ENCOUNTER — Telehealth: Payer: Self-pay

## 2017-09-03 NOTE — Telephone Encounter (Signed)
Pt called nurse line asking for a call back from PCP. Pt states he received a letter in the mail about his blood work and he wants pcp to break it down for him. [phone].

## 2017-09-03 NOTE — Telephone Encounter (Signed)
Patient inquired about PSA, since he is on doxazosin and finasteride and doing well relatively well without B symptoms discussed that would not need to check PSA at this time unless patient was more symptomatic. Patient voiced good understanding.

## 2018-05-10 ENCOUNTER — Other Ambulatory Visit: Payer: Self-pay | Admitting: Internal Medicine

## 2018-05-10 ENCOUNTER — Ambulatory Visit
Admission: RE | Admit: 2018-05-10 | Discharge: 2018-05-10 | Disposition: A | Discharge status: Home / Self Care | Payer: Medicare Other | Source: Ambulatory Visit | Attending: Internal Medicine | Admitting: Internal Medicine

## 2018-05-10 DIAGNOSIS — M25552 Pain in left hip: Secondary | ICD-10-CM

## 2018-05-10 DIAGNOSIS — S99922A Unspecified injury of left foot, initial encounter: Secondary | ICD-10-CM

## 2018-11-07 ENCOUNTER — Emergency Department (HOSPITAL_COMMUNITY): Payer: Medicare Other

## 2018-11-07 ENCOUNTER — Other Ambulatory Visit: Payer: Self-pay

## 2018-11-07 ENCOUNTER — Emergency Department (HOSPITAL_COMMUNITY)
Admission: EM | Admit: 2018-11-07 | Discharge: 2018-11-07 | Disposition: A | Discharge status: Home / Self Care | Payer: Medicare Other | Attending: Emergency Medicine | Admitting: Emergency Medicine

## 2018-11-07 DIAGNOSIS — Y929 Unspecified place or not applicable: Secondary | ICD-10-CM | POA: Diagnosis not present

## 2018-11-07 DIAGNOSIS — Y939 Activity, unspecified: Secondary | ICD-10-CM | POA: Diagnosis not present

## 2018-11-07 DIAGNOSIS — S0003XA Contusion of scalp, initial encounter: Secondary | ICD-10-CM | POA: Insufficient documentation

## 2018-11-07 DIAGNOSIS — S40211A Abrasion of right shoulder, initial encounter: Secondary | ICD-10-CM | POA: Insufficient documentation

## 2018-11-07 DIAGNOSIS — R51 Headache: Secondary | ICD-10-CM | POA: Insufficient documentation

## 2018-11-07 DIAGNOSIS — Y999 Unspecified external cause status: Secondary | ICD-10-CM | POA: Diagnosis not present

## 2018-11-07 DIAGNOSIS — I10 Essential (primary) hypertension: Secondary | ICD-10-CM | POA: Diagnosis not present

## 2018-11-07 DIAGNOSIS — S42101A Fracture of unspecified part of scapula, right shoulder, initial encounter for closed fracture: Secondary | ICD-10-CM

## 2018-11-07 DIAGNOSIS — S4991XA Unspecified injury of right shoulder and upper arm, initial encounter: Secondary | ICD-10-CM | POA: Diagnosis present

## 2018-11-07 DIAGNOSIS — W208XXA Other cause of strike by thrown, projected or falling object, initial encounter: Secondary | ICD-10-CM | POA: Insufficient documentation

## 2018-11-07 DIAGNOSIS — Z79899 Other long term (current) drug therapy: Secondary | ICD-10-CM | POA: Diagnosis not present

## 2018-11-07 DIAGNOSIS — T07XXXA Unspecified multiple injuries, initial encounter: Secondary | ICD-10-CM

## 2018-11-07 MED ORDER — OXYCODONE-ACETAMINOPHEN 5-325 MG PO TABS
1.0000 | ORAL_TABLET | Freq: Once | ORAL | Status: AC
  Administered 2018-11-07: 19:00:00 1 via ORAL
  Filled 2018-11-07: qty 1

## 2018-11-07 MED ORDER — HYDROCODONE-ACETAMINOPHEN 5-325 MG PO TABS: 1.0000 | ORAL_TABLET | Freq: Three times a day (TID) | ORAL | 0 refills | Status: AC | PRN

## 2018-11-07 NOTE — ED Notes (Signed)
Patient transported to CT 

## 2018-11-07 NOTE — ED Provider Notes (Signed)
[place] EMERGENCY DEPARTMENT Provider Note   CSN: [id number] Arrival date & time: 11/07/18  1341     History   Chief Complaint Chief Complaint  Patient presents with   Head Injury   Shoulder Pain    HPI [name] is a 83 y.o. male with a past medical history of glaucoma, hypertension who presents emergency department with head and right shoulder pain.  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  Patient reports he thinks he lost consciousness.  Patient denies being on any blood thinners.  Patient reports pain to the right shoulder which is worse with movement.  Patient denies any difficulty breathing, changes in his vision, nausea, vomiting, numbness, weakness.  Patient reports he called his doctor who wanted him to be evaluated yesterday but he came to the hospital today.  Patient reports he is been putting ginger violet topically on his head where it is sore.     The history is provided by the patient.    Past Medical History:  Diagnosis Date   Anemia    Arthritis    Complication of anesthesia    HARD TO WAKE UP   Constipation    DJD (degenerative joint disease)    Glaucoma    Headache    hx of   Heart murmur    History of kidney stones    HTN (hypertension)    Otitis media    with intermittent vertigo   Pneumonia    PONV (postoperative nausea and vomiting)    Weakness    of neck muscles    Patient Active Problem List   Diagnosis Date Noted   BPH (benign prostatic hyperplasia) 08/22/2017   Arthralgia, neck 05/20/2014   Arthritis    OA (osteoarthritis) of knee 08/05/2012   Allergic rhinitis 03/29/2012   Glaucoma 03/29/2012   HTN (hypertension)    DJD (degenerative joint disease)    Ptosis of eyelid 04/18/2011    Past Surgical History:  Procedure Laterality Date   BACK SURGERY  2000   Carpal Tunnel Left      03/2014   CARPAL TUNNELL  1970'S   EYE SURGERY  06/10/2009 / 07/17/12   glaucoma    TONSILLECTOMY     TOTAL KNEE ARTHROPLASTY Left 08/05/2012   Procedure: LEFT TOTAL KNEE ARTHROPLASTY;  Surgeon: Gearlean Alf, MD;  Location: [facility];  Service: Orthopedics;  Laterality: Left;   TOTAL KNEE ARTHROPLASTY Right 09/25/2016   Procedure: RIGHT TOTAL KNEE ARTHROPLASTY;  Surgeon: Gaynelle Arabian, MD;  Location: [facility];  Service: Orthopedics;  Laterality: Right;        Home Medications    Prior to Admission medications   Medication Sig Start Date End Date Taking? Authorizing Provider  acetaminophen (TYLENOL) 500 MG tablet Take 1,000 mg by mouth every 6 (six) hours as needed for mild pain or moderate pain.     [provider]  atenolol (TENORMIN) 50 MG tablet Take 25 mg by mouth 2 (two) times daily.     [provider]  brimonidine (ALPHAGAN) 0.2 % ophthalmic solution Place 1 drop into both eyes 2 (two) times daily.     [provider]  dorzolamide-timolol (COSOPT) 22.3-6.8 MG/ML ophthalmic solution Place 1 drop into both eyes 2 (two) times daily.     [provider]  doxazosin (CARDURA) 8 MG tablet Take 8 mg by  mouth at bedtime.     [provider]  finasteride (PROSCAR) 5 MG tablet Take 5 mg by mouth at bedtime.     [provider]  HYDROcodone-acetaminophen (NORCO/VICODIN) 5-325 MG tablet Take 1 tablet by mouth every 8 (eight) hours as needed for up to 3 days for severe pain. 11/07/18 11/10/18  Derwood Kaplan, MD  losartan-hydrochlorothiazide (HYZAAR) 100-25 MG per tablet Take 1 tablet by mouth daily. If BP is below 139 will not take med. If BP is 140 - takes med    [provider]  prednisoLONE acetate (PRED FORTE) 1 % ophthalmic suspension Place 1 drop into both eyes daily.    [provider]    Family History Family History  Problem Relation Age of Onset   Arthritis Mother    Glaucoma Mother    Hypertension Mother     GER disease Father    Hypertension Father    Diabetes Brother    Hypertension Brother    ALS Brother    Cancer Brother        unknown   Dementia Brother    Diabetes Daughter    Diabetes Daughter    Diabetes Son    Dementia Sister    Cancer Sister        unknown   Heart disease Brother    Hypertension Brother    Hyperlipidemia Sister     Social History Social History   Tobacco Use   Smoking status: Never Smoker   Smokeless tobacco: Never Used  Substance Use Topics   Alcohol use: No    Alcohol/week: 0.0 standard drinks   Drug use: No     Allergies   Celebrex [celecoxib], Ivp dye [iodinated diagnostic agents], Bextra [valdecoxib], Chlorpromazine, Ciprofloxacin, Cyclopentolate, Flomax [tamsulosin hcl], Hyoscyamine, Imuran [azathioprine], Methotrexate, Methotrexate derivatives, Nasal spray, and Penicillins   Review of Systems Review of Systems  Constitutional: Negative for fever.  HENT: Negative for trouble swallowing.   Respiratory: Negative for cough and shortness of breath.   Cardiovascular: Negative for chest pain and leg swelling.  Gastrointestinal: Negative for abdominal pain, constipation, diarrhea, nausea and vomiting.  Genitourinary: Negative for difficulty urinating.  Musculoskeletal: Positive for arthralgias. Negative for gait problem.  Skin: Positive for wound.  Neurological: Positive for syncope. Negative for dizziness, facial asymmetry, speech difficulty, weakness and numbness.  Psychiatric/Behavioral: Negative for confusion.     Physical Exam Updated Vital Signs BP (!) 129/56 (BP Location: Left Arm)    Pulse 66    Temp 99.2 F (37.3 C) (Oral)    Resp 18    SpO2 99%   Physical Exam Constitutional:      General: He is not in acute distress. HENT:     Head: Normocephalic.     Comments: hematoma to R side of head with ointment applied    Right Ear: External ear normal.     Left Ear: External ear normal.     Nose: Nose  normal.     Mouth/Throat:     Mouth: Mucous membranes are moist.     Pharynx: Oropharynx is clear.  Eyes:     Pupils: Pupils are equal, round, and reactive to light.  Neck:     Musculoskeletal: Muscular tenderness present.     Comments: c-collar inplace Cardiovascular:     Rate and Rhythm: Normal rate and regular rhythm.     Pulses: Normal pulses.  Pulmonary:     Effort: Pulmonary effort is normal. No respiratory distress.     Breath  sounds: Normal breath sounds. No stridor. No wheezing or rhonchi.  Abdominal:     Palpations: Abdomen is soft.     Tenderness: There is no abdominal tenderness. There is no guarding or rebound.  Musculoskeletal:        General: Tenderness (over R shoulder and scapula) present.     Right elbow: He exhibits no deformity. No tenderness found.     Right wrist: He exhibits no tenderness.     Thoracic back: He exhibits no tenderness and no deformity.     Lumbar back: He exhibits no tenderness and no deformity.     Right lower leg: No edema.     Left lower leg: No edema.  Skin:    General: Skin is warm and dry.     Findings: Abrasion (R shoulder) present.  Neurological:     General: No focal deficit present.     Mental Status: He is alert and oriented to person, place, and time.     Cranial Nerves: No cranial nerve deficit.     Sensory: No sensory deficit.     Motor: No weakness.     Coordination: Coordination normal.      ED Treatments / Results  Labs (all labs ordered are listed, but only abnormal results are displayed) Labs Reviewed - No data to display  EKG None  Radiology Dg Shoulder Right  Result Date: 11/07/2018 CLINICAL DATA:  Struck by log which fell off tractor EXAM: RIGHT SHOULDER - 2+ VIEW COMPARISON:  Shoulder radiograph October 04, 2013 FINDINGS: Displaced fracture fragment is seen along the inferior border of the scapula. Humeral head remains normally located with mild soft tissue mineralization adjacent the greater tuberosity  likely reflecting calcific tendinosis. Mild glenohumeral and acromioclavicular arthrosis is present. Extensive swelling is seen over the posterior aspect of the shoulder. Included portions of the chest wall are unremarkable. IMPRESSION: Displaced fracture fragment along the inferior border of the scapula. Electronically Signed   By: Kreg ShropshirePrice  DeHay M.D.   On: 11/07/2018 14:54   Ct Head Wo Contrast  Result Date: 11/07/2018 CLINICAL DATA:  Hip by log but fall off tractor last night with abrasion to head and neck pain. EXAM: CT HEAD WITHOUT CONTRAST TECHNIQUE: Contiguous axial images were obtained from the base of the skull through the vertex without intravenous contrast. COMPARISON:  10/08/2013 FINDINGS: Brain: Ventricles, cisterns and other CSF spaces are normal. There is no mass, mass effect, shift of midline structures or acute hemorrhage. No evidence of acute infarction. Vascular: No hyperdense vessel or unexpected calcification. Skull: Normal. Negative for fracture or focal lesion. Sinuses/Orbits: No acute finding. Other: None. IMPRESSION: No acute findings. Electronically Signed   By: Elberta Fortisaniel  Boyle M.D.   On: 11/07/2018 17:20   Ct Cervical Spine Wo Contrast  Result Date: 11/07/2018 CLINICAL DATA:  Patient status post fall. EXAM: CT CERVICAL SPINE WITHOUT CONTRAST TECHNIQUE: Multidetector CT imaging of the cervical spine was performed without intravenous contrast. Multiplanar CT image reconstructions were also generated. COMPARISON:  CT cervical spine 10/04/2013 FINDINGS: Alignment: Normal anatomic alignment. Skull base and vertebrae: Intact. Soft tissues and spinal canal: Unremarkable. Disc levels: Multilevel bridging anterior osteophytes throughout the cervical spine. No evidence for acute fracture. Preservation of the vertebral body heights. Upper chest: Unremarkable. Other: None. IMPRESSION: No evidence for acute cervical spine fracture. Electronically Signed   By: Annia Beltrew  Davis M.D.   On: 11/07/2018 19:16     Procedures Procedures (including critical care time)  Medications Ordered in ED Medications  oxyCODONE-acetaminophen (  PERCOCET/ROXICET) 5-325 MG per tablet 1 tablet (1 tablet Oral Given 11/07/18 1923)     Initial Impression / Assessment and Plan / ED Course  I have reviewed the triage vital signs and the nursing notes.  Pertinent labs & imaging results that were available during my care of the patient were reviewed by me and considered in my medical decision making (see chart for details).       GCS of 15 and intact airway, breathing, and circulation on arrival.  Patient's right upper extremity is neurovascularly intact.  CT of the head obtained in triage showed no acute intracranial abnormality.  X-ray of the knee obtained in triage showed fracture of the right scapula.  Given mechanism and cervical neck tenderness a CT of the C-spine was also ordered which was negative for acute fracture.  Patient given pain medication while in the emergency department.  Patient placed in a sling.  Patient provided with referral for orthopedic outpatient follow-up.  All questions answered and strict return precautions given.  Patient given a prescription for short course of Norco for pain.  Patient family comfortable with plan to wear sling, continue supportive care, and follow-up with orthopedic surgery as an outpatient.  Patient was seen and plan discussed with Dr. Rhunette CroftNanavati  Final Clinical Impressions(s) / ED Diagnoses   Final diagnoses:  Closed fracture of right scapula, unspecified part of scapula, initial encounter  Multiple contusions    ED Discharge Orders         Ordered    HYDROcodone-acetaminophen (NORCO/VICODIN) 5-325 MG tablet  Every 8 hours PRN     11/07/18 1927           Ignacia PalmaKuefler, Andie Mortimer S, MD 11/08/18 Jodean [id number]    Nanavati, Ankit, MD 11/10/18 2330

## 2018-11-07 NOTE — Progress Notes (Signed)
Orthopedic Tech Progress Note Patient Details:  [name] Sep 16, 1935 [id number]  Ortho Devices Type of Ortho Device: Shoulder immobilizer Ortho Device/Splint Location: right Ortho Device/Splint Interventions: Application   Post Interventions Patient Tolerated: Well Instructions Provided: Care of device   [name] 11/07/2018, 6:07 PM

## 2018-11-07 NOTE — ED Triage Notes (Signed)
Patient reports head injury and R shoulder pain after log fell off tractor lift last night. Unwitnessed LOC for unknown time. Patient called his doctor who wanted him to be evaluated last night, but since he was alert, he could be seen today. Abrasion noted to scalp (patient put ginger violet ointment on for soreness) and limited ROM of head/neck and R arm d/t pain. C-collar placed in triage. Not on blood thinner.

## 2018-11-15 ENCOUNTER — Ambulatory Visit (INDEPENDENT_AMBULATORY_CARE_PROVIDER_SITE_OTHER): Payer: Medicare Other

## 2018-11-15 ENCOUNTER — Ambulatory Visit (INDEPENDENT_AMBULATORY_CARE_PROVIDER_SITE_OTHER): Payer: Medicare Other | Admitting: Orthopedic Surgery

## 2018-11-15 ENCOUNTER — Encounter: Payer: Self-pay | Admitting: Orthopedic Surgery

## 2018-11-15 DIAGNOSIS — M25511 Pain in right shoulder: Secondary | ICD-10-CM

## 2018-11-17 ENCOUNTER — Encounter: Payer: Self-pay | Admitting: Orthopedic Surgery
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
## 2018-12-16 ENCOUNTER — Ambulatory Visit (INDEPENDENT_AMBULATORY_CARE_PROVIDER_SITE_OTHER): Payer: Medicare Other | Admitting: Orthopedic Surgery

## 2018-12-16 ENCOUNTER — Other Ambulatory Visit: Payer: Self-pay

## 2018-12-16 ENCOUNTER — Encounter: Payer: Self-pay | Admitting: Orthopedic Surgery

## 2018-12-16 VITALS — Ht 74.0 in | Wt 203.0 lb

## 2018-12-16 DIAGNOSIS — M25511 Pain in right shoulder: Secondary | ICD-10-CM
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
## 2019-02-17 ENCOUNTER — Ambulatory Visit (INDEPENDENT_AMBULATORY_CARE_PROVIDER_SITE_OTHER): Payer: Medicare Other | Admitting: Orthopedic Surgery

## 2019-02-17 ENCOUNTER — Other Ambulatory Visit: Payer: Self-pay

## 2019-02-17 DIAGNOSIS — M25511 Pain in right shoulder: Secondary | ICD-10-CM

## 2019-02-17 DIAGNOSIS — S42191D Fracture of other part of scapula, right shoulder, subsequent encounter for fracture with routine healing: Secondary | ICD-10-CM

## 2019-02-21 ENCOUNTER — Encounter: Payer: Self-pay | Admitting: Orthopedic Surgery
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
## 2019-03-14 ENCOUNTER — Ambulatory Visit: Payer: Medicare Other | Admitting: Physical Therapy

## 2019-03-28 ENCOUNTER — Other Ambulatory Visit: Payer: Self-pay | Admitting: Internal Medicine

## 2019-03-28 DIAGNOSIS — R519 Headache, unspecified: Secondary | ICD-10-CM

## 2019-03-28 DIAGNOSIS — W19XXXA Unspecified fall, initial encounter: Secondary | ICD-10-CM

## 2019-03-31 ENCOUNTER — Ambulatory Visit: Payer: Medicare Other | Admitting: Orthopedic Surgery

## 2019-04-02 ENCOUNTER — Ambulatory Visit (INDEPENDENT_AMBULATORY_CARE_PROVIDER_SITE_OTHER): Payer: Medicare Other | Admitting: Orthopedic Surgery

## 2019-04-02 ENCOUNTER — Ambulatory Visit (INDEPENDENT_AMBULATORY_CARE_PROVIDER_SITE_OTHER): Payer: Medicare Other

## 2019-04-02 ENCOUNTER — Other Ambulatory Visit: Payer: Self-pay

## 2019-04-02 DIAGNOSIS — M25511 Pain in right shoulder: Secondary | ICD-10-CM

## 2019-04-02 DIAGNOSIS — M542 Cervicalgia: Secondary | ICD-10-CM

## 2019-04-03 ENCOUNTER — Encounter: Payer: Self-pay | Admitting: Orthopedic Surgery

## 2019-04-03 NOTE — Progress Notes (Signed)
Office Visit Note   Patient: [name]           Date of Birth: 01-19-1936           MRN: [id number] Visit Date: 04/02/2019 Requested by: Benay Pike, MD [phone] [place],  [place] [address] PCP: Benay Pike, MD  Subjective: Chief Complaint  Patient presents with  . Right Shoulder - Pain, Follow-up    HPI: [name] is an 84 year old patient who is now about 4 months out right scapular fracture.  In general the shoulder is slightly manageable.  He states that he did fall again last week.  Reports some stiffness and muscle spasms.  He has not had any physical therapy yet.  He does state that it is hard for him to hold his head up.  Denies any proximal motor weakness.              ROS: All systems reviewed are negative as they relate to the chief complaint within the history of present illness.  Patient denies  fevers or chills.   Assessment & Plan: Visit Diagnoses:  1. Right shoulder pain, unspecified chronicity   2. Cervicalgia     Plan: Impression is slight scapular winging consistent with malunion/nonunion inferior pole scapular fracture.  The scapular winging is not severe.  More rotational then dorsal displacement.  Rotator cuff strength is reasonable with no tenderness around the coracoid.  I am concerned about his neck muscle weakness.  CT scan at the time negative for any significant problem acutely in the neck at the time of injury.  Nonetheless I do not have a great explanation for the difficulty he is having holding his head up.  Plan MRI scan cervical spine for evaluation and we will see him back after that study.  Follow-Up Instructions: Return for after MRI.   Orders:  Orders Placed This Encounter  Procedures  . XR Shoulder Right  . MR Cervical Spine w/o contrast   No orders of the defined types were placed in this encounter.     Procedures: No procedures performed   Clinical Data: No additional findings.  Objective: Vital Signs: There  were no vitals taken for this visit.  Physical Exam:   Constitutional: Patient appears well-developed HEENT:  Head: Normocephalic Eyes:EOM are normal Neck: Normal range of motion Cardiovascular: Normal rate Pulmonary/chest: Effort normal Neurologic: Patient is alert Skin: Skin is warm Psychiatric: Patient has normal mood and affect    Ortho Exam: Ortho exam demonstrates some weakness with head extension.  Motor sensory function hand is intact.  Hip flexion strength symmetric with negative clonus negative Babinski.  He does have a little bit of rotational winging of the scapula on the right-hand side compared to the left.  He does have good rotator cuff strength with no coarse grinding or crepitus with active or passive range of motion of that right shoulder.  No coracoid joint tenderness.  Specialty Comments:  No specialty comments available.  Imaging: XR Shoulder Right  Result Date: 04/03/2019 AP axillary outlet right shoulder reviewed.  Again noted is inferior pole scapular fracture with displacement of about 4 mm.  No callus formation present.  Coracoid process also atypically shaped elongated in the superior to inferior plane.  Shoulder joint itself is located.  No acute appearing fracture around the glenohumeral joint.    PMFS History: Patient Active Problem List   Diagnosis Date Noted  . BPH (benign prostatic hyperplasia) 08/22/2017  . Arthralgia, neck 05/20/2014  .  Arthritis   . OA (osteoarthritis) of knee 08/05/2012  . Allergic rhinitis 03/29/2012  . Glaucoma 03/29/2012  . HTN (hypertension)   . DJD (degenerative joint disease)   . Ptosis of eyelid 04/18/2011   Past Medical History:  Diagnosis Date  . Anemia   . Arthritis   . Complication of anesthesia    HARD TO WAKE UP  . Constipation   . DJD (degenerative joint disease)   . Glaucoma   . Headache    hx of  . Heart murmur   . History of kidney stones   . HTN (hypertension)   . Otitis media    with  intermittent vertigo  . Pneumonia   . PONV (postoperative nausea and vomiting)   . Weakness    of neck muscles    Family History  Problem Relation Age of Onset  . Arthritis Mother   . Glaucoma Mother   . Hypertension Mother   . GER disease Father   . Hypertension Father   . Diabetes Brother   . Hypertension Brother   . ALS Brother   . Cancer Brother        unknown  . Dementia Brother   . Diabetes Daughter   . Diabetes Daughter   . Diabetes Son   . Dementia Sister   . Cancer Sister        unknown  . Heart disease Brother   . Hypertension Brother   . Hyperlipidemia Sister     Past Surgical History:  Procedure Laterality Date  . BACK SURGERY  2000  . Carpal Tunnel Left    03/2014  . CARPAL TUNNELL  1970'S  . EYE SURGERY  06/10/2009 / 07/17/12   glaucoma   . TONSILLECTOMY    . TOTAL KNEE ARTHROPLASTY Left 08/05/2012   Procedure: LEFT TOTAL KNEE ARTHROPLASTY;  Surgeon: Loanne Drilling, MD;  Location: [facility];  Service: Orthopedics;  Laterality: Left;  . TOTAL KNEE ARTHROPLASTY Right 09/25/2016   Procedure: RIGHT TOTAL KNEE ARTHROPLASTY;  Surgeon: Ollen Gross, MD;  Location: [facility];  Service: Orthopedics;  Laterality: Right;   Social History   Occupational History    Comment: rettired.  Tobacco Use  . Smoking status: Never Smoker  . Smokeless tobacco: Never Used  Substance and Sexual Activity  . Alcohol use: No    Alcohol/week: 0.0 standard drinks  . Drug use: No  . Sexual activity: Never

## 2019-04-04 ENCOUNTER — Ambulatory Visit
Admission: RE | Admit: 2019-04-04 | Discharge: 2019-04-04 | Disposition: A | Discharge status: Home / Self Care | Payer: Medicare Other | Source: Ambulatory Visit | Attending: Internal Medicine | Admitting: Internal Medicine

## 2019-04-04 DIAGNOSIS — R519 Headache, unspecified: Secondary | ICD-10-CM

## 2019-04-04 DIAGNOSIS — W19XXXA Unspecified fall, initial encounter: Secondary | ICD-10-CM

## 2019-04-07 ENCOUNTER — Ambulatory Visit: Payer: Medicare Other | Admitting: Physical Therapy

## 2019-04-09 ENCOUNTER — Ambulatory Visit: Payer: Medicare Other | Admitting: Physical Therapy

## 2019-04-10 ENCOUNTER — Other Ambulatory Visit: Payer: Self-pay | Admitting: *Deleted

## 2019-04-10 DIAGNOSIS — S42191D Fracture of other part of scapula, right shoulder, subsequent encounter for fracture with routine healing: Secondary | ICD-10-CM

## 2019-04-10 DIAGNOSIS — M25511 Pain in right shoulder: Secondary | ICD-10-CM

## 2019-04-16 ENCOUNTER — Ambulatory Visit (INDEPENDENT_AMBULATORY_CARE_PROVIDER_SITE_OTHER): Payer: Medicare Other | Admitting: Physical Therapy

## 2019-04-16 ENCOUNTER — Other Ambulatory Visit: Payer: Self-pay

## 2019-04-16 ENCOUNTER — Encounter: Payer: Self-pay | Admitting: Physical Therapy

## 2019-04-16 DIAGNOSIS — M542 Cervicalgia: Secondary | ICD-10-CM | POA: Diagnosis not present

## 2019-04-16 DIAGNOSIS — R293 Abnormal posture: Secondary | ICD-10-CM

## 2019-04-16 DIAGNOSIS — M6281 Muscle weakness (generalized): Secondary | ICD-10-CM | POA: Diagnosis not present

## 2019-04-16 DIAGNOSIS — M25511 Pain in right shoulder: Secondary | ICD-10-CM
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
## 2019-04-16 NOTE — Patient Instructions (Signed)
Access Code: [email or web address]  URL: [email or web address]  Date: 04/16/2019  Prepared by: Moshe Cipro   Exercises Seated Upper Trapezius Stretch - 3 reps - 1 sets - 30 sec hold - 2x daily - 7x weekly Seated Scapular Retraction - 10 reps - 1 sets - 5 sec hold - 2x daily - 7x weekly Standing Backward Shoulder Rolls - 10 reps - 1 sets - 2x daily - 7x weekly

## 2019-04-30 ENCOUNTER — Ambulatory Visit: Payer: No Typology Code available for payment source | Admitting: Orthopedic Surgery

## 2019-05-01 ENCOUNTER — Ambulatory Visit (INDEPENDENT_AMBULATORY_CARE_PROVIDER_SITE_OTHER): Payer: Medicare Other | Admitting: Physical Therapy

## 2019-05-01 ENCOUNTER — Other Ambulatory Visit: Payer: Self-pay

## 2019-05-01 ENCOUNTER — Encounter: Payer: Self-pay | Admitting: Physical Therapy

## 2019-05-01 DIAGNOSIS — M25511 Pain in right shoulder: Secondary | ICD-10-CM | POA: Diagnosis not present

## 2019-05-01 DIAGNOSIS — M6281 Muscle weakness (generalized): Secondary | ICD-10-CM

## 2019-05-01 DIAGNOSIS — M542 Cervicalgia: Secondary | ICD-10-CM | POA: Diagnosis not present

## 2019-05-01 DIAGNOSIS — R293 Abnormal posture: Secondary | ICD-10-CM

## 2019-05-01 NOTE — Therapy (Signed)
[facility] Physical Therapy [address] [place], [place], [address] Phone: [phone]   Fax:  [phone]  Physical Therapy Treatment  Patient Details  Name: [name] MRN: [id number] Date of Birth: 1935-08-01 Referring Provider (PT): Marlou Sa Tonna Corner, MD   Encounter Date: 05/01/2019  PT End of Session - 05/01/19 1508    Visit Number  2    Number of Visits  12    Date for PT Re-Evaluation  05/28/19    PT Start Time  1410    PT Stop Time  1443    PT Time Calculation (min)  33 min    Activity Tolerance  Patient tolerated treatment well    Behavior During Therapy  [facility] for tasks assessed/performed       Past Medical History:  Diagnosis Date  . Anemia   . Arthritis   . Complication of anesthesia    HARD TO WAKE UP  . Constipation   . DJD (degenerative joint disease)   . Glaucoma   . Headache    hx of  . Heart murmur   . History of kidney stones   . HTN (hypertension)   . Otitis media    with intermittent vertigo  . Pneumonia   . PONV (postoperative nausea and vomiting)   . Weakness    of neck muscles    Past Surgical History:  Procedure Laterality Date  . BACK SURGERY  2000  . Carpal Tunnel Left    03/2014  . CARPAL TUNNELL  1970'S  . EYE SURGERY  06/10/2009 / 07/17/12   glaucoma   . TONSILLECTOMY    . TOTAL KNEE ARTHROPLASTY Left 08/05/2012   Procedure: LEFT TOTAL KNEE ARTHROPLASTY;  Surgeon: Gearlean Alf, MD;  Location: [facility];  Service: Orthopedics;  Laterality: Left;  . TOTAL KNEE ARTHROPLASTY Right 09/25/2016   Procedure: RIGHT TOTAL KNEE ARTHROPLASTY;  Surgeon: Gaynelle Arabian, MD;  Location: [facility];  Service: Orthopedics;  Laterality: Right;    There were no vitals filed for this visit.  Subjective Assessment - 05/01/19 1412    Subjective  feels more stiff today; reports this has been a change in the past few days.  exercises are helping "loosen things up"    Patient Stated Goals  more mobile, turn head to Rt, hold head up  better    Currently in Pain?  Yes    Pain Score  8     Pain Location  Neck    Pain Orientation  Right    Pain Descriptors / Indicators  Spasm    Pain Type  Chronic pain    Pain Onset  More than a month ago    Pain Frequency  Intermittent    Aggravating Factors   turning head to Rt, lifting    Pain Relieving Factors  tylenol, baclofen                       [facility] Adult PT Treatment/Exercise - 05/01/19 1424      Self-Care   Self-Care  Other Self-Care Comments    Other Self-Care Comments   long discussion with pt about cervical dystonia and role PT plays in diagnosis.  Pt had Botox ~3.5 years ago and feels that following injection to Rt neck, he developed bil UE weakness and that Botox was cause.  He also feels weakness has not improved and is very resistant to getting injections again.  Overall explained that PT could help with stretches, but unable  to change the neurological signals from the CNS.        Neck Exercises: Theraband   Horizontal ABduction  15 reps;Red   supine     Neck Exercises: Supine   Neck Retraction  15 reps;5 secs    Other Supine Exercise  scap retraction 15 x 5 sec                  PT Long Term Goals - 04/16/19 1348      PT LONG TERM GOAL #1   Title  He will be able to hold head up with minimal to no discomfort for 3-5 min.     Status  New    Target Date  05/28/19      PT LONG TERM GOAL #2   Title  He will be able to do all HEP as of last visit    Status  New    Target Date  05/28/19      PT LONG TERM GOAL #3   Title  report Rt shoulder pain < 3/10 with activity for improved function    Status  New    Target Date  05/28/19      PT LONG TERM GOAL #4   Title  demonstrate at least 4/5 Rt shoulder strength for improved function    Status  New    Target Date  05/28/19      PT LONG TERM GOAL #5   Title  n/a            Plan - 05/01/19 1508    Clinical Impression Statement  Pt tolerated session well today with simple  postural exercises in supine.  We did have a long discussion about treatment recommendations and PT"s role in cervical dystonia.  Recommend he see a neurologist to discuss options.  No goals met as only 2nd visit.    Personal Factors and Comorbidities  Comorbidity 3+;Past/Current Experience;Time since onset of injury/illness/exacerbation    Comorbidities  arthritis, HTN, cervical dystonia, bil TKA    Examination-Activity Limitations  Sit;Stand;Hygiene/Grooming;Locomotion Level;Reach Overhead    Examination-Participation Restrictions  Yard Work;Meal Prep;Cleaning    Stability/Clinical Decision Making  Evolving/Moderate complexity    Rehab Potential  Good    PT Frequency  2x / week    PT Duration  6 weeks    PT Treatment/Interventions  ADLs/Self Care Home Management;Cryotherapy;Electrical Stimulation;Moist Heat;Traction;Ultrasound;Functional mobility training;Therapeutic activities;Therapeutic exercise;Manual techniques;Patient/family education;Neuromuscular re-education;Passive range of motion;Dry needling;Taping;Vestibular    PT Next Visit Plan  continue with postural exercises    PT Home Exercise Plan  Access Code: [email or web address]    Consulted and Agree with Plan of Care  Patient       Patient will benefit from skilled therapeutic intervention in order to improve the following deficits and impairments:  Impaired tone, Increased muscle spasms, Pain, Impaired UE functional use, Increased fascial restricitons, Decreased strength, Decreased range of motion, Impaired flexibility, Postural dysfunction  Visit Diagnosis: Cervicalgia  Acute pain of right shoulder  Abnormal posture  Muscle weakness (generalized)     Problem List Patient Active Problem List   Diagnosis Date Noted  . BPH (benign prostatic hyperplasia) 08/22/2017  . Arthralgia, neck 05/20/2014  . Arthritis   . OA (osteoarthritis) of knee 08/05/2012  . Allergic rhinitis 03/29/2012  . Glaucoma 03/29/2012  . HTN (hypertension)   .  DJD (degenerative joint disease)   . Ptosis of eyelid 04/18/2011     Laureen Abrahams, PT, DPT 05/01/19 3:11 PM    [facility] Physical Therapy [address] [place], [place], [address] Phone: [phone]   Fax:  [phone]  Name: RAFIQ BUCKLIN MRN: [id number] Date of Birth: [birth year]

## 2019-05-05 ENCOUNTER — Ambulatory Visit (INDEPENDENT_AMBULATORY_CARE_PROVIDER_SITE_OTHER): Payer: Medicare Other | Admitting: Rehabilitative and Restorative Service Providers"

## 2019-05-05 ENCOUNTER — Encounter: Payer: Self-pay | Admitting: Rehabilitative and Restorative Service Providers"

## 2019-05-05 ENCOUNTER — Other Ambulatory Visit: Payer: Self-pay

## 2019-05-05 DIAGNOSIS — M25511 Pain in right shoulder: Secondary | ICD-10-CM

## 2019-05-05 DIAGNOSIS — M6281 Muscle weakness (generalized): Secondary | ICD-10-CM | POA: Diagnosis not present

## 2019-05-05 DIAGNOSIS — M542 Cervicalgia: Secondary | ICD-10-CM | POA: Diagnosis not present

## 2019-05-05 DIAGNOSIS — R293 Abnormal posture: Secondary | ICD-10-CM

## 2019-05-05 NOTE — Therapy (Signed)
[name] [address] [place], [place], [address] Phone: [phone]   Fax:  [phone]  Physical Therapy Treatment  Patient Details  Name: [name] MRN: [id number] Date of Birth: 02-09-1936 Referring Provider (PT): Marlou Sa Tonna Corner, MD   Encounter Date: 05/05/2019  PT End of Session - 05/05/19 1028    Visit Number  3    Number of Visits  12    Date for PT Re-Evaluation  05/28/19    PT Start Time  9485    PT Stop Time  1055    PT Time Calculation (min)  40 min    Activity Tolerance  Patient tolerated treatment well    Behavior During Therapy  [name] for tasks assessed/performed       Past Medical History:  Diagnosis Date  . Anemia   . Arthritis   . Complication of anesthesia    HARD TO WAKE UP  . Constipation   . DJD (degenerative joint disease)   . Glaucoma   . Headache    hx of  . Heart murmur   . History of kidney stones   . HTN (hypertension)   . Otitis media    with intermittent vertigo  . Pneumonia   . PONV (postoperative nausea and vomiting)   . Weakness    of neck muscles    Past Surgical History:  Procedure Laterality Date  . BACK SURGERY  2000  . Carpal Tunnel Left    03/2014  . CARPAL TUNNELL  1970'S  . EYE SURGERY  06/10/2009 / 07/17/12   glaucoma   . TONSILLECTOMY    . TOTAL KNEE ARTHROPLASTY Left 08/05/2012   Procedure: LEFT TOTAL KNEE ARTHROPLASTY;  Surgeon: Gearlean Alf, MD;  Location: [facility];  Service: Orthopedics;  Laterality: Left;  . TOTAL KNEE ARTHROPLASTY Right 09/25/2016   Procedure: RIGHT TOTAL KNEE ARTHROPLASTY;  Surgeon: Gaynelle Arabian, MD;  Location: [facility];  Service: Orthopedics;  Laterality: Right;    There were no vitals filed for this visit.  Subjective Assessment - 05/05/19 1027    Subjective  Pt. indicated neck sore and tired.  Rated symptoms around 8/10 or so.    Patient Stated Goals  more mobile, turn head to Rt, hold head up better    Pain Score  8     Pain Location  Neck     Pain Orientation  Lower;Medial;Mid;Upper    Pain Onset  More than a month ago                       [name] Adult PT Treatment/Exercise - 05/05/19 0001      Neck Exercises: Machines for Strengthening   UBE (Upper Arm Bike)  L5 10.5 mins UE/LE      Neck Exercises: Seated   Neck Retraction  15 reps    Cervical Rotation  Both;15 reps    Other Seated Exercise  scapular retraction 2 x 10    Other Seated Exercise  thoracic extension over foam x 20, seated unsupported ball toss 20 x              PT Education - 05/05/19 1027    Education Details  Cues for intervention in clinic.    Person(s) Educated  Patient    Methods  Explanation;Demonstration    Comprehension  Verbalized understanding;Returned demonstration          PT Long Term Goals - 04/16/19 1348      PT LONG  TERM GOAL #1   Title  He will be able to hold head up with minimal to no discomfort for 3-5 min.     Status  New    Target Date  05/28/19      PT LONG TERM GOAL #2   Title  He will be able to do all HEP as of last visit    Status  New    Target Date  05/28/19      PT LONG TERM GOAL #3   Title  report Rt shoulder pain < 3/10 with activity for improved function    Status  New    Target Date  05/28/19      PT LONG TERM GOAL #4   Title  demonstrate at least 4/5 Rt shoulder strength for improved function    Status  New    Target Date  05/28/19      PT LONG TERM GOAL #5   Title  n/a            Plan - 05/05/19 1055    Clinical Impression Statement  Postural abnormalities present and evident in clinic.  Intervention today c fair performance overall c focus on postural activation.    Personal Factors and Comorbidities  Comorbidity 3+;Past/Current Experience;Time since onset of injury/illness/exacerbation    Comorbidities  arthritis, HTN, cervical dystonia, bil TKA    Examination-Activity Limitations  Sit;Stand;Hygiene/Grooming;Locomotion Level;Reach Overhead     Examination-Participation Restrictions  Yard Work;Meal Prep;Cleaning    Stability/Clinical Decision Making  Evolving/Moderate complexity    Rehab Potential  Good    PT Frequency  2x / week    PT Duration  6 weeks    PT Treatment/Interventions  ADLs/Self Care Home Management;Cryotherapy;Electrical Stimulation;Moist Heat;Traction;Ultrasound;Functional mobility training;Therapeutic activities;Therapeutic exercise;Manual techniques;Patient/family education;Neuromuscular re-education;Passive range of motion;Dry needling;Taping;Vestibular    PT Next Visit Plan  continue with postural exercises    PT Home Exercise Plan  Access Code: [email or web address]    Consulted and Agree with Plan of Care  Patient       Patient will benefit from skilled therapeutic intervention in order to improve the following deficits and impairments:  Impaired tone, Increased muscle spasms, Pain, Impaired UE functional use, Increased fascial restricitons, Decreased strength, Decreased range of motion, Impaired flexibility, Postural dysfunction  Visit Diagnosis: Cervicalgia  Acute pain of right shoulder  Abnormal posture  Muscle weakness (generalized)     Problem List Patient Active Problem List   Diagnosis Date Noted  . BPH (benign prostatic hyperplasia) 08/22/2017  . Arthralgia, neck 05/20/2014  . Arthritis   . OA (osteoarthritis) of knee 08/05/2012  . Allergic rhinitis 03/29/2012  . Glaucoma 03/29/2012  . HTN (hypertension)   . DJD (degenerative joint disease)   . Ptosis of eyelid 04/18/2011   [name], PT, DPT, OCS, ATC 05/05/19  10:56 AM     [facility] Physical Therapy [address] [place], [place], [address] Phone: [phone]   Fax:  [phone]  Name: [name] MRN: [id number] Date of Birth: November 23, 1935

## 2019-05-08 ENCOUNTER — Ambulatory Visit (INDEPENDENT_AMBULATORY_CARE_PROVIDER_SITE_OTHER): Payer: Medicare Other | Admitting: Physical Therapy

## 2019-05-08 ENCOUNTER — Other Ambulatory Visit: Payer: Self-pay

## 2019-05-08 ENCOUNTER — Encounter: Payer: Self-pay | Admitting: Physical Therapy

## 2019-05-08 DIAGNOSIS — M25511 Pain in right shoulder: Secondary | ICD-10-CM

## 2019-05-08 DIAGNOSIS — M6281 Muscle weakness (generalized): Secondary | ICD-10-CM

## 2019-05-08 DIAGNOSIS — R293 Abnormal posture: Secondary | ICD-10-CM | POA: Diagnosis not present

## 2019-05-08 DIAGNOSIS — M542 Cervicalgia: Secondary | ICD-10-CM

## 2019-05-08 NOTE — Therapy (Signed)
[facility] Physical Therapy [address] [place], [place], [address] Phone: [phone]   Fax:  [phone]  Physical Therapy Treatment  Patient Details  Name: [name] MRN: [id number] Date of Birth: 1935/08/30 Referring Provider (PT): August Saucer Corrie Mckusick, MD   Encounter Date: 05/08/2019  PT End of Session - 05/08/19 1409    Visit Number  4    Number of Visits  12    Date for PT Re-Evaluation  05/28/19    PT Start Time  1317    PT Stop Time  1400    PT Time Calculation (min)  43 min    Activity Tolerance  Patient tolerated treatment well    Behavior During Therapy  [facility] for tasks assessed/performed       Past Medical History:  Diagnosis Date  . Anemia   . Arthritis   . Complication of anesthesia    HARD TO WAKE UP  . Constipation   . DJD (degenerative joint disease)   . Glaucoma   . Headache    hx of  . Heart murmur   . History of kidney stones   . HTN (hypertension)   . Otitis media    with intermittent vertigo  . Pneumonia   . PONV (postoperative nausea and vomiting)   . Weakness    of neck muscles    Past Surgical History:  Procedure Laterality Date  . BACK SURGERY  2000  . Carpal Tunnel Left    03/2014  . CARPAL TUNNELL  1970'S  . EYE SURGERY  06/10/2009 / 07/17/12   glaucoma   . TONSILLECTOMY    . TOTAL KNEE ARTHROPLASTY Left 08/05/2012   Procedure: LEFT TOTAL KNEE ARTHROPLASTY;  Surgeon: Loanne Drilling, MD;  Location: [facility];  Service: Orthopedics;  Laterality: Left;  . TOTAL KNEE ARTHROPLASTY Right 09/25/2016   Procedure: RIGHT TOTAL KNEE ARTHROPLASTY;  Surgeon: Ollen Gross, MD;  Location: [facility];  Service: Orthopedics;  Laterality: Right;    There were no vitals filed for this visit.  Subjective Assessment - 05/08/19 1321    Subjective  no pain today, just sore and stiff.  feels a little better today - thinks he can hold his head back a little further.    Patient Stated Goals  more mobile, turn head to Rt, hold head up  better    Currently in Pain?  No/denies    Pain Onset  More than a month ago                       [facility] Adult PT Treatment/Exercise - 05/08/19 1321      Neck Exercises: Machines for Strengthening   UBE (Upper Arm Bike)  L2 x 6 min (UE/LE) on UBE      Neck Exercises: Theraband   Shoulder External Rotation  20 reps;Red    Shoulder External Rotation Limitations  seated    Horizontal ABduction  20 reps;Red    Horizontal ABduction Limitations  seated      Neck Exercises: Seated   Other Seated Exercise  overhead reach with ball 2x10    Other Seated Exercise  thoracic extension over foam x 20, seated unsupported ball toss 20 x                   PT Long Term Goals - 04/16/19 1348      PT LONG TERM GOAL #1   Title  He will be able to hold head up with  minimal to no discomfort for 3-5 min.     Status  New    Target Date  05/28/19      PT LONG TERM GOAL #2   Title  He will be able to do all HEP as of last visit    Status  New    Target Date  05/28/19      PT LONG TERM GOAL #3   Title  report Rt shoulder pain < 3/10 with activity for improved function    Status  New    Target Date  05/28/19      PT LONG TERM GOAL #4   Title  demonstrate at least 4/5 Rt shoulder strength for improved function    Status  New    Target Date  05/28/19      PT LONG TERM GOAL #5   Title  n/a            Plan - 05/08/19 1409    Clinical Impression Statement  Pt needs min cues for posture with exercises and still with limited sitting tolerance due to posture and weakness.  Will continue to benefit from PT to maximize function.    Personal Factors and Comorbidities  Comorbidity 3+;Past/Current Experience;Time since onset of injury/illness/exacerbation    Comorbidities  arthritis, HTN, cervical dystonia, bil TKA    Examination-Activity Limitations  Sit;Stand;Hygiene/Grooming;Locomotion Level;Reach Overhead    Examination-Participation Restrictions  Yard Work;Meal  Prep;Cleaning    Stability/Clinical Decision Making  Evolving/Moderate complexity    Rehab Potential  Good    PT Frequency  2x / week    PT Duration  6 weeks    PT Treatment/Interventions  ADLs/Self Care Home Management;Cryotherapy;Electrical Stimulation;Moist Heat;Traction;Ultrasound;Functional mobility training;Therapeutic activities;Therapeutic exercise;Manual techniques;Patient/family education;Neuromuscular re-education;Passive range of motion;Dry needling;Taping;Vestibular    PT Next Visit Plan  continue with postural exercises; core and back strengthening    PT Home Exercise Plan  Access Code: [email or web address]    Consulted and Agree with Plan of Care  Patient       Patient will benefit from skilled therapeutic intervention in order to improve the following deficits and impairments:  Impaired tone, Increased muscle spasms, Pain, Impaired UE functional use, Increased fascial restricitons, Decreased strength, Decreased range of motion, Impaired flexibility, Postural dysfunction  Visit Diagnosis: Cervicalgia  Acute pain of right shoulder  Abnormal posture  Muscle weakness (generalized)     Problem List Patient Active Problem List   Diagnosis Date Noted  . BPH (benign prostatic hyperplasia) 08/22/2017  . Arthralgia, neck 05/20/2014  . Arthritis   . OA (osteoarthritis) of knee 08/05/2012  . Allergic rhinitis 03/29/2012  . Glaucoma 03/29/2012  . HTN (hypertension)   . DJD (degenerative joint disease)   . Ptosis of eyelid 04/18/2011     Laureen Abrahams, PT, DPT 05/08/19 2:13 PM    [facility] [address] [place], [place], [address] Phone: [phone]   Fax:  [phone]  Name: BUBBA VANBENSCHOTEN MRN: [id number] Date of Birth: 1936-02-13

## 2019-05-12 ENCOUNTER — Encounter: Payer: No Typology Code available for payment source | Admitting: Physical Therapy

## 2019-05-12 ENCOUNTER — Telehealth: Payer: Self-pay | Admitting: Physical Therapy

## 2019-05-12 NOTE — Telephone Encounter (Signed)
LVM for pt due to NS for appt.  Requested return call and provided with next appt information.  Clarita Crane, PT, DPT 05/12/19 10:36 AM

## 2019-05-14 ENCOUNTER — Ambulatory Visit (INDEPENDENT_AMBULATORY_CARE_PROVIDER_SITE_OTHER): Payer: Medicare Other | Admitting: Physical Therapy

## 2019-05-14 ENCOUNTER — Encounter: Payer: Self-pay | Admitting: Physical Therapy

## 2019-05-14 ENCOUNTER — Other Ambulatory Visit: Payer: Self-pay

## 2019-05-14 DIAGNOSIS — M542 Cervicalgia: Secondary | ICD-10-CM

## 2019-05-14 DIAGNOSIS — M25511 Pain in right shoulder: Secondary | ICD-10-CM

## 2019-05-14 DIAGNOSIS — M6281 Muscle weakness (generalized): Secondary | ICD-10-CM

## 2019-05-14 DIAGNOSIS — R293 Abnormal posture: Secondary | ICD-10-CM

## 2019-05-14 NOTE — Therapy (Signed)
[facility] Physical Therapy [address] [place], [place], [address] Phone: [phone]   Fax:  [phone]  Physical Therapy Treatment  Patient Details  Name: [name] MRN: [id number] Date of Birth: 25-Mar-1935 Referring Provider (PT): Marlou Sa Tonna Corner, MD   Encounter Date: 05/14/2019  PT End of Session - 05/14/19 1056    Visit Number  5    Number of Visits  12    Date for PT Re-Evaluation  05/28/19    PT Start Time  9211    PT Stop Time  1055    PT Time Calculation (min)  40 min    Activity Tolerance  Patient tolerated treatment well    Behavior During Therapy  [facility] for tasks assessed/performed       Past Medical History:  Diagnosis Date  . Anemia   . Arthritis   . Complication of anesthesia    HARD TO WAKE UP  . Constipation   . DJD (degenerative joint disease)   . Glaucoma   . Headache    hx of  . Heart murmur   . History of kidney stones   . HTN (hypertension)   . Otitis media    with intermittent vertigo  . Pneumonia   . PONV (postoperative nausea and vomiting)   . Weakness    of neck muscles    Past Surgical History:  Procedure Laterality Date  . BACK SURGERY  2000  . Carpal Tunnel Left    03/2014  . CARPAL TUNNELL  1970'S  . EYE SURGERY  06/10/2009 / 07/17/12   glaucoma   . TONSILLECTOMY    . TOTAL KNEE ARTHROPLASTY Left 08/05/2012   Procedure: LEFT TOTAL KNEE ARTHROPLASTY;  Surgeon: Gearlean Alf, MD;  [facility]: [facility];  Service: Orthopedics;  Laterality: Left;  . TOTAL KNEE ARTHROPLASTY Right 09/25/2016   Procedure: RIGHT TOTAL KNEE ARTHROPLASTY;  Surgeon: Gaynelle Arabian, MD;  Location: [facility];  Service: Orthopedics;  Laterality: Right;    There were no vitals filed for this visit.  Subjective Assessment - 05/14/19 1016    Subjective  went to the wrong place on Monday.  doing well. wants to be able to stand up straight (hasn't in over 10 years)    Patient Stated Goals  more mobile, turn head to Rt, hold head up better     Currently in Pain?  No/denies    Pain Onset  More than a month ago                       [facility] Adult PT Treatment/Exercise - 05/14/19 1018      Neck Exercises: Machines for Strengthening   Nustep  L5 x 8 min; UE/LE      Neck Exercises: Theraband   Shoulder Extension  20 reps;Red    Shoulder Extension Limitations  seated    Rows  20 reps;Red    Rows Limitations  seated    Shoulder External Rotation  20 reps;Red    Shoulder External Rotation Limitations  seated    Horizontal ABduction  20 reps;Red    Horizontal ABduction Limitations  seated      Neck Exercises: Seated   Shoulder Flexion  Both;20 reps;Weights    Shoulder Flexion Weights (lbs)  2    Shoulder ABduction  Right;Left;Weights;20 reps    Shoulder Abduction Weights (lbs)  2    Other Seated Exercise  overhead press 2x10; 2#; horizontal abduction 2x10 with 2#    Other Seated  Exercise  bicep curls 2x10; 2#                  PT Long Term Goals - 04/16/19 1348      PT LONG TERM GOAL #1   Title  He will be able to hold head up with minimal to no discomfort for 3-5 min.     Status  New    Target Date  05/28/19      PT LONG TERM GOAL #2   Title  He will be able to do all HEP as of last visit    Status  New    Target Date  05/28/19      PT LONG TERM GOAL #3   Title  report Rt shoulder pain < 3/10 with activity for improved function    Status  New    Target Date  05/28/19      PT LONG TERM GOAL #4   Title  demonstrate at least 4/5 Rt shoulder strength for improved function    Status  New    Target Date  05/28/19      PT LONG TERM GOAL #5   Title  n/a            Plan - 05/14/19 1056    Clinical Impression Statement  Pt tolerated session well focusing on strengthening and postural exercises.  Has MRI scheduled next week, and will continue with postural exercises and strengthening as able.    Personal Factors and Comorbidities  Comorbidity 3+;Past/Current Experience;Time since  onset of injury/illness/exacerbation    Comorbidities  arthritis, HTN, cervical dystonia, bil TKA    Examination-Activity Limitations  Sit;Stand;Hygiene/Grooming;Locomotion Level;Reach Overhead    Examination-Participation Restrictions  Yard Work;Meal Prep;Cleaning    Stability/Clinical Decision Making  Evolving/Moderate complexity    Rehab Potential  Good    PT Frequency  2x / week    PT Duration  6 weeks    PT Treatment/Interventions  ADLs/Self Care Home Management;Cryotherapy;Electrical Stimulation;Moist Heat;Traction;Ultrasound;Functional mobility training;Therapeutic activities;Therapeutic exercise;Manual techniques;Patient/family education;Neuromuscular re-education;Passive range of motion;Dry needling;Taping;Vestibular    PT Next Visit Plan  continue with postural exercises; core and back strengthening    PT Home Exercise Plan  Access Code: [email or web address]    Consulted and Agree with Plan of Care  Patient       Patient will benefit from skilled therapeutic intervention in order to improve the following deficits and impairments:  Impaired tone, Increased muscle spasms, Pain, Impaired UE functional use, Increased fascial restricitons, Decreased strength, Decreased range of motion, Impaired flexibility, Postural dysfunction  Visit Diagnosis: Cervicalgia  Acute pain of right shoulder  Abnormal posture  Muscle weakness (generalized)     Problem List Patient Active Problem List   Diagnosis Date Noted  . BPH (benign prostatic hyperplasia) 08/22/2017  . Arthralgia, neck 05/20/2014  . Arthritis   . OA (osteoarthritis) of knee 08/05/2012  . Allergic rhinitis 03/29/2012  . Glaucoma 03/29/2012  . HTN (hypertension)   . DJD (degenerative joint disease)   . Ptosis of eyelid 04/18/2011      Clarita Crane, PT, DPT 05/14/19 10:58 AM    [facility] Physical Therapy [address] [place], [place], [address] Phone: [phone]   Fax:  [phone]  Name:  [name] MRN: [id number] Date of Birth: [birth year]

## 2019-05-19 ENCOUNTER — Encounter: Payer: Self-pay | Admitting: Physical Therapy

## 2019-05-19 ENCOUNTER — Other Ambulatory Visit: Payer: Self-pay

## 2019-05-19 ENCOUNTER — Ambulatory Visit (INDEPENDENT_AMBULATORY_CARE_PROVIDER_SITE_OTHER): Payer: Medicare Other | Admitting: Physical Therapy

## 2019-05-19 DIAGNOSIS — M6281 Muscle weakness (generalized): Secondary | ICD-10-CM | POA: Diagnosis not present

## 2019-05-19 DIAGNOSIS — M542 Cervicalgia: Secondary | ICD-10-CM

## 2019-05-19 DIAGNOSIS — R293 Abnormal posture: Secondary | ICD-10-CM | POA: Diagnosis not present

## 2019-05-19 DIAGNOSIS — M25511 Pain in right shoulder: Secondary | ICD-10-CM | POA: Diagnosis not present

## 2019-05-19 NOTE — Therapy (Signed)
[facility] Therapy [address] [place], [place], [address] Phone: [phone]   Fax:  [phone]  Physical Therapy Treatment  Patient Details  Name: [name] MRN: [id number] Date of Birth: [birth year] Referring Provider (PT): August Saucer Corrie Mckusick, MD   Encounter Date: 05/19/2019  PT End of Session - 05/19/19 1048    Visit Number  6    Number of Visits  12    Date for PT Re-Evaluation  05/28/19    PT Start Time  1015    PT Stop Time  1055    PT Time Calculation (min)  40 min    Activity Tolerance  Patient tolerated treatment well    Behavior During Therapy  [facility] for tasks assessed/performed       Past Medical History:  Diagnosis Date  . Anemia   . Arthritis   . Complication of anesthesia    HARD TO WAKE UP  . Constipation   . DJD (degenerative joint disease)   . Glaucoma   . Headache    hx of  . Heart murmur   . History of kidney stones   . HTN (hypertension)   . Otitis media    with intermittent vertigo  . Pneumonia   . PONV (postoperative nausea and vomiting)   . Weakness    of neck muscles    Past Surgical History:  Procedure Laterality Date  . BACK SURGERY  2000  . Carpal Tunnel Left    03/2014  . CARPAL TUNNELL  1970'S  . EYE SURGERY  06/10/2009 / 07/17/12   glaucoma   . TONSILLECTOMY    . TOTAL KNEE ARTHROPLASTY Left 08/05/2012   Procedure: LEFT TOTAL KNEE ARTHROPLASTY;  Surgeon: Loanne Drilling, MD;  Location: [facility];  Service: Orthopedics;  Laterality: Left;  . TOTAL KNEE ARTHROPLASTY Right 09/25/2016   Procedure: RIGHT TOTAL KNEE ARTHROPLASTY;  Surgeon: Ollen Gross, MD;  Location: [facility];  Service: Orthopedics;  Laterality: Right;    There were no vitals filed for this visit.  Subjective Assessment - 05/19/19 1017    Subjective  feels sometimes therapy is helping, other times he isn't so sure.  c/o soreness and stiffness.    Patient Stated Goals  more mobile, turn head to Rt, hold head up better    Currently in  Pain?  No/denies   c/o soreness and stiffness   Pain Onset  More than a month ago                       [facility] Adult PT Treatment/Exercise - 05/19/19 1018      Neck Exercises: Machines for Strengthening   Nustep  L5 x 8 min; UE/LE      Neck Exercises: Standing   Other Standing Exercises  single arm bent over row 2x10; 2# bil; single arm bent over fly 2x10; 2# bil    Other Standing Exercises  scap retraction with 5 sec hold x 20 reps      Neck Exercises: Seated   Shoulder Flexion  Both;20 reps;Weights    Shoulder Flexion Weights (lbs)  2    Shoulder ABduction  Right;Left;Weights;20 reps    Shoulder Abduction Weights (lbs)  2    Other Seated Exercise  overhead press 2x10; 2#; horizontal abduction 2x10 with 2#    Other Seated Exercise  bicep curls 2x10; 2#                  PT Long Term Goals -  04/16/19 1348      PT LONG TERM GOAL #1   Title  He will be able to hold head up with minimal to no discomfort for 3-5 min.     Status  New    Target Date  05/28/19      PT LONG TERM GOAL #2   Title  He will be able to do all HEP as of last visit    Status  New    Target Date  05/28/19      PT LONG TERM GOAL #3   Title  report Rt shoulder pain < 3/10 with activity for improved function    Status  New    Target Date  05/28/19      PT LONG TERM GOAL #4   Title  demonstrate at least 4/5 Rt shoulder strength for improved function    Status  New    Target Date  05/28/19      PT LONG TERM GOAL #5   Title  n/a            Plan - 05/19/19 1048    Clinical Impression Statement  Pt tolerates exercises well during session, and overall has occasional episodes of Rt UT spasm that subisides quickly with rest.  Progressing well with PT at this time - feel spasms have decreased in intensity.    Personal Factors and Comorbidities  Comorbidity 3+;Past/Current Experience;Time since onset of injury/illness/exacerbation    Comorbidities  arthritis, HTN, cervical  dystonia, bil TKA    Examination-Activity Limitations  Sit;Stand;Hygiene/Grooming;Locomotion Level;Reach Overhead    Examination-Participation Restrictions  Yard Work;Meal Prep;Cleaning    Stability/Clinical Decision Making  Evolving/Moderate complexity    Rehab Potential  Good    PT Frequency  2x / week    PT Duration  6 weeks    PT Treatment/Interventions  ADLs/Self Care Home Management;Cryotherapy;Electrical Stimulation;Moist Heat;Traction;Ultrasound;Functional mobility training;Therapeutic activities;Therapeutic exercise;Manual techniques;Patient/family education;Neuromuscular re-education;Passive range of motion;Dry needling;Taping;Vestibular    PT Next Visit Plan  continue with postural exercises; core and back strengthening    PT Home Exercise Plan  Access Code: [email or web address]    Consulted and Agree with Plan of Care  Patient       Patient will benefit from skilled therapeutic intervention in order to improve the following deficits and impairments:  Impaired tone, Increased muscle spasms, Pain, Impaired UE functional use, Increased fascial restricitons, Decreased strength, Decreased range of motion, Impaired flexibility, Postural dysfunction  Visit Diagnosis: Cervicalgia  Acute pain of right shoulder  Abnormal posture  Muscle weakness (generalized)     Problem List Patient Active Problem List   Diagnosis Date Noted  . BPH (benign prostatic hyperplasia) 08/22/2017  . Arthralgia, neck 05/20/2014  . Arthritis   . OA (osteoarthritis) of knee 08/05/2012  . Allergic rhinitis 03/29/2012  . Glaucoma 03/29/2012  . HTN (hypertension)   . DJD (degenerative joint disease)   . Ptosis of eyelid 04/18/2011     Laureen Abrahams, PT, DPT 05/19/19 10:50 AM    [facility] Physical Therapy [address] [place], [place], [address] Phone: [phone]   Fax:  [phone]  Name: DESTAN FRANCHINI MRN: [id number] Date of Birth: 10/29/1935

## 2019-05-21 ENCOUNTER — Encounter: Payer: Self-pay | Admitting: Physical Therapy

## 2019-05-21 ENCOUNTER — Ambulatory Visit (INDEPENDENT_AMBULATORY_CARE_PROVIDER_SITE_OTHER): Payer: Medicare Other | Admitting: Physical Therapy

## 2019-05-21 ENCOUNTER — Other Ambulatory Visit: Payer: Self-pay

## 2019-05-21 DIAGNOSIS — R293 Abnormal posture: Secondary | ICD-10-CM | POA: Diagnosis not present

## 2019-05-21 DIAGNOSIS — M6281 Muscle weakness (generalized): Secondary | ICD-10-CM | POA: Diagnosis not present

## 2019-05-21 DIAGNOSIS — M25511 Pain in right shoulder: Secondary | ICD-10-CM

## 2019-05-21 DIAGNOSIS — M542 Cervicalgia: Secondary | ICD-10-CM | POA: Diagnosis not present

## 2019-05-21 NOTE — Therapy (Addendum)
[facility] Physical Therapy [address] [place], [place], [address] Phone: [phone]   Fax:  [phone]  Physical Therapy Treatment/Discharge Summary  Patient Details  Name: [name] MRN: [id number] Date of Birth: 22-Sep-1935 Referring Provider (PT): Marlou Sa Tonna Corner, MD   Encounter Date: 05/21/2019  PT End of Session - 05/21/19 1139    Visit Number  7    Number of Visits  12    Date for PT Re-Evaluation  05/28/19    PT Start Time  1055    PT Stop Time  1135    PT Time Calculation (min)  40 min    Activity Tolerance  Patient tolerated treatment well    Behavior During Therapy  [facility] for tasks assessed/performed       Past Medical History:  Diagnosis Date  . Anemia   . Arthritis   . Complication of anesthesia    HARD TO WAKE UP  . Constipation   . DJD (degenerative joint disease)   . Glaucoma   . Headache    hx of  . Heart murmur   . History of kidney stones   . HTN (hypertension)   . Otitis media    with intermittent vertigo  . Pneumonia   . PONV (postoperative nausea and vomiting)   . Weakness    of neck muscles    Past Surgical History:  Procedure Laterality Date  . BACK SURGERY  2000  . Carpal Tunnel Left    03/2014  . CARPAL TUNNELL  1970'S  . EYE SURGERY  06/10/2009 / 07/17/12   glaucoma   . TONSILLECTOMY    . TOTAL KNEE ARTHROPLASTY Left 08/05/2012   Procedure: LEFT TOTAL KNEE ARTHROPLASTY;  Surgeon: Gearlean Alf, MD;  Location: [facility];  Service: Orthopedics;  Laterality: Left;  . TOTAL KNEE ARTHROPLASTY Right 09/25/2016   Procedure: RIGHT TOTAL KNEE ARTHROPLASTY;  Surgeon: Gaynelle Arabian, MD;  Location: [facility];  Service: Orthopedics;  Laterality: Right;    There were no vitals filed for this visit.  Subjective Assessment - 05/21/19 1055    Subjective  feels therapy is somewhat helpful.  reports decreased spasms and is able to move better without discomfort.    Patient Stated Goals  more mobile, turn head to Rt, hold  head up better    Currently in Pain?  No/denies    Pain Onset  More than a month ago                       [facility] Adult PT Treatment/Exercise - 05/21/19 1056      Neck Exercises: Machines for Strengthening   Nustep  L7 x 8 min; UE/LE      Neck Exercises: Standing   Other Standing Exercises  shoulder abduction flexion, overhead press with 2# bil 2x10    Other Standing Exercises  bicep curls 2x10 2#      Neck Exercises: Seated   Neck Retraction  20 reps;5 secs    Neck Retraction Limitations  ball against wall    Cervical Rotation  Both;20 reps    Lateral Flexion  Both;20 reps                  PT Long Term Goals - 04/16/19 1348      PT LONG TERM GOAL #1   Title  He will be able to hold head up with minimal to no discomfort for 3-5 min.     Status  New  Target Date  05/28/19      PT LONG TERM GOAL #2   Title  He will be able to do all HEP as of last visit    Status  New    Target Date  05/28/19      PT LONG TERM GOAL #3   Title  report Rt shoulder pain < 3/10 with activity for improved function    Status  New    Target Date  05/28/19      PT LONG TERM GOAL #4   Title  demonstrate at least 4/5 Rt shoulder strength for improved function    Status  New    Target Date  05/28/19      PT LONG TERM GOAL #5   Title  n/a            Plan - 05/21/19 1139    Clinical Impression Statement  Pt tolerated progression to standing exercises well today with expected fatigue.  Progressed to standing to work core and postural muscles.  Has MRI and MD visits scheduled before next PT session so will see what MD says and continue per recommendations.    Personal Factors and Comorbidities  Comorbidity 3+;Past/Current Experience;Time since onset of injury/illness/exacerbation    Comorbidities  arthritis, HTN, cervical dystonia, bil TKA    Examination-Activity Limitations  Sit;Stand;Hygiene/Grooming;Locomotion Level;Reach Overhead    Examination-Participation  Restrictions  Yard Work;Meal Prep;Cleaning    Stability/Clinical Decision Making  Evolving/Moderate complexity    Rehab Potential  Good    PT Frequency  2x / week    PT Duration  6 weeks    PT Treatment/Interventions  ADLs/Self Care Home Management;Cryotherapy;Electrical Stimulation;Moist Heat;Traction;Ultrasound;Functional mobility training;Therapeutic activities;Therapeutic exercise;Manual techniques;Patient/family education;Neuromuscular re-education;Passive range of motion;Dry needling;Taping;Vestibular    PT Next Visit Plan  continue with postural exercises; core and back strengthening; check goals and see what MD says/MRI results    PT Home Exercise Plan  Access Code: [email or web address]    Consulted and Agree with Plan of Care  Patient       Patient will benefit from skilled therapeutic intervention in order to improve the following deficits and impairments:  Impaired tone, Increased muscle spasms, Pain, Impaired UE functional use, Increased fascial restricitons, Decreased strength, Decreased range of motion, Impaired flexibility, Postural dysfunction  Visit Diagnosis: Cervicalgia  Acute pain of right shoulder  Abnormal posture  Muscle weakness (generalized)     Problem List Patient Active Problem List   Diagnosis Date Noted  . BPH (benign prostatic hyperplasia) 08/22/2017  . Arthralgia, neck 05/20/2014  . Arthritis   . OA (osteoarthritis) of knee 08/05/2012  . Allergic rhinitis 03/29/2012  . Glaucoma 03/29/2012  . HTN (hypertension)   . DJD (degenerative joint disease)   . Ptosis of eyelid 04/18/2011      Laureen Abrahams, PT, DPT 05/21/19 11:41 AM    [facility] Physical Therapy [address] [place], [place], [address] Phone: [phone]   Fax:  [phone]  Name: VITTORIO MOHS MRN: [id number] Date of Birth: 1935/04/04      PHYSICAL THERAPY DISCHARGE SUMMARY  Visits from Start of Care: 7  Current functional level related to goals /  functional outcomes: See above   Remaining deficits: See above; pt planning ACDF   Education / Equipment: HEP  Plan: Patient agrees to discharge.  Patient goals were not met. Patient is being discharged due to a change in medical status.  ?????     Laureen Abrahams, PT, DPT 08/05/19 9:01 AM  [facility] Physical Therapy [address] [place], [place], [address] Phone: [phone]   Fax:  [phone]

## 2019-05-22 ENCOUNTER — Other Ambulatory Visit: Payer: No Typology Code available for payment source

## 2019-05-23 ENCOUNTER — Ambulatory Visit
Admission: RE | Admit: 2019-05-23 | Discharge: 2019-05-23 | Disposition: A | Discharge status: Home / Self Care | Payer: Medicare Other | Source: Ambulatory Visit | Attending: Orthopedic Surgery | Admitting: Orthopedic Surgery

## 2019-05-23 ENCOUNTER — Other Ambulatory Visit: Payer: Self-pay

## 2019-05-23 DIAGNOSIS — M542 Cervicalgia: Secondary | ICD-10-CM

## 2019-05-28 ENCOUNTER — Other Ambulatory Visit: Payer: Self-pay

## 2019-05-28 ENCOUNTER — Encounter: Payer: No Typology Code available for payment source | Admitting: Physical Therapy

## 2019-05-28 ENCOUNTER — Telehealth: Payer: Self-pay

## 2019-05-28 ENCOUNTER — Ambulatory Visit (INDEPENDENT_AMBULATORY_CARE_PROVIDER_SITE_OTHER): Payer: Medicare Other | Admitting: Orthopedic Surgery

## 2019-05-28 ENCOUNTER — Encounter: Payer: Self-pay | Admitting: Orthopedic Surgery

## 2019-05-28 DIAGNOSIS — M542 Cervicalgia: Secondary | ICD-10-CM

## 2019-05-28 NOTE — Telephone Encounter (Signed)
Call patient to get him an appointment with Dr Ophelia Charter to discuss options for his neck.

## 2019-05-28 NOTE — Progress Notes (Signed)
Office Visit Note   Patient: [name]           Date of Birth: Jun 22, 1935           MRN: [id number] Visit Date: 05/28/2019 Requested by: Benay Pike, MD [phone],  [place] [address] PCP: Benay Pike, MD  Subjective: Chief Complaint  Patient presents with  . Follow-up    HPI: [name] is a patient here for MRI scan review of his cervical spine.  States he still has some weakness holding his head up for long period of time.  Reports some right arm and hand numbness and tingling in left hand numbness.  Muscle spasms are worse on the right.  Denies any muscle spasms on the left.  He does have spinal stenosis at C3-4 which has progressed since 2008.  He states he is not really stable when he is walking but it is more balance issue and not a weakness issue.  He has seen Dr. Rolena Infante before who recommended surgery but stated that he might be in pain the rest of his life after his neck surgery.  Not to use sure what to make of that.  Does report having a log hit him in his neck region several months ago but his symptoms have been progressing since then.              ROS: All systems reviewed are negative as they relate to the chief complaint within the history of present illness.  Patient denies  fevers or chills.   Assessment & Plan: Visit Diagnoses:  1. Cervicalgia     Plan: Impression is progressive spinal stenosis with some issues of him holding his head up.  He needs surgical consultation for opinion about whether or not surgery is indicated for his progressive spinal stenosis at C3-4.  The rest of the cervical spine levels look fairly reasonable.  No real upper motor neuron signs today.  Answer a lot of questions about his neck today.  I think some of these questions he has are better answered by the operating surgeon.  I will see him back as needed.  Follow-Up Instructions: No follow-ups on file.   Orders:  No orders of the defined types were placed in this  encounter.  No orders of the defined types were placed in this encounter.     Procedures: No procedures performed   Clinical Data: No additional findings.  Objective: Vital Signs: There were no vitals taken for this visit.  Physical Exam:   Constitutional: Patient appears well-developed HEENT:  Head: Normocephalic Eyes:EOM are abnormal Neck: Normal range of motion Cardiovascular: Normal rate Pulmonary/chest: Effort normal Neurologic: Patient is alert Skin: Skin is warm Psychiatric: Patient has normal mood and affect    Ortho Exam: Ortho exam demonstrates good ankle dorsiflexion plantarflexion quad hamstring strength bilaterally with no nerve root tension signs.  No hyperreflexia.  Negative Homans.  Negative Babinski.  No clonus.  Upper extremity exam demonstrates pretty reasonable grip EPL FPL interosseous wrist extra extension bicep triceps and deltoid strength.  Some paresthesias on the right in the C6 distribution compared to the left.  Neck range of motion is present but he does appear to have to exert a lot of effort to extend his neck.  Rotation is intact.  He does have more pain on the right-hand side when he rotates his head to the right.  Negative Hoffmann's.  Specialty Comments:  No specialty comments available.  Imaging: No  results found.   PMFS History: Patient Active Problem List   Diagnosis Date Noted  . BPH (benign prostatic hyperplasia) 08/22/2017  . Arthralgia, neck 05/20/2014  . Arthritis   . OA (osteoarthritis) of knee 08/05/2012  . Allergic rhinitis 03/29/2012  . Glaucoma 03/29/2012  . HTN (hypertension)   . DJD (degenerative joint disease)   . Ptosis of eyelid 04/18/2011   Past Medical History:  Diagnosis Date  . Anemia   . Arthritis   . Complication of anesthesia    HARD TO WAKE UP  . Constipation   . DJD (degenerative joint disease)   . Glaucoma   . Headache    hx of  . Heart murmur   . History of kidney stones   . HTN  (hypertension)   . Otitis media    with intermittent vertigo  . Pneumonia   . PONV (postoperative nausea and vomiting)   . Weakness    of neck muscles    Family History  Problem Relation Age of Onset  . Arthritis Mother   . Glaucoma Mother   . Hypertension Mother   . GER disease Father   . Hypertension Father   . Diabetes Brother   . Hypertension Brother   . ALS Brother   . Cancer Brother        unknown  . Dementia Brother   . Diabetes Daughter   . Diabetes Daughter   . Diabetes Son   . Dementia Sister   . Cancer Sister        unknown  . Heart disease Brother   . Hypertension Brother   . Hyperlipidemia Sister     Past Surgical History:  Procedure Laterality Date  . BACK SURGERY  2000  . Carpal Tunnel Left    03/2014  . CARPAL TUNNELL  1970'S  . EYE SURGERY  06/10/2009 / 07/17/12   glaucoma   . TONSILLECTOMY    . TOTAL KNEE ARTHROPLASTY Left 08/05/2012   Procedure: LEFT TOTAL KNEE ARTHROPLASTY;  Surgeon: Loanne Drilling, MD;  Location: [facility];  Service: Orthopedics;  Laterality: Left;  . TOTAL KNEE ARTHROPLASTY Right 09/25/2016   Procedure: RIGHT TOTAL KNEE ARTHROPLASTY;  Surgeon: Ollen Gross, MD;  Location: [facility];  Service: Orthopedics;  Laterality: Right;   Social History   Occupational History    Comment: rettired.  Tobacco Use  . Smoking status: Never Smoker  . Smokeless tobacco: Never Used  Substance and Sexual Activity  . Alcohol use: No    Alcohol/week: 0.0 standard drinks  . Drug use: No  . Sexual activity: Never

## 2019-05-29 NOTE — Telephone Encounter (Signed)
Can you please follow up with this patient? I tried calling him to get him scheduled with Dr Ophelia [address], no answer. No VM to LM.

## 2019-06-03 ENCOUNTER — Telehealth: Payer: Self-pay | Admitting: Physical Therapy

## 2019-06-03 ENCOUNTER — Encounter (HOSPITAL_COMMUNITY): Payer: Self-pay | Admitting: Emergency Medicine

## 2019-06-03 ENCOUNTER — Ambulatory Visit (HOSPITAL_COMMUNITY)
Admission: EM | Admit: 2019-06-03 | Discharge: 2019-06-03 | Disposition: A | Discharge status: Home / Self Care | Payer: No Typology Code available for payment source

## 2019-06-03 ENCOUNTER — Emergency Department (HOSPITAL_COMMUNITY): Payer: Medicare Other

## 2019-06-03 ENCOUNTER — Other Ambulatory Visit: Payer: Self-pay

## 2019-06-03 ENCOUNTER — Encounter: Payer: No Typology Code available for payment source | Admitting: Physical Therapy

## 2019-06-03 ENCOUNTER — Emergency Department (HOSPITAL_COMMUNITY)
Admission: EM | Admit: 2019-06-03 | Discharge: 2019-06-03 | Disposition: A | Discharge status: Home / Self Care | Payer: Medicare Other | Attending: Emergency Medicine | Admitting: Emergency Medicine

## 2019-06-03 DIAGNOSIS — M62838 Other muscle spasm: Secondary | ICD-10-CM | POA: Insufficient documentation

## 2019-06-03 DIAGNOSIS — Y92009 Unspecified place in unspecified non-institutional (private) residence as the place of occurrence of the external cause: Secondary | ICD-10-CM | POA: Diagnosis not present

## 2019-06-03 DIAGNOSIS — Y999 Unspecified external cause status: Secondary | ICD-10-CM | POA: Diagnosis not present

## 2019-06-03 DIAGNOSIS — W19XXXA Unspecified fall, initial encounter: Secondary | ICD-10-CM | POA: Insufficient documentation

## 2019-06-03 DIAGNOSIS — M542 Cervicalgia: Secondary | ICD-10-CM | POA: Insufficient documentation

## 2019-06-03 DIAGNOSIS — Z79899 Other long term (current) drug therapy: Secondary | ICD-10-CM | POA: Insufficient documentation

## 2019-06-03 DIAGNOSIS — S0990XA Unspecified injury of head, initial encounter: Secondary | ICD-10-CM | POA: Diagnosis not present

## 2019-06-03 DIAGNOSIS — I10 Essential (primary) hypertension: Secondary | ICD-10-CM | POA: Diagnosis not present

## 2019-06-03 DIAGNOSIS — Y939 Activity, unspecified: Secondary | ICD-10-CM | POA: Diagnosis not present

## 2019-06-03 LAB — BASIC METABOLIC PANEL
Anion gap: 10 (ref 5–15)
BUN: 10 mg/dL (ref 8–23)
CO2: 25 mmol/L (ref 22–32)
Calcium: 9.4 mg/dL (ref 8.9–10.3)
Chloride: 107 mmol/L (ref 98–111)
Creatinine, Ser: 0.92 mg/dL (ref 0.61–1.24)
GFR calc Af Amer: >60 mL/min (ref >60)
GFR calc non Af Amer: >60 mL/min (ref >60)
Glucose, Bld: 98 mg/dL (ref 70–99)
Potassium: 3.7 mmol/L (ref 3.5–5.1)
Sodium: 142 mmol/L (ref 135–145)

## 2019-06-03 LAB — CBC
HCT: 38 % — ABNORMAL LOW (ref 39.0–52.0)
Hemoglobin: 13.2 g/dL (ref 13.0–17.0)
MCH: 31.5 pg (ref 26.0–34.0)
MCHC: 34.7 g/dL (ref 30.0–36.0)
MCV: 90.7 fL (ref 80.0–100.0)
Platelets: 172 10*3/uL (ref 150–400)
RBC: 4.19 MIL/uL — ABNORMAL LOW (ref 4.22–5.81)
RDW: 14 % (ref 11.5–15.5)
WBC: 5.8 10*3/uL (ref 4.0–10.5)
nRBC: 0 % (ref 0.0–0.2)

## 2019-06-03 LAB — CBG MONITORING, ED: Glucose-Capillary: 83 mg/dL (ref 70–99)

## 2019-06-03 MED ORDER — DIAZEPAM 5 MG PO TABS: 5.0000 mg | ORAL_TABLET | Freq: Two times a day (BID) | ORAL | 0 refills | Status: DC

## 2019-06-03 MED ORDER — SODIUM CHLORIDE 0.9% FLUSH: 3.0000 mL | Freq: Once | INTRAVENOUS | Status: DC

## 2019-06-03 MED ORDER — ACETAMINOPHEN 500 MG PO TABS
1000.0000 mg | ORAL_TABLET | Freq: Once | ORAL | Status: AC
  Administered 2019-06-03: 1000 mg via ORAL
  Filled 2019-06-03: qty 2

## 2019-06-03 NOTE — Telephone Encounter (Signed)
Pt NS for appt today.  He reports Dr. August Saucer advised to wait until after he sees Dr. Ophelia Charter which is scheduled for next week.  Also reports he had a fall last night and "I'm really sore."  Recommended he call PCP or go to Urgent Care if he needs medical attention.    Clarita Crane, PT, DPT 06/03/19 10:53 AM

## 2019-06-03 NOTE — ED Notes (Signed)
Patient is being discharged from the Urgent Care Center and sent to the Emergency Department via wheelchair by staff. Per Provider Wallis Bamberg, patient is stable but in need of higher level of care due to significance of fall. Patient is aware and verbalizes understanding of plan of care. There were no vitals filed for this visit.

## 2019-06-03 NOTE — ED Provider Notes (Signed)
[facility] EMERGENCY DEPARTMENT Provider Note   CSN: [id number] Arrival date & time: 06/03/19  1831     History Chief Complaint  Patient presents with  . Fall    [name] is a 84 y.o. male.  Pt presents to the ED today with neck pain.  The pt fell yesterday while bending over to pick something up.  Pt said he hit his head, but did not have a loc.  He said his neck has been very stiff today.  It hurts to move it and when he does, it cracks.  Pt did have some baclofen at home which did help.  Pt denies any n/v.  No dizziness.  He drove here.           Past Medical History:  Diagnosis Date  . Anemia   . Arthritis   . Complication of anesthesia    HARD TO WAKE UP  . Constipation   . DJD (degenerative joint disease)   . Glaucoma   . Headache    hx of  . Heart murmur   . History of kidney stones   . HTN (hypertension)   . Otitis media    with intermittent vertigo  . Pneumonia   . PONV (postoperative nausea and vomiting)   . Weakness    of neck muscles    Patient Active Problem List   Diagnosis Date Noted  . BPH (benign prostatic hyperplasia) 08/22/2017  . Arthralgia, neck 05/20/2014  . Arthritis   . OA (osteoarthritis) of knee 08/05/2012  . Allergic rhinitis 03/29/2012  . Glaucoma 03/29/2012  . HTN (hypertension)   . DJD (degenerative joint disease)   . Ptosis of eyelid 04/18/2011    Past Surgical History:  Procedure Laterality Date  . BACK SURGERY  2000  . Carpal Tunnel Left    03/2014  . CARPAL TUNNELL  1970'S  . EYE SURGERY  06/10/2009 / 07/17/12   glaucoma   . TONSILLECTOMY    . TOTAL KNEE ARTHROPLASTY Left 08/05/2012   Procedure: LEFT TOTAL KNEE ARTHROPLASTY;  Surgeon: Loanne Drilling, MD;  Location: [facility];  Service: Orthopedics;  Laterality: Left;  . TOTAL KNEE ARTHROPLASTY Right 09/25/2016   Procedure: RIGHT TOTAL KNEE ARTHROPLASTY;  Surgeon: Ollen Gross, MD;  Location: [facility];  Service: Orthopedics;  Laterality: Right;        Family History  Problem Relation Age of Onset  . Arthritis Mother   . Glaucoma Mother   . Hypertension Mother   . GER disease Father   . Hypertension Father   . Diabetes Brother   . Hypertension Brother   . ALS Brother   . Cancer Brother        unknown  . Dementia Brother   . Diabetes Daughter   . Diabetes Daughter   . Diabetes Son   . Dementia Sister   . Cancer Sister        unknown  . Heart disease Brother   . Hypertension Brother   . Hyperlipidemia Sister     Social History   Tobacco Use  . Smoking status: Never Smoker  . Smokeless tobacco: Never Used  Substance Use Topics  . Alcohol use: No    Alcohol/week: 0.0 standard drinks  . Drug use: No    Home Medications Prior to Admission medications   Medication Sig Start Date End Date Taking? Authorizing Provider  acetaminophen (TYLENOL) 500 MG tablet Take 1,000 mg by mouth every 6 (six) hours as needed  for mild pain or moderate pain.     [provider]  amLODipine (NORVASC) 10 MG tablet Take 10 mg by mouth daily.    [provider]  atenolol (TENORMIN) 50 MG tablet Take 25 mg by mouth 2 (two) times daily.     [provider]  baclofen (LIORESAL) 10 MG tablet Take 10 mg by mouth 3 (three) times daily as needed for muscle spasms.    [provider]  brimonidine (ALPHAGAN) 0.2 % ophthalmic solution Place 1 drop into both eyes 2 (two) times daily.     [provider]  diazepam (VALIUM) 5 MG tablet Take 1 tablet (5 mg total) by mouth 2 (two) times daily. 06/03/19   Jacalyn Lefevre, MD  dorzolamide-timolol (COSOPT) 22.3-6.8 MG/ML ophthalmic solution Place 1 drop into both eyes 2 (two) times daily.     [provider]  doxazosin (CARDURA) 8 MG tablet Take 8 mg by mouth at bedtime.     [provider]  finasteride (PROSCAR) 5 MG tablet Take 5 mg by mouth at bedtime.     [provider]  losartan-hydrochlorothiazide (HYZAAR) 100-25 MG per tablet  Take 1 tablet by mouth daily. If BP is below 139 will not take med. If BP is 140 - takes med    [provider]  prednisoLONE acetate (PRED FORTE) 1 % ophthalmic suspension Place 1 drop into both eyes daily.    [provider]    Allergies    Celebrex [celecoxib], Ivp dye [iodinated diagnostic agents], Bextra [valdecoxib], Chlorpromazine, Ciprofloxacin, Cyclopentolate, Flomax [tamsulosin hcl], Hyoscyamine, Imuran [azathioprine], Methotrexate, Methotrexate derivatives, Nasal spray, and Penicillins  Review of Systems   Review of Systems  Musculoskeletal: Positive for neck pain.  All other systems reviewed and are negative.   Physical Exam Updated Vital Signs BP (!) 188/77 Comment: Pt takes BP meds  Pulse (!) 56   Temp 97.9 F (36.6 C)   Resp 12   Ht 6\' 2"  (1.88 m)   Wt 92.5 kg   SpO2 100%   BMI 26.19 kg/m   Physical Exam Vitals and nursing note reviewed.  Constitutional:      Appearance: Normal appearance.  HENT:     Head: Normocephalic and atraumatic.     Right Ear: External ear normal.     Left Ear: External ear normal.     Nose: Nose normal.     Mouth/Throat:     Mouth: Mucous membranes are moist.     Pharynx: Oropharynx is clear.  Eyes:     Comments: Large cataracts right eye.  Very poor vision, but chronic.  Neck:     Comments: Bilateral paraspinal muscle spasm.  Decreased rom. Cardiovascular:     Rate and Rhythm: Normal rate and regular rhythm.     Pulses: Normal pulses.     Heart sounds: Normal heart sounds.  Pulmonary:     Effort: Pulmonary effort is normal.     Breath sounds: Normal breath sounds.  Abdominal:     General: Abdomen is flat. Bowel sounds are normal.     Palpations: Abdomen is soft.  Musculoskeletal:        General: Normal range of motion.     Cervical back: Pain with movement and muscular tenderness present.  Skin:    General: Skin is warm.     Capillary Refill: Capillary refill takes less than 2 seconds.  Neurological:      General: No focal deficit present.     Mental Status:  He is alert and oriented to person, place, and time.  Psychiatric:        Mood and Affect: Mood normal.        Behavior: Behavior normal.        Thought Content: Thought content normal.        Judgment: Judgment normal.     ED Results / Procedures / Treatments   Labs (all labs ordered are listed, but only abnormal results are displayed) Labs Reviewed  CBC - Abnormal; Notable for the following components:      Result Value   RBC 4.19 (*)    HCT 38.0 (*)    All other components within normal limits  BASIC METABOLIC PANEL  CBG MONITORING, ED    EKG None  Radiology CT HEAD WO CONTRAST  Result Date: 06/03/2019 CLINICAL DATA:  Status post fall. EXAM: CT HEAD WITHOUT CONTRAST TECHNIQUE: Contiguous axial images were obtained from the base of the skull through the vertex without intravenous contrast. COMPARISON:  April 04, 2019 FINDINGS: Brain: There is mild cerebral atrophy with widening of the extra-axial spaces and ventricular dilatation. There are areas of decreased attenuation within the white matter tracts of the supratentorial brain, consistent with microvascular disease changes. Vascular: No hyperdense vessel or unexpected calcification. Skull: Normal. Negative for fracture or focal lesion. Sinuses/Orbits: No acute finding. Other: There is mild right periorbital soft tissue swelling. IMPRESSION: 1. Generalized cerebral atrophy. 2. No acute intracranial abnormality. Electronically Signed   By: Virgina Norfolk M.D.   On: 06/03/2019 19:30   CT Cervical Spine Wo Contrast  Result Date: 06/03/2019 CLINICAL DATA:  Head trauma secondary to a fall yesterday. Neck pain and stiffness. EXAM: CT CERVICAL SPINE WITHOUT CONTRAST TECHNIQUE: Multidetector CT imaging of the cervical spine was performed without intravenous contrast. Multiplanar CT image reconstructions were also generated. COMPARISON:  CT scan dated 11/07/2018 FINDINGS:  Alignment: Slight chronic reversal of the lower cervical lordosis. Skull base and vertebrae: No fracture or abnormal subluxation. Osteophytes fuse C5 through T3. Soft tissues and spinal canal: No prevertebral fluid or swelling. No visible canal hematoma. Stable small bilateral thyroid nodules. No follow-up recommended. Disc levels: C2-3: Normal disc. Moderate right facet arthritis. No foraminal stenosis. C3-4: Disc degeneration. No disc bulging or protrusion. Slight left facet arthritis. No foraminal stenosis. C4-5: Tiny broad-based disc bulge with no neural impingement. Moderate left facet arthritis. No foraminal stenosis. C5-6 through T2-3: Anterior osteophytes fuse each level. No disc protrusions. No spinal or foraminal stenosis except at T1-2 where a disc osteophyte complex slightly narrows the AP dimension of the spinal canal, unchanged since 11/07/2018. Upper chest: Negative. Other: None IMPRESSION: 1. No acute abnormality of the cervical spine. 2. Diffuse degenerative disc and joint disease. Osteophytes fuse C5 through at least T2-3. 3. No change in the appearance of the cervical spine since 11/07/2018. Electronically Signed   By: Lorriane Shire M.D.   On: 06/03/2019 19:37    Procedures Procedures (including critical care time)  Medications Ordered in ED Medications  sodium chloride flush (NS) 0.9 % injection 3 mL (has no administration in time range)  acetaminophen (TYLENOL) tablet 1,000 mg (1,000 mg Oral Given 06/03/19 2222)    ED Course  I have reviewed the triage vital signs and the nursing notes.  Pertinent labs & imaging results that were available during my care of the patient were reviewed by me and considered in my medical decision making (see chart for details).    MDM Rules/Calculators/A&P  CT scans negative.  Pt is able to ambulate.  Pt is given a rx for valium to take for spasm.  He knows to return if worse.  F/u with pcp.  Final Clinical Impression(s) /  ED Diagnoses Final diagnoses:  Spasm of cervical paraspinous muscle    Rx / DC Orders ED Discharge Orders         Ordered    diazepam (VALIUM) 5 MG tablet  2 times daily,   Status:  Discontinued     06/03/19 2146    diazepam (VALIUM) 5 MG tablet  2 times daily     06/03/19 2229           Jacalyn Lefevre, MD 06/03/19 2313

## 2019-06-03 NOTE — ED Triage Notes (Signed)
Pt arrives to ED from urgent care with complaints of a fall yesterday evening while bending over to pick something up. Patient states he did not lose consciousness but struck the left side of his head. Patient states that today his neck is stiff and sore and his really hurting him. Patient denies weakness, speech or vision difficulties.

## 2019-06-05 ENCOUNTER — Encounter: Payer: No Typology Code available for payment source | Admitting: Rehabilitative and Restorative Service Providers"

## 2019-06-11 ENCOUNTER — Other Ambulatory Visit: Payer: Self-pay

## 2019-06-11 ENCOUNTER — Encounter: Payer: Self-pay | Admitting: Orthopaedic Surgery

## 2019-06-11 ENCOUNTER — Ambulatory Visit (INDEPENDENT_AMBULATORY_CARE_PROVIDER_SITE_OTHER): Payer: Medicare Other | Admitting: Orthopaedic Surgery

## 2019-06-11 DIAGNOSIS — M4802 Spinal stenosis, cervical region: Secondary | ICD-10-CM

## 2019-06-11 NOTE — Progress Notes (Signed)
Office Visit Note   Patient: [name]           Date of Birth: Nov 05, 1935           MRN: [id number] Visit Date: 06/11/2019              Requested by: Sandre Kitty, MD [phone] [address] [place],  [place] [address] PCP: Gwenyth Bender, MD   Assessment & Plan: Visit Diagnoses:  1. Spinal stenosis of cervical region     Plan: We reviewed findings of his MRI scan images as well as the report.  He has got moderate stenosis at C3-4 and mild to moderate changes at other levels including C4-5.  Mild stenosis at C6-7.  We discussed some of the surgical options.  At present he does not have a myelopathic gait and no long track signs currently.  I will recheck him again in 1 month.  Follow-Up Instructions: Return in about 1 month (around 07/11/2019).   Orders:  No orders of the defined types were placed in this encounter.  No orders of the defined types were placed in this encounter.     Procedures: No procedures performed   Clinical Data: No additional findings.   Subjective: Chief Complaint  Patient presents with  . Neck - Pain    HPI 84 year old male who is seen Dr. Dorene Grebe is here for evaluation of cervical spondylosis after MRI scan.  Patient states he has neck pain worse in the right side than left side.  Pain radiates into his head he has noted some intermittent pain in his right shoulder.  It does not keep him awake.  Patient is spinal stenosis at C3-4 which is progressed from 2008.  He has not had any good gait disturbance that is wide-based.  Has a slow shuffle type gait.  Negative for falls.  Negative for lower extremity weakness.  Patient's principal problem is difficulty holding his head up.  He states with neck extension he has increased pain.  Positive for an episode of falling on his leaning over to pick something up he lost his balance and struck his head.  CT scan 06/03/2019 cervical spine negative for acute changes.  Spondylosis and some kyphosis with facet  arthropathy was again noted.  Review of Systems 14 point systems positive for seasonal allergies.  Positive cervical stenosis, hypertension right eye ptosis.  Otherwise negative is obtains HPI.   Objective: Vital Signs: BP 139/66   Pulse (!) 54   Ht 6\' 3"  (1.905 m)   Wt 204 lb (92.5 kg)   BMI 25.50 kg/m   Physical Exam Constitutional:      Appearance: He is well-developed.  HENT:     Head: Normocephalic and atraumatic.  Eyes:     Comments: Ptosis right eye.  Neck:     Thyroid: No thyromegaly.     Trachea: No tracheal deviation.  Cardiovascular:     Rate and Rhythm: Normal rate.  Pulmonary:     Effort: Pulmonary effort is normal.     Breath sounds: No wheezing.  Abdominal:     General: Bowel sounds are normal.     Palpations: Abdomen is soft.  Skin:    General: Skin is warm and dry.     Capillary Refill: Capillary refill takes less than 2 seconds.  Neurological:     Mental Status: He is alert and oriented to person, place, and time.  Psychiatric:        Behavior: Behavior  normal.        Thought Content: Thought content normal.        Judgment: Judgment normal.     Ortho Exam patient increased pain with cervical extension.  He has some brachial plexus tenderness both right and left.  No lower extremity clonus no lower extremity hyperreflexia.  Biceps triceps brachial radialis reflexes are 2+ and symmetrical.  Specialty Comments:  No specialty comments available.  Imaging: CLINICAL DATA:  Cervicalgia  EXAM: MRI CERVICAL SPINE WITHOUT CONTRAST  TECHNIQUE: Multiplanar, multisequence MR imaging of the cervical spine was performed. No intravenous contrast was administered.  COMPARISON:  CT cervical spine 11/07/2018. MRI cervical spine 03/08/2006  FINDINGS: Alignment: Normal alignment.  Moderate kyphosis at C5-6  Vertebrae: Negative for fracture or mass  Cord: Patchy cord signal on inversion recovery felt to be artifact not confirmed on other images.  No cord lesion identified.  Posterior Fossa, vertebral arteries, paraspinal tissues: Negative  Disc levels:  C2-3: Central disc protrusion with mild spinal stenosis. Bilateral facet degeneration  C3-4: Disc degeneration with central disc protrusion and diffuse uncinate spurring. Moderate facet degeneration. Cord flattening with moderate spinal stenosis and moderate foraminal stenosis bilaterally. Spinal stenosis has progressed significantly since 2008.  C4-5: Disc degeneration with diffuse uncinate spurring right greater than left. Cord flattening with mild to moderate spinal stenosis. Moderate foraminal stenosis bilaterally. Bilateral facet degeneration.  C5-6: Disc degeneration with uncinate spurring. Mild spinal stenosis and mild foraminal stenosis bilaterally.  C6-7: Disc degeneration with diffuse uncinate spurring. Mild spinal stenosis and mild foraminal stenosis bilaterally  C7-T1: Mild disc degeneration without stenosis  T1-2: Central disc protrusion with endplate spurring. Mild foraminal stenosis bilaterally.  IMPRESSION: Multilevel degenerative change throughout the cervical spine. Overall progression since 2008. Multilevel spinal and foraminal stenosis as above.   Electronically Signed   By: Marlan Palau M.D.   On: 05/23/2019 15:50   PMFS History: Patient Active Problem List   Diagnosis Date Noted  . Spinal stenosis of cervical region 06/12/2019  . BPH (benign prostatic hyperplasia) 08/22/2017  . Arthralgia, neck 05/20/2014  . Arthritis   . OA (osteoarthritis) of knee 08/05/2012  . Allergic rhinitis 03/29/2012  . Glaucoma 03/29/2012  . HTN (hypertension)   . DJD (degenerative joint disease)   . Ptosis of eyelid 04/18/2011   Past Medical History:  Diagnosis Date  . Anemia   . Arthritis   . Complication of anesthesia    HARD TO WAKE UP  . Constipation   . DJD (degenerative joint disease)   . Glaucoma   . Headache    hx of  .  Heart murmur   . History of kidney stones   . HTN (hypertension)   . Otitis media    with intermittent vertigo  . Pneumonia   . PONV (postoperative nausea and vomiting)   . Weakness    of neck muscles    Family History  Problem Relation Age of Onset  . Arthritis Mother   . Glaucoma Mother   . Hypertension Mother   . GER disease Father   . Hypertension Father   . Diabetes Brother   . Hypertension Brother   . ALS Brother   . Cancer Brother        unknown  . Dementia Brother   . Diabetes Daughter   . Diabetes Daughter   . Diabetes Son   . Dementia Sister   . Cancer Sister        unknown  . Heart disease Brother   .  Hypertension Brother   . Hyperlipidemia Sister     Past Surgical History:  Procedure Laterality Date  . BACK SURGERY  2000  . Carpal Tunnel Left    03/2014  . CARPAL TUNNELL  1970'S  . EYE SURGERY  06/10/2009 / 07/17/12   glaucoma   . TONSILLECTOMY    . TOTAL KNEE ARTHROPLASTY Left 08/05/2012   Procedure: LEFT TOTAL KNEE ARTHROPLASTY;  Surgeon: Gearlean Alf, MD;  Location: [facility];  Service: Orthopedics;  Laterality: Left;  . TOTAL KNEE ARTHROPLASTY Right 09/25/2016   Procedure: RIGHT TOTAL KNEE ARTHROPLASTY;  Surgeon: Gaynelle Arabian, MD;  Location: [facility];  Service: Orthopedics;  Laterality: Right;   Social History   Occupational History    Comment: rettired.  Tobacco Use  . Smoking status: Never Smoker  . Smokeless tobacco: Never Used  Substance and Sexual Activity  . Alcohol use: No    Alcohol/week: 0.0 standard drinks  . Drug use: No  . Sexual activity: Never

## 2019-06-12 DIAGNOSIS — M4802 Spinal stenosis, cervical region: Secondary | ICD-10-CM | POA: Insufficient documentation

## 2019-07-11 ENCOUNTER — Encounter: Payer: Self-pay | Admitting: Orthopaedic Surgery

## 2019-07-11 ENCOUNTER — Ambulatory Visit (INDEPENDENT_AMBULATORY_CARE_PROVIDER_SITE_OTHER): Payer: Medicare Other | Admitting: Orthopaedic Surgery

## 2019-07-11 ENCOUNTER — Other Ambulatory Visit: Payer: Self-pay

## 2019-07-11 VITALS — BP 121/57 | HR 65 | Ht 75.0 in | Wt 204.0 lb

## 2019-07-11 DIAGNOSIS — M4802 Spinal stenosis, cervical region: Secondary | ICD-10-CM

## 2019-07-11 NOTE — Progress Notes (Signed)
Office Visit Note   Patient: [name]           Date of Birth: 10/31/1935           MRN: [id number] Visit Date: 07/11/2019              Requested by: Gwenyth Bender, MD [address] [place],  [place] [address] PCP: Gwenyth Bender, MD   Assessment & Plan: Visit Diagnoses: No diagnosis found.  Plan: Patient with increased neck pain with moderate stenosis at C3-4 and C4-5.  He has had some episodes with falling.  Patient does have ankylosing spondylitis.  We discussed surgical options.  Plan C3-4 C4-5 anterior cervical discectomy allograft and fusion.  Procedure discussed risk surgery discussed he understands and requests to proceed.  We discussed pseudoarthrosis progressive spondylosis problems with adjacent levels potential in the future.  Questions were elicited and answered.  Follow-Up Instructions: No follow-ups on file.   Orders:  No orders of the defined types were placed in this encounter.  No orders of the defined types were placed in this encounter.     Procedures: No procedures performed   Clinical Data: No additional findings.   Subjective: Chief Complaint  Patient presents with  . Neck - Follow-up    HPI 84 year old male returns with ongoing problems with neck pain now with progressive gait problems.  He had an episode where he leaned forward fell lost his balance hit his head was seen in the emergency room with CT scan showing fusion from C5-6 at least T3 level from ankylosis with anterior bridging spurs.  MRI scan 05/23/2019 showed cord flattening and moderate stenosis with foraminal stenosis at C3-4 and also C4-5.  Patient states she is having problems walking is not noticed any weakness in his hands but is concerned about his gait having to grab objects to avoid falling.  He has pain when he rotates his neck is noted decreased range of motion.  Review of Systems positive for right eye ptosis decreased vision good vision left eye.  Positive cervical stenosis  cervical and upper thoracic ankylosis, hypertension.  Positive for BPH without progressive symptoms.   Objective: Vital Signs: BP (!) 121/57   Pulse 65   Ht 6\' 3"  (1.905 m)   Wt 204 lb (92.5 kg)   BMI 25.50 kg/m   Physical Exam Constitutional:      Appearance: He is well-developed.  HENT:     Head: Normocephalic and atraumatic.  Eyes:     Pupils: Pupils are equal, round, and reactive to light.  Neck:     Thyroid: No thyromegaly.     Trachea: No tracheal deviation.  Cardiovascular:     Rate and Rhythm: Normal rate.  Pulmonary:     Effort: Pulmonary effort is normal.     Breath sounds: No wheezing.  Abdominal:     General: Bowel sounds are normal.     Palpations: Abdomen is soft.  Skin:    General: Skin is warm and dry.     Capillary Refill: Capillary refill takes less than 2 seconds.  Neurological:     Mental Status: He is alert and oriented to person, place, and time.  Psychiatric:        Behavior: Behavior normal.        Thought Content: Thought content normal.        Judgment: Judgment normal.     Ortho Exam patient is limited to cervical flexion 5 fingerbreadths chin to chest.  Sharp pain with extension.  He has some brachial plexus tenderness moderate both right and left.  Biceps triceps brachial radialis are 2+ and symmetrical.  Slight deltoid weakness right and left which is symmetrical.  Patient has slight wide base gait.  3+ lower extremity reflexes without clonus.positive Lhermitte.  Specialty Comments:  No specialty comments available.  Imaging: CLINICAL DATA:  Cervicalgia  EXAM: MRI CERVICAL SPINE WITHOUT CONTRAST  TECHNIQUE: Multiplanar, multisequence MR imaging of the cervical spine was performed. No intravenous contrast was administered.  COMPARISON:  CT cervical spine 11/07/2018. MRI cervical spine 03/08/2006  FINDINGS: Alignment: Normal alignment.  Moderate kyphosis at C5-6  Vertebrae: Negative for fracture or mass  Cord: Patchy  cord signal on inversion recovery felt to be artifact not confirmed on other images. No cord lesion identified.  Posterior Fossa, vertebral arteries, paraspinal tissues: Negative  Disc levels:  C2-3: Central disc protrusion with mild spinal stenosis. Bilateral facet degeneration  C3-4: Disc degeneration with central disc protrusion and diffuse uncinate spurring. Moderate facet degeneration. Cord flattening with moderate spinal stenosis and moderate foraminal stenosis bilaterally. Spinal stenosis has progressed significantly since 2008.  C4-5: Disc degeneration with diffuse uncinate spurring right greater than left. Cord flattening with mild to moderate spinal stenosis. Moderate foraminal stenosis bilaterally. Bilateral facet degeneration.  C5-6: Disc degeneration with uncinate spurring. Mild spinal stenosis and mild foraminal stenosis bilaterally.  C6-7: Disc degeneration with diffuse uncinate spurring. Mild spinal stenosis and mild foraminal stenosis bilaterally  C7-T1: Mild disc degeneration without stenosis  T1-2: Central disc protrusion with endplate spurring. Mild foraminal stenosis bilaterally.  IMPRESSION: Multilevel degenerative change throughout the cervical spine. Overall progression since 2008. Multilevel spinal and foraminal stenosis as above.   Electronically Signed   By: Marlan Palau M.D.   On: 05/23/2019 15:50 CLINICAL DATA:  Head trauma secondary to a fall yesterday. Neck pain and stiffness.  EXAM: CT CERVICAL SPINE WITHOUT CONTRAST  TECHNIQUE: Multidetector CT imaging of the cervical spine was performed without intravenous contrast. Multiplanar CT image reconstructions were also generated.  COMPARISON:  CT scan dated 11/07/2018  FINDINGS: Alignment: Slight chronic reversal of the lower cervical lordosis.  Skull base and vertebrae: No fracture or abnormal subluxation. Osteophytes fuse C5 through T3.  Soft tissues and  spinal canal: No prevertebral fluid or swelling. No visible canal hematoma. Stable small bilateral thyroid nodules. No follow-up recommended.  Disc levels: C2-3: Normal disc. Moderate right facet arthritis. No foraminal stenosis.  C3-4: Disc degeneration. No disc bulging or protrusion. Slight left facet arthritis. No foraminal stenosis.  C4-5: Tiny broad-based disc bulge with no neural impingement. Moderate left facet arthritis. No foraminal stenosis.  C5-6 through T2-3: Anterior osteophytes fuse each level. No disc protrusions. No spinal or foraminal stenosis except at T1-2 where a disc osteophyte complex slightly narrows the AP dimension of the spinal canal, unchanged since 11/07/2018.  Upper chest: Negative.  Other: None  IMPRESSION: 1. No acute abnormality of the cervical spine. 2. Diffuse degenerative disc and joint disease. Osteophytes fuse C5 through at least T2-3. 3. No change in the appearance of the cervical spine since 11/07/2018.   Electronically Signed   By: Francene Boyers M.D.   On: 06/03/2019 19:37   PMFS History: Patient Active Problem List   Diagnosis Date Noted  . Spinal stenosis of cervical region 06/12/2019  . BPH (benign prostatic hyperplasia) 08/22/2017  . Arthralgia, neck 05/20/2014  . Arthritis   . OA (osteoarthritis) of knee 08/05/2012  . Allergic rhinitis 03/29/2012  . Glaucoma  03/29/2012  . HTN (hypertension)   . DJD (degenerative joint disease)   . Ptosis of eyelid 04/18/2011   Past Medical History:  Diagnosis Date  . Anemia   . Arthritis   . Complication of anesthesia    HARD TO WAKE UP  . Constipation   . DJD (degenerative joint disease)   . Glaucoma   . Headache    hx of  . Heart murmur   . History of kidney stones   . HTN (hypertension)   . Otitis media    with intermittent vertigo  . Pneumonia   . PONV (postoperative nausea and vomiting)   . Weakness    of neck muscles    Family History  Problem Relation  Age of Onset  . Arthritis Mother   . Glaucoma Mother   . Hypertension Mother   . GER disease Father   . Hypertension Father   . Diabetes Brother   . Hypertension Brother   . ALS Brother   . Cancer Brother        unknown  . Dementia Brother   . Diabetes Daughter   . Diabetes Daughter   . Diabetes Son   . Dementia Sister   . Cancer Sister        unknown  . Heart disease Brother   . Hypertension Brother   . Hyperlipidemia Sister     Past Surgical History:  Procedure Laterality Date  . BACK SURGERY  2000  . Carpal Tunnel Left    03/2014  . CARPAL TUNNELL  1970'S  . EYE SURGERY  06/10/2009 / 07/17/12   glaucoma   . TONSILLECTOMY    . TOTAL KNEE ARTHROPLASTY Left 08/05/2012   Procedure: LEFT TOTAL KNEE ARTHROPLASTY;  Surgeon: Gearlean Alf, MD;  Location: [facility];  Service: Orthopedics;  Laterality: Left;  . TOTAL KNEE ARTHROPLASTY Right 09/25/2016   Procedure: RIGHT TOTAL KNEE ARTHROPLASTY;  Surgeon: Gaynelle Arabian, MD;  Location: [facility];  Service: Orthopedics;  Laterality: Right;   Social History   Occupational History    Comment: rettired.  Tobacco Use  . Smoking status: Never Smoker  . Smokeless tobacco: Never Used  Substance and Sexual Activity  . Alcohol use: No    Alcohol/week: 0.0 standard drinks  . Drug use: No  . Sexual activity: Never

## 2020-01-26 ENCOUNTER — Other Ambulatory Visit: Payer: No Typology Code available for payment source

## 2020-08-23 ENCOUNTER — Ambulatory Visit: Payer: Self-pay

## 2020-08-23 ENCOUNTER — Ambulatory Visit: Payer: Medicare Other | Attending: Internal Medicine

## 2020-08-23 ENCOUNTER — Other Ambulatory Visit (HOSPITAL_BASED_OUTPATIENT_CLINIC_OR_DEPARTMENT_OTHER): Payer: Self-pay

## 2020-08-23 DIAGNOSIS — Z23 Encounter for immunization: Secondary | ICD-10-CM

## 2020-08-23 MED ORDER — PFIZER-BIONT COVID-19 VAC-TRIS 30 MCG/0.3ML IM SUSP
INTRAMUSCULAR | 0 refills | Status: DC
  Filled 2020-08-23: qty 0.3, 1d supply, fill #0

## 2020-08-23 NOTE — Progress Notes (Signed)
   Covid-19 Vaccination Clinic  Name:  [name]    MRN: [id number] DOB: [birth year]  08/23/2020  Mr. [name] was observed post Covid-19 immunization for 15 minutes without incident. He was provided with Vaccine Information Sheet and instruction to access the V-Safe system.   Mr. [name] was instructed to call 911 with any severe reactions post vaccine: Difficulty breathing  Swelling of face and throat  A fast heartbeat  A bad rash all over body  Dizziness and weakness   Immunizations Administered     Name Date Dose VIS Date Route   PFIZER Comrnaty(Gray TOP) Covid-19 Vaccine 08/23/2020 12:44 PM 0.3 mL 02/05/2020 Intramuscular   Manufacturer: [employer], [employer]   Lot: [id number]   NDC: [phone]

## 2020-10-17 IMAGING — MR MR CERVICAL SPINE W/O CM
4 of 5 series · 28 of 48 positions shown · non-contrast
Comparison: CT cervical spine 11/07/2018. MRI cervical spine
03/08/2006

CLINICAL DATA: Cervicalgia

EXAM:
MRI CERVICAL SPINE WITHOUT CONTRAST
TECHNIQUE: Multiplanar, multisequence MR imaging of the cervical spine was
performed. No intravenous contrast was administered.

[Series 3: T2 · sagittal · 3.0mm · 0.72mm/px · 6 of 17 slices shown (1 of 2)]
[im 1/17]
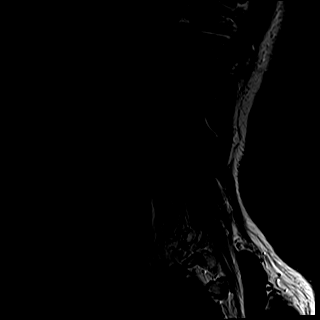
[im 4/17]
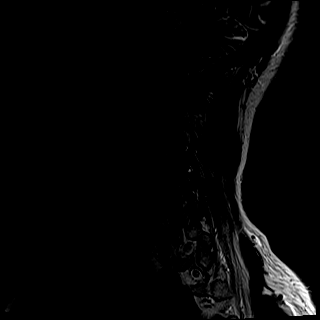
[im 7/17]
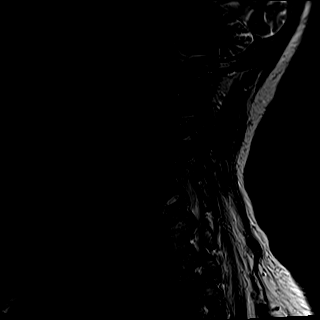
[im 10/17]
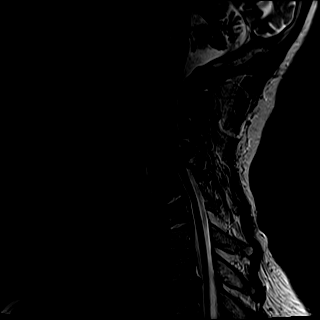
[im 13/17]
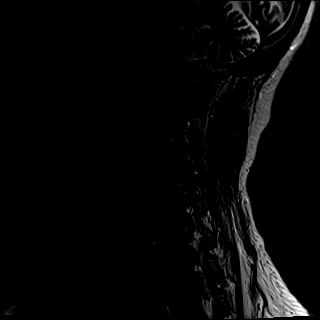
[im 17/17]
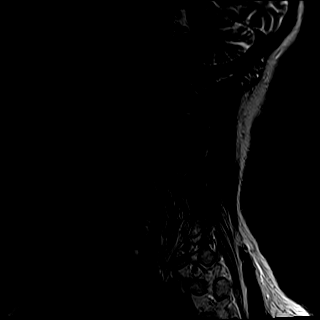

[Series 4: T1 · sagittal · 3.0mm · 0.41mm/px · 7 of 17 slices shown]
[im 1/17]
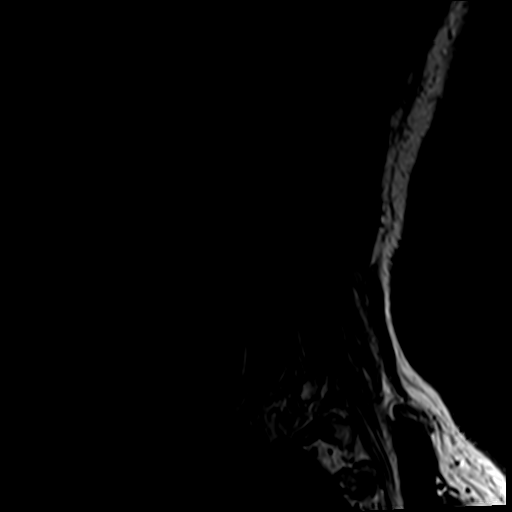
[im 3/17]
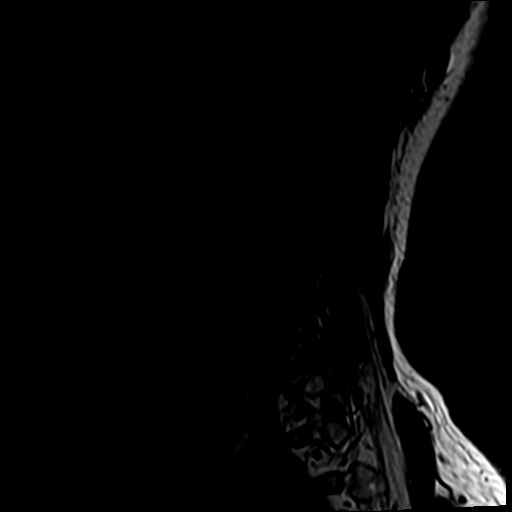
[im 6/17]
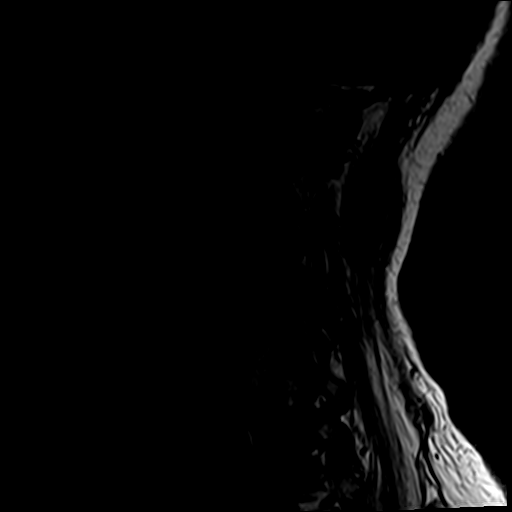
[im 9/17]
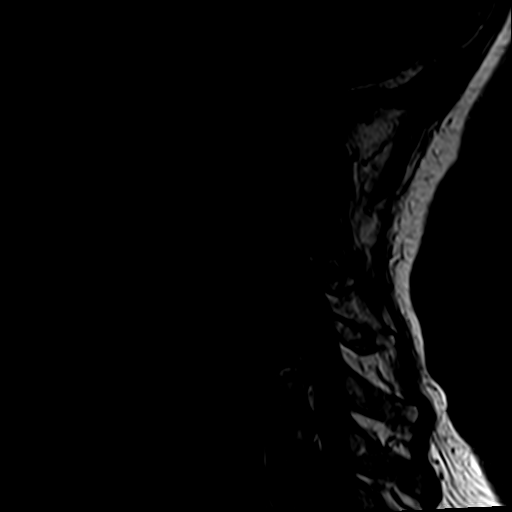
[im 11/17]
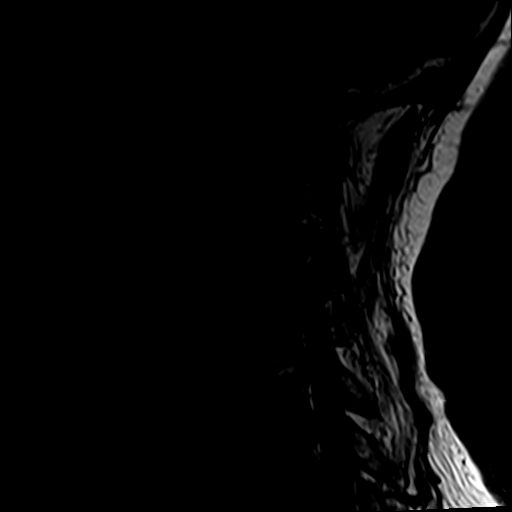
[im 14/17]
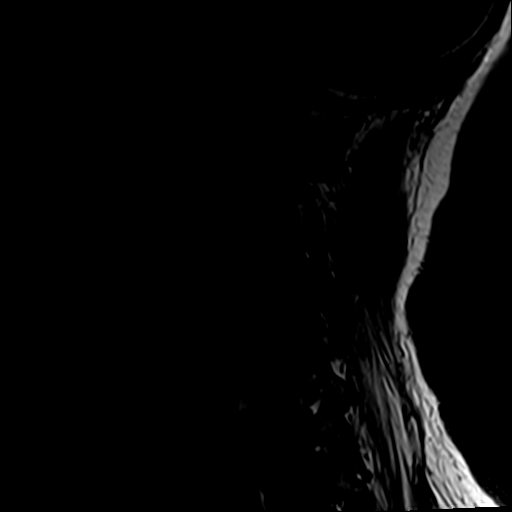
[im 17/17]
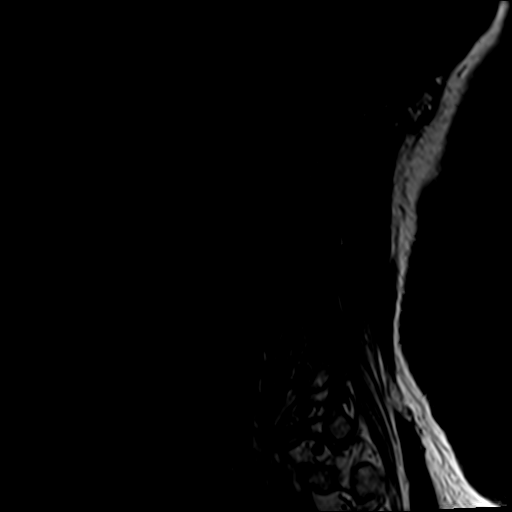

[Series 5: STIR · sagittal · 3.0mm · 0.41mm/px · 7 of 17 slices shown]
[im 1/17]
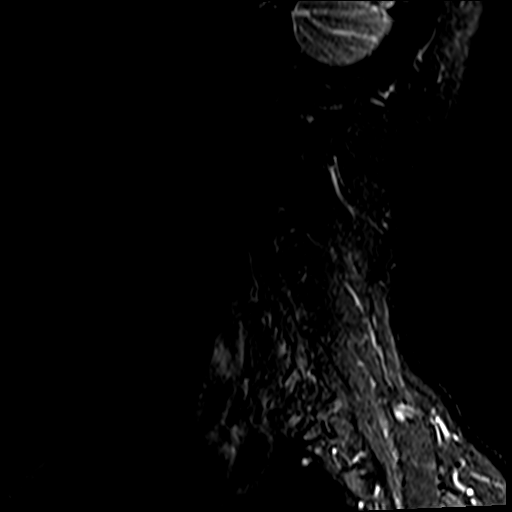
[im 3/17]
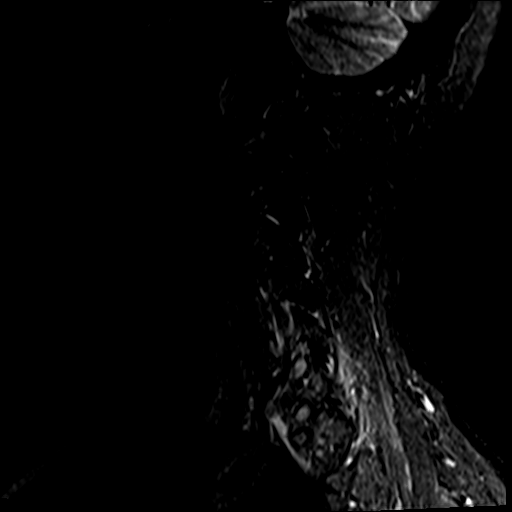
[im 6/17]
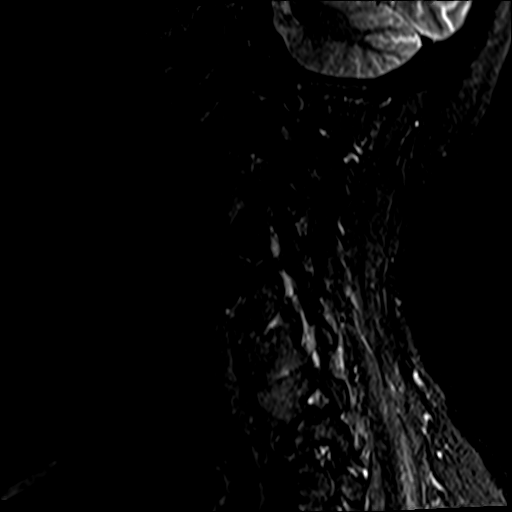
[im 9/17]
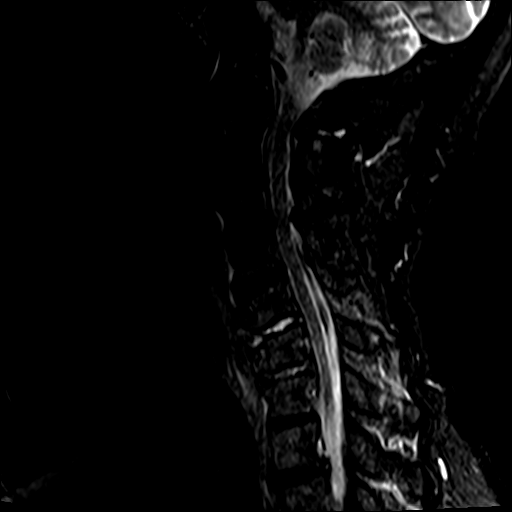
[im 11/17]
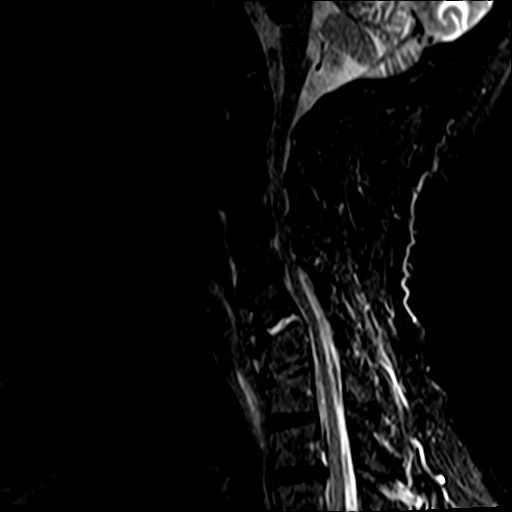
[im 14/17]
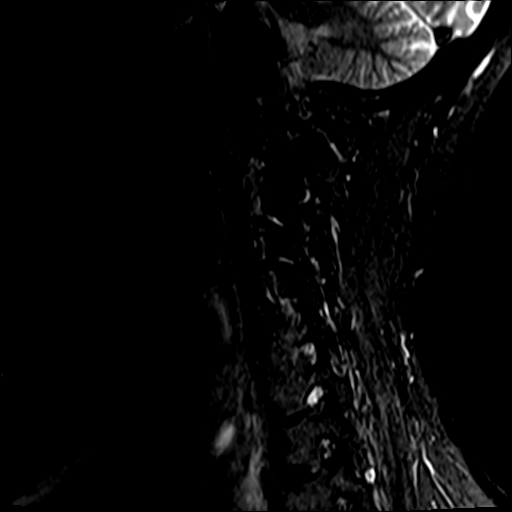
[im 17/17]
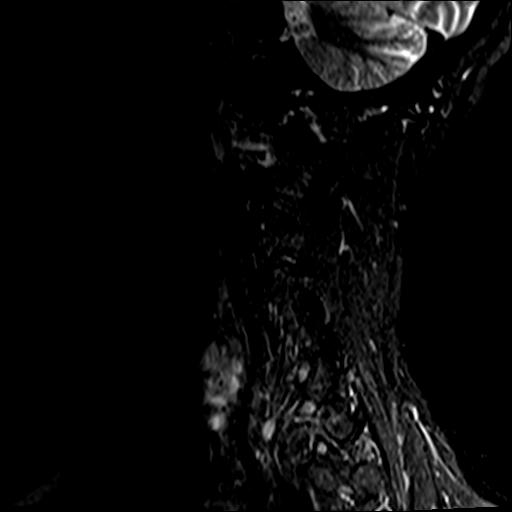

[Series 7: T2 · oblique · 3.0mm · 0.70mm/px · 8 of 34 slices shown (2 of 2)]
[im 1/34]
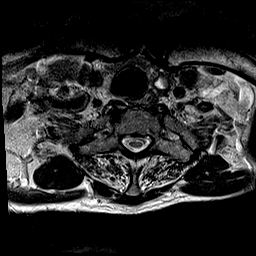
[im 6/34]
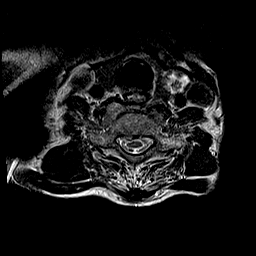
[im 11/34]
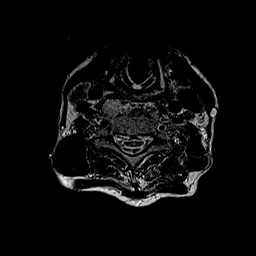
[im 16/34]
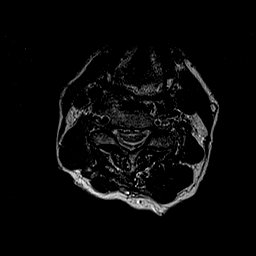
[im 18/34]
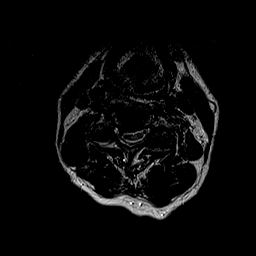
[im 23/34]
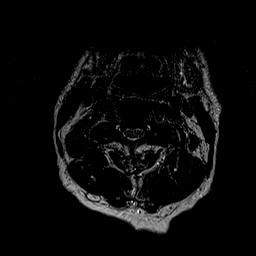
[im 28/34]
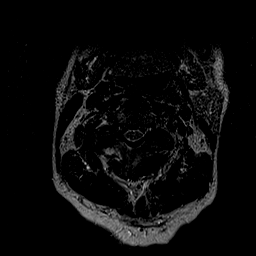
[im 34/34]
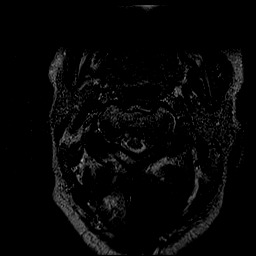

[28 of 48 positions shown; findings below may reference images not displayed]

FINDINGS: Alignment: Normal alignment.  Moderate kyphosis at C5-6

Vertebrae: Negative for fracture or mass

Cord: Patchy cord signal on inversion recovery felt to be artifact
not confirmed on other images. No cord lesion identified.

Posterior Fossa, vertebral arteries, paraspinal tissues: Negative

Disc levels:

C2-3: Central disc protrusion with mild spinal stenosis. Bilateral
facet degeneration

C3-4: Disc degeneration with central disc protrusion and diffuse
uncinate spurring. Moderate facet degeneration. Cord flattening with
moderate spinal stenosis and moderate foraminal stenosis
bilaterally. Spinal stenosis has progressed significantly since
1778.

C4-5: Disc degeneration with diffuse uncinate spurring right greater
than left. Cord flattening with mild to moderate spinal stenosis.
Moderate foraminal stenosis bilaterally. Bilateral facet
degeneration.

C5-6: Disc degeneration with uncinate spurring. Mild spinal stenosis
and mild foraminal stenosis bilaterally.

C6-7: Disc degeneration with diffuse uncinate spurring. Mild spinal
stenosis and mild foraminal stenosis bilaterally

C7-T1: Mild disc degeneration without stenosis

T1-2: Central disc protrusion with endplate spurring. Mild foraminal
stenosis bilaterally.
IMPRESSION: Multilevel degenerative change throughout the cervical spine.
Overall progression since 1778. Multilevel spinal and foraminal
stenosis as above.

## 2020-11-02 ENCOUNTER — Other Ambulatory Visit (HOSPITAL_BASED_OUTPATIENT_CLINIC_OR_DEPARTMENT_OTHER): Payer: Self-pay

## 2021-03-15 ENCOUNTER — Other Ambulatory Visit: Payer: Self-pay

## 2021-03-15 ENCOUNTER — Ambulatory Visit (HOSPITAL_COMMUNITY)
Admission: EM | Admit: 2021-03-15 | Discharge: 2021-03-15 | Disposition: A | Discharge status: Home / Self Care | Payer: Medicare Other | Attending: Emergency Medicine | Admitting: Emergency Medicine

## 2021-03-15 ENCOUNTER — Encounter (HOSPITAL_COMMUNITY): Payer: Self-pay | Admitting: *Deleted

## 2021-03-15 ENCOUNTER — Ambulatory Visit (INDEPENDENT_AMBULATORY_CARE_PROVIDER_SITE_OTHER): Payer: Medicare Other

## 2021-03-15 DIAGNOSIS — S29011A Strain of muscle and tendon of front wall of thorax, initial encounter: Secondary | ICD-10-CM | POA: Diagnosis not present

## 2021-03-15 DIAGNOSIS — W19XXXA Unspecified fall, initial encounter: Secondary | ICD-10-CM

## 2021-03-15 DIAGNOSIS — R1010 Upper abdominal pain, unspecified: Secondary | ICD-10-CM

## 2021-03-15 DIAGNOSIS — R0782 Intercostal pain: Secondary | ICD-10-CM

## 2021-03-15 MED ORDER — ACETAMINOPHEN 325 MG PO TABS: 650.0000 mg | ORAL_TABLET | Freq: Four times a day (QID) | ORAL | 0 refills | Status: AC | PRN

## 2021-03-15 MED ORDER — METHOCARBAMOL 500 MG PO TABS: 500.0000 mg | ORAL_TABLET | Freq: Two times a day (BID) | ORAL | 0 refills | Status: DC

## 2021-03-15 NOTE — ED Provider Notes (Signed)
[name]   MRN: [name] DOB: [name]  Subjective:   [name] is a 86 y.o. male presenting for suffering an accidental fall this morning.  Patient states that he tripped up on his feet and started to fall backwards, landed against a coffee table hurting his low back.  Shortly after his lower chest and upper abdomen started to hurt and is the primary source of his pain.  Hurts worse when he lays down.  When he is standing or walking he gets milder pain.  Has not taken anything for this.  Denies shortness of breath, hemoptysis, bruising, wounds, weakness, numbness or tingling, hematuria, confusion, head injury, loss of consciousness.  No current facility-administered medications for this encounter.  Current Outpatient Medications:    acetaminophen (TYLENOL) 500 MG tablet, Take 1,000 mg by mouth every 6 (six) hours as needed for mild pain or moderate pain. , Disp: , Rfl:    amLODipine (NORVASC) 10 MG tablet, Take 10 mg by mouth daily., Disp: , Rfl:    atenolol (TENORMIN) 50 MG tablet, Take 25 mg by mouth 2 (two) times daily. , Disp: , Rfl:    baclofen (LIORESAL) 10 MG tablet, Take 10 mg by mouth 3 (three) times daily as needed for muscle spasms., Disp: , Rfl:    brimonidine (ALPHAGAN) 0.2 % ophthalmic solution, Place 1 drop into both eyes 2 (two) times daily. , Disp: , Rfl:    COVID-19 mRNA Vac-TriS, Pfizer, (PFIZER-BIONT COVID-19 VAC-TRIS) SUSP injection, Inject into the muscle., Disp: 0.3 mL, Rfl: 0   diazepam (VALIUM) 5 MG tablet, Take 1 tablet (5 mg total) by mouth 2 (two) times daily., Disp: 10 tablet, Rfl: 0   dorzolamide-timolol (COSOPT) 22.3-6.8 MG/ML ophthalmic solution, Place 1 drop into both eyes 2 (two) times daily. , Disp: , Rfl:    doxazosin (CARDURA) 8 MG tablet, Take 8 mg by mouth at bedtime. , Disp: , Rfl:    finasteride (PROSCAR) 5 MG tablet, Take 5 mg by mouth at bedtime. , Disp: , Rfl:    losartan-hydrochlorothiazide (HYZAAR) 100-25 MG per  tablet, Take 1 tablet by mouth daily. If BP is below 139 will not take med. If BP is 140 - takes med, Disp: , Rfl:    prednisoLONE acetate (PRED FORTE) 1 % ophthalmic suspension, Place 1 drop into both eyes daily., Disp: , Rfl:    Allergies  Allergen Reactions   Celebrex [Celecoxib] Other (See Comments)    "ran my b/p up high, almost had a stroke blood pressure   Ivp Dye [Iodinated Contrast Media] Other (See Comments)    "started burning like fire" burning   Bextra [Valdecoxib] Other (See Comments)    Pt passed out Increase blood pressure   Chlorpromazine     Pt states he thinks he becomes dizzy at times, but hard to recall at this time   Ciprofloxacin     Unknown    Cyclopentolate Other (See Comments)    Pt is not to use eye drops due to adverse reactions   Flomax [Tamsulosin Hcl] Other (See Comments)    Blurred vision   Hyoscyamine Other (See Comments)    "I pass out"   Imuran [Azathioprine] Other (See Comments)    Patient unable to provide any additional info.  Not sure of this drug at all even after reviewing possible uses. Passed out   Methotrexate Other (See Comments)    Caused anemia   Methotrexate Derivatives     Killed red  blood cells, zombie like, killed immune system    Nasal Spray Other (See Comments)    "Blurred vision"   Penicillins Other (See Comments)    Problems with urination and GI problems    Past Medical History:  Diagnosis Date   Anemia    Arthritis    Complication of anesthesia    HARD TO WAKE UP   Constipation    DJD (degenerative joint disease)    Glaucoma    Headache    hx of   Heart murmur    History of kidney stones    HTN (hypertension)    Otitis media    with intermittent vertigo   Pneumonia    PONV (postoperative nausea and vomiting)    Weakness    of neck muscles     Past Surgical History:  Procedure Laterality Date   BACK SURGERY  2000   Carpal Tunnel Left    03/2014   CARPAL TUNNELL  1970'S   EYE SURGERY  06/10/2009 /  07/17/12   glaucoma    TONSILLECTOMY     TOTAL KNEE ARTHROPLASTY Left 08/05/2012   Procedure: LEFT TOTAL KNEE ARTHROPLASTY;  Surgeon: Gearlean Alf, MD;  Location: [facility];  Service: Orthopedics;  Laterality: Left;   TOTAL KNEE ARTHROPLASTY Right 09/25/2016   Procedure: RIGHT TOTAL KNEE ARTHROPLASTY;  Surgeon: Gaynelle Arabian, MD;  Location: [facility];  Service: Orthopedics;  Laterality: Right;    Family History  Problem Relation Age of Onset   Arthritis Mother    Glaucoma Mother    Hypertension Mother    GER disease Father    Hypertension Father    Dementia Sister    Cancer Sister        unknown   Hyperlipidemia Sister    Diabetes Brother    Hypertension Brother    ALS Brother    Cancer Brother        unknown   Dementia Brother    Heart disease Brother    Hypertension Brother    Diabetes Daughter    Diabetes Daughter    Diabetes Son     Social History   Tobacco Use   Smoking status: Never   Smokeless tobacco: Never  Vaping Use   Vaping Use: Never used  Substance Use Topics   Alcohol use: No    Alcohol/week: 0.0 standard drinks   Drug use: No    ROS   Objective:   Vitals: BP (!) 172/64    Pulse (!) 55    Temp 98.5 F (36.9 C)    Resp 18    SpO2 99%   Physical Exam Constitutional:      General: He is not in acute distress.    Appearance: Normal appearance. He is well-developed and normal weight. He is not ill-appearing, toxic-appearing or diaphoretic.  HENT:     Head: Normocephalic and atraumatic.     Right Ear: External ear normal.     Left Ear: External ear normal.     Nose: Nose normal.     Mouth/Throat:     Mouth: Mucous membranes are moist.     Pharynx: Oropharynx is clear.  Eyes:     General: No scleral icterus.       Right eye: No discharge.        Left eye: No discharge.     Extraocular Movements: Extraocular movements intact.     Pupils: Pupils are equal, round, and reactive to light.  Cardiovascular:     Rate  and Rhythm: Normal rate and  regular rhythm.     Heart sounds: Normal heart sounds. No murmur heard.   No friction rub. No gallop.  Pulmonary:     Effort: Pulmonary effort is normal. No respiratory distress.     Breath sounds: Normal breath sounds. No stridor. No wheezing, rhonchi or rales.  Chest:     Chest wall: Tenderness (Reproducible over area outlined extending into the upper abdomen) present.    Abdominal:     General: Bowel sounds are normal. There is no distension.     Palpations: Abdomen is soft. There is no mass.     Tenderness: There is abdominal tenderness in the epigastric area and periumbilical area. There is no right CVA tenderness, left CVA tenderness, guarding or rebound.  Musculoskeletal:     Cervical back: Normal range of motion.  Neurological:     Mental Status: He is alert and oriented to person, place, and time.  Psychiatric:        Mood and Affect: Mood normal.        Behavior: Behavior normal.        Thought Content: Thought content normal.        Judgment: Judgment normal.    DG Chest 2 View  Result Date: 03/15/2021 CLINICAL DATA:  fell backwards this morning at 0430. Pt landed with his lower back hitting the coffee table. Pt reports his ABD started to hurt as soon as he fell and bil rib pain.fall EXAM: CHEST - 2 VIEW COMPARISON:  None. FINDINGS: Normal mediastinum and cardiac silhouette. Normal pulmonary vasculature. No evidence of effusion, infiltrate, or pneumothorax. No acute bony abnormality. Degenerative osteophytosis of the spine. IMPRESSION: No acute cardiopulmonary process. Electronically Signed   By: Suzy Bouchard M.D.   On: 03/15/2021 16:22    ED ECG REPORT   Date: 03/15/2021  Rate: 44bpm  Rhythm: sinus bradycardia  QRS Axis: left  Intervals: normal  ST/T Wave abnormalities: nonspecific ST/T changes  Conduction Disutrbances:first-degree A-V block   Narrative Interpretation: Sinus bradycardia at 44 bpm with known first-degree AV block, signs of LVH, nonspecific ST and  T changes in the lateral leads.  EKG is very comparable to the previous ones.  No acute findings.  Old EKG Reviewed: unchanged  I have personally reviewed the EKG tracing and agree with the computerized printout as noted.   Assessment and Plan :   PDMP not reviewed this encounter.  1. Chest wall muscle strain, initial encounter   2. Upper abdominal pain   3. Accidental fall, initial encounter    Recommended conservative management for what I suspect is chest wall pain, musculoskeletal pain.  Recommended Tylenol, Robaxin.  Chest x-ray and EKG are reassuring.  Recommended follow-up with his cardiologist.  No new findings on the EKG. Counseled patient on potential for adverse effects with medications prescribed/recommended today, ER and return-to-clinic precautions discussed, patient verbalized understanding.    Jaynee Eagles, PA-C 03/15/21 1705

## 2021-03-15 NOTE — ED Triage Notes (Signed)
Pt reports he fell backwards this morning at 0430. Pt landed with his lower back hitting the coffee table. Pt reports his ABD started to hurt as soon as he fell and bil rib pain.

## 2021-04-13 ENCOUNTER — Ambulatory Visit
Admission: RE | Admit: 2021-04-13 | Discharge: 2021-04-13 | Disposition: A | Discharge status: Home / Self Care | Payer: Medicare Other | Source: Ambulatory Visit | Attending: Internal Medicine | Admitting: Internal Medicine

## 2021-04-13 ENCOUNTER — Other Ambulatory Visit: Payer: Self-pay | Admitting: Internal Medicine

## 2021-04-13 DIAGNOSIS — R0989 Other specified symptoms and signs involving the circulatory and respiratory systems: Secondary | ICD-10-CM

## 2021-04-28 ENCOUNTER — Telehealth (HOSPITAL_COMMUNITY): Payer: Self-pay

## 2021-04-28 NOTE — Telephone Encounter (Signed)
I called patient back. Pt called about questions of medications. Pt did not answer. I left a message.  ?

## 2023-08-03 DIAGNOSIS — Z947 Corneal transplant status: Secondary | ICD-10-CM | POA: Diagnosis not present

## 2023-08-03 DIAGNOSIS — H1811 Bullous keratopathy, right eye: Secondary | ICD-10-CM | POA: Diagnosis not present

## 2023-08-07 ENCOUNTER — Ambulatory Visit: Payer: Self-pay | Admitting: Nurse Practitioner

## 2023-08-19 DIAGNOSIS — H2702 Aphakia, left eye: Secondary | ICD-10-CM | POA: Diagnosis not present

## 2023-08-19 DIAGNOSIS — H4042X2 Glaucoma secondary to eye inflammation, left eye, moderate stage: Secondary | ICD-10-CM | POA: Diagnosis not present

## 2023-09-24 DIAGNOSIS — H209 Unspecified iridocyclitis: Secondary | ICD-10-CM | POA: Diagnosis not present

## 2023-09-24 DIAGNOSIS — Z947 Corneal transplant status: Secondary | ICD-10-CM | POA: Diagnosis not present

## 2023-09-24 DIAGNOSIS — H4041X3 Glaucoma secondary to eye inflammation, right eye, severe stage: Secondary | ICD-10-CM | POA: Diagnosis not present

## 2023-09-24 DIAGNOSIS — H4042X2 Glaucoma secondary to eye inflammation, left eye, moderate stage: Secondary | ICD-10-CM | POA: Diagnosis not present

## 2023-10-12 ENCOUNTER — Ambulatory Visit: Payer: Self-pay | Admitting: Nurse Practitioner

## 2023-10-31 ENCOUNTER — Encounter: Payer: Self-pay | Admitting: Family Medicine

## 2023-10-31 ENCOUNTER — Ambulatory Visit: Payer: Medicare (Managed Care) | Admitting: Family Medicine

## 2023-10-31 VITALS — BP 136/61 | HR 74 | Temp 97.0°F | Resp 18 | Ht 74.0 in | Wt 195.2 lb

## 2023-10-31 DIAGNOSIS — N401 Enlarged prostate with lower urinary tract symptoms: Secondary | ICD-10-CM

## 2023-10-31 DIAGNOSIS — I10 Essential (primary) hypertension: Secondary | ICD-10-CM

## 2023-10-31 DIAGNOSIS — R351 Nocturia: Secondary | ICD-10-CM | POA: Diagnosis not present

## 2023-10-31 DIAGNOSIS — H60543 Acute eczematoid otitis externa, bilateral: Secondary | ICD-10-CM | POA: Diagnosis not present

## 2023-10-31 DIAGNOSIS — H401113 Primary open-angle glaucoma, right eye, severe stage: Secondary | ICD-10-CM

## 2023-10-31 DIAGNOSIS — H02401 Unspecified ptosis of right eyelid: Secondary | ICD-10-CM

## 2023-10-31 DIAGNOSIS — E782 Mixed hyperlipidemia: Secondary | ICD-10-CM

## 2023-10-31 DIAGNOSIS — I1A0 Resistant hypertension: Secondary | ICD-10-CM

## 2023-10-31 MED ORDER — HYDROCORTISONE 2.5 % EX OINT: TOPICAL_OINTMENT | CUTANEOUS | 3 refills | Status: DC

## 2023-10-31 MED ORDER — LOSARTAN POTASSIUM-HCTZ 100-25 MG PO TABS: 1.0000 | ORAL_TABLET | Freq: Every day | ORAL | 3 refills | Status: DC

## 2023-10-31 NOTE — Patient Instructions (Signed)
  VISIT SUMMARY: Today, you came in to establish care and discuss several ongoing health issues, including vision problems, hypertension, benign prostatic hyperplasia (BPH), iron deficiency anemia, hyperlipidemia, spinal stenosis, and ear itching. We reviewed your current medications and overall health status.  YOUR PLAN: -ECZEMA OF EXTERNAL EAR CANAL: Eczema is a condition that causes the skin to become itchy, red, and inflamed. You have chronic itching in both ears, likely due to eczema, with no signs of infection or drainage. We prescribed hydrocortisone  cream to help with the itching. Apply the cream and let it set for 30 minutes before using sweet oil. Avoid scratching your ears to prevent further irritation. We will reassess your ear condition in one month.  -HYPERTENSION: Hypertension, or high blood pressure, is a condition where the force of the blood against your artery walls is too high. Your blood pressure is well-controlled with your current medications. We refilled your losartan -hydrochlorothiazide  prescription and advised you to continue your current medications and maintain adequate hydration to prevent your blood pressure from dropping.  -BENIGN PROSTATIC HYPERPLASIA (BPH) WITH LOWER URINARY TRACT SYMPTOMS: BPH is an enlarged prostate gland that can cause urinary symptoms. You have been managing your symptoms effectively with saw palmetto and reported no current urinary issues. Continue your current management with saw palmetto.  INSTRUCTIONS: Please schedule a follow-up appointment in one month for fasting blood work and to reassess your ear condition.

## 2023-10-31 NOTE — Progress Notes (Signed)
 Assessment  Assessment/Plan:  Assessment and Plan Assessment & Plan Eczema of external ear canal Chronic itching in both ears, likely due to eczema. No signs of infection or drainage. Possible irritation from hearing aid use. - Prescribe hydrocortisone  2.5 % ointment BID for ear itching. Apply cream and let it set for 30 minutes before applying sweet oil. - Advise against scratching the ears to prevent further irritation. - Schedule follow-up in one month to reassess ear condition.  Hypertension Hypertension is well-controlled with current medication regimen. Blood pressure reading at 136/61 mmHg. Regular monitoring at home. - Refill losartan -hydrochlorothiazide  prescription through [facility]. - Continue current antihypertensive medications. - Advise to maintain adequate hydration to prevent blood pressure from dropping.  Benign prostatic hyperplasia with lower urinary tract symptoms BPH with previous urinary symptoms. Currently taking saw palmetto doxasosin and finasteriad. No current urinary issues reported. - Continue current management  Follow-Up Follow-up appointment needed to reassess ear condition and perform fasting blood work. - Schedule follow-up appointment in one month for fasting blood work and to reassess ear condition.     Medications Discontinued During This Encounter  Medication Reason   hydrocortisone  cream 0.5 %    losartan -hydrochlorothiazide  (HYZAAR) 100-25 MG per tablet Reorder   Return in about 1 month (around 11/30/2023) for fasting labs, BP, HLD, ear eczema and itching.        Subjective:   Encounter date: 10/31/2023  Chief Complaint  Patient presents with   Establish Care    Pt is not fasting today  HM due - vaccinations (patient declined)     Discussed the use of AI scribe software for clinical note transcription with the patient, who gave verbal consent to proceed.  History of Present Illness [name] is an 88 year old  male who presents to establish care.  He has ongoing vision issues, including bilateral ptosis and glaucoma, and follows with ophthalmology. His current medications include brimonidine , prednate, dorsolide, timolol , ketorolac  0.4%, latanoprost, neomycin-polymyxin B-dexamethasone , prednisolone  acetate, sodium chloride  5% (Muro 128), trimethoprim-polymyxin B, and tropicamide ophthalmic drops. He occasionally uses neomycin-polymyxin B-dexamethasone  drops and sodium chloride  5% drops.  He has a history of hypertension. His current medications for hypertension include amlodipine  10 mg, doxazosin  8 mg, and losartan -hydrochlorothiazide  100-25 mg daily. He also takes finasteride  5 mg for benign prostatic hyperplasia (BPH).  He has a history of iron deficiency anemia for which he takes sulfate supplements. Additionally, he is on rosuvastatin 5 mg for hyperlipidemia.  He experiences spinal stenosis in the cervical region and uses methocarbamol  as needed for symptom relief.  He has bilateral ear itching, which he manages with sweet oil. He uses a hearing aid in his right ear but forgot to wear it today. No pain or drainage from the ears.  He has a history of bilateral knee replacements and reports no current pain or issues with his knees.  He monitors his blood pressure regularly, checking it three times a day, and adjusts his water  intake to prevent hypotension. He reports nocturia, which varies with his water  intake, and has a history of urinary issues that have been managed with saw palmetto supplements for the past 30 years.       10/31/2023   11:01 AM 08/22/2017    1:35 PM  Depression screen PHQ 2/9  Decreased Interest 1 0  Down, Depressed, Hopeless 1 0  PHQ - 2 Score 2 0  Altered sleeping 0   Tired, decreased energy 0   Change in appetite 0  Feeling bad or failure about yourself  0   Trouble concentrating 0   Moving slowly or fidgety/restless 0   Suicidal thoughts 0   PHQ-9 Score 2    Difficult doing work/chores Somewhat difficult        10/31/2023   11:01 AM  GAD 7 : Generalized Anxiety Score  Nervous, Anxious, on Edge 0  Control/stop worrying 0  Worry too much - different things 0  Trouble relaxing 0  Restless 0  Easily annoyed or irritable 0  Afraid - awful might happen 0  Total GAD 7 Score 0  Anxiety Difficulty Not difficult at all    Health Maintenance Due  Topic Date Due   Medicare Annual Wellness (AWV)  Never done     PMH:  The following were reviewed and entered/updated in epic: Past Medical History:  Diagnosis Date   Anemia    Arthritis    Complication of anesthesia    HARD TO WAKE UP   Constipation    DJD (degenerative joint disease)    Glaucoma    Headache    hx of   Heart murmur    History of kidney stones    HTN (hypertension)    Otitis media    with intermittent vertigo   Pneumonia    PONV (postoperative nausea and vomiting)    Weakness    of neck muscles    Patient Active Problem List   Diagnosis Date Noted   Eczema of both external ears 10/31/2023   Mixed hyperlipidemia 10/31/2023   Spinal stenosis of cervical region 06/12/2019   BPH (benign prostatic hyperplasia) 08/22/2017   Arthralgia, neck 05/20/2014   Arthritis    OA (osteoarthritis) of knee 08/05/2012   Allergic rhinitis 03/29/2012   Glaucoma 03/29/2012   HTN (hypertension)    DJD (degenerative joint disease)    Ptosis of eyelid 04/18/2011    Past Surgical History:  Procedure Laterality Date   BACK SURGERY  2000   Carpal Tunnel Left    03/2014   CARPAL TUNNELL  1970'S   EYE SURGERY  06/10/2009 / 07/17/12   glaucoma    TONSILLECTOMY     TOTAL KNEE ARTHROPLASTY Left 08/05/2012   Procedure: LEFT TOTAL KNEE ARTHROPLASTY;  Surgeon: Dempsey LULLA Moan, MD;  Location: [facility];  Service: Orthopedics;  Laterality: Left;   TOTAL KNEE ARTHROPLASTY Right 09/25/2016   Procedure: RIGHT TOTAL KNEE ARTHROPLASTY;  Surgeon: Moan Dempsey, MD;  Location: [facility];  Service:  Orthopedics;  Laterality: Right;    Family History  Problem Relation Age of Onset   Arthritis Mother    Glaucoma Mother    Hypertension Mother    GER disease Father    Hypertension Father    Dementia Sister    Cancer Sister        unknown   Hyperlipidemia Sister    Diabetes Brother    Hypertension Brother    ALS Brother    Cancer Brother        unknown   Dementia Brother    Heart disease Brother    Hypertension Brother    Diabetes Daughter    Diabetes Daughter    Diabetes Son     Medications- reviewed and updated Outpatient Medications Prior to Visit  Medication Sig Dispense Refill   acetaminophen  (TYLENOL ) 325 MG tablet Take 2 tablets (650 mg total) by mouth every 6 (six) hours as needed for moderate pain. 30 tablet 0   amLODipine  (NORVASC ) 10 MG tablet Take 10 mg  by mouth daily.     aspirin EC 81 MG tablet Take by mouth.     atenolol  (TENORMIN ) 50 MG tablet Take 25 mg by mouth 2 (two) times daily.      baclofen (LIORESAL) 10 MG tablet Take 10 mg by mouth 3 (three) times daily as needed for muscle spasms.     brimonidine  (ALPHAGAN ) 0.2 % ophthalmic solution Place 1 drop into both eyes 2 (two) times daily.      Cetirizine HCl (ZYRTEC ALLERGY) 10 MG CAPS Take 10 mg by mouth.     Cholecalciferol 50 MCG (2000 UT) CAPS Take 2,000 Units by mouth.     COVID-19 mRNA Vac-TriS, Pfizer, (PFIZER-BIONT COVID-19 VAC-TRIS) SUSP injection Inject into the muscle. 0.3 mL 0   diazepam  (VALIUM ) 5 MG tablet Take 1 tablet (5 mg total) by mouth 2 (two) times daily. 10 tablet 0   Difluprednate  0.05 % EMUL Apply 1 drop to eye.     dorzolamide -timolol  (COSOPT ) 22.3-6.8 MG/ML ophthalmic solution Place 1 drop into both eyes 2 (two) times daily.      doxazosin  (CARDURA ) 8 MG tablet Take 8 mg by mouth at bedtime.      ferrous sulfate 325 (65 FE) MG tablet Take 325 mg by mouth.     finasteride  (PROSCAR ) 5 MG tablet Take 5 mg by mouth at bedtime.      ketorolac  (ACULAR  LS) 0.4 % SOLN Apply 1 drop to  eye.     latanoprost (XALATAN) 0.005 % ophthalmic solution SMARTSIG:In Eye(s)     methocarbamol  (ROBAXIN ) 500 MG tablet Take 1 tablet (500 mg total) by mouth 2 (two) times daily. 20 tablet 0   Multiple Vitamin (MULTI-VITAMIN) tablet Take by mouth.     neomycin-polymyxin b-dexamethasone  (MAXITROL) 3.5-10000-0.1 OINT SMARTSIG:sparingly Left Eye Every Night     prednisoLONE  acetate (PRED FORTE ) 1 % ophthalmic suspension Place 1 drop into both eyes daily.     rosuvastatin (CRESTOR) 5 MG tablet Take 5 mg by mouth.     sodium chloride  (MURO 128) 5 % ophthalmic ointment Apply 1 Application to eye.     sodium chloride  (MURO 128) 5 % ophthalmic solution Apply 1 drop to eye.     trimethoprim-polymyxin b (POLYTRIM) ophthalmic solution 1 drop 4 times a day for 3 days LEFT eye     tropicamide (MYDRIACYL) 1 % ophthalmic solution Apply 1 drop to eye.     hydrocortisone  cream 0.5 % APPLY TO AFFECTED AREA(S) TWICE DAILY     losartan -hydrochlorothiazide  (HYZAAR) 100-25 MG per tablet Take 1 tablet by mouth daily. If BP is below 139 will not take med. If BP is 140 - takes med     No facility-administered medications prior to visit.    Allergies  Allergen Reactions   Celebrex [Celecoxib] Other (See Comments)    ran my b/p up high, almost had a stroke blood pressure   Doxycycline Anaphylaxis and Itching   Ivp Dye [Iodinated Contrast Media] Other (See Comments)    started burning like fire burning   Bextra [Valdecoxib] Other (See Comments)    Pt passed out Increase blood pressure   Chlorpromazine     Pt states he thinks he becomes dizzy at times, but hard to recall at this time   Ciprofloxacin     Unknown    Cyclopentolate Other (See Comments)    Pt is not to use eye drops due to adverse reactions   Flomax [Tamsulosin Hcl] Other (See Comments)    Blurred vision  Hyoscyamine Other (See Comments)    I pass out   Imuran [Azathioprine] Other (See Comments)    Patient unable to provide any  additional info.  Not sure of this drug at all even after reviewing possible uses. Passed out   Methotrexate Other (See Comments)    Caused anemia   Methotrexate Derivatives     Killed red blood cells, zombie like, killed immune system    Nasal Spray Other (See Comments)    Blurred vision   Nasal Spray Other (See Comments)   Penicillins Other (See Comments)    Problems with urination and GI problems   Timolol  Other (See Comments)    Bradycardia, but patient on oral betablocker as well.    Social History   Socioeconomic History   Marital status: Widowed    Spouse name: Not on file   Number of children: 3   Years of education: 58 th   Highest education level: Not on file  Occupational History    Comment: rettired.  Tobacco Use   Smoking status: Never   Smokeless tobacco: Never  Vaping Use   Vaping status: Never Used  Substance and Sexual Activity   Alcohol use: No    Alcohol/week: 0.0 standard drinks of alcohol   Drug use: No   Sexual activity: Never  Other Topics Concern   Not on file  Social History Narrative   Patient lives at home alone and he is widowed. Had 13 brothers and sisters. Has 3 children.   Retired.   Education high school.   Right handed.   Caffeine Very rare soda and one coffee.   Enjoys bowling, not able to play after knee replacements   Social Drivers of Corporate investment banker Strain: Not on file  Food Insecurity: Not on file  Transportation Needs: Not on file  Physical Activity: Not on file  Stress: Not on file  Social Connections: Not on file           Objective:  Physical Exam: BP 136/61 (BP Location: Left Arm, Patient Position: Sitting, Cuff Size: Large)   Pulse 74   Temp (!) 97 F (36.1 C) (Temporal)   Resp 18   Ht 6' 2 (1.88 m)   Wt 195 lb 3.2 oz (88.5 kg)   SpO2 100%   BMI 25.06 kg/m   Body mass index is 25.06 kg/m. Wt Readings from Last 3 Encounters:  10/31/23 195 lb 3.2 oz (88.5 kg)  07/11/19 204 lb (92.5 kg)   06/11/19 204 lb (92.5 kg)    Physical Exam VITALS: BP- 136/61 GENERAL: Alert, cooperative, well developed, no acute distress. HEENT: Normocephalic, normal oropharynx, moist mucous membranes, no infection in ears. CHEST: Clear to auscultation bilaterally, no wheezes, rhonchi, or crackles. CARDIOVASCULAR: Normal heart rate and rhythm, S1 and S2 normal without murmurs. ABDOMEN: Soft, non-tender, non-distended, without organomegaly, normal bowel sounds. EXTREMITIES: No cyanosis or edema. NEUROLOGICAL: Cranial nerves grossly intact, moves all extremities without gross motor or sensory deficit.  Physical Exam      Prior labs:   No results found for this or any previous visit (from the past 2160 hours).  No results found for: CHOL No results found for: HDL No results found for: LDLCALC No results found for: TRIG No results found for: CHOLHDL No results found for: LDLDIRECT  Last metabolic panel Lab Results  Component Value Date   GLUCOSE 98 06/03/2019   NA 142 06/03/2019   K 3.7 06/03/2019   CL 107 06/03/2019  CO2 25 06/03/2019   BUN 10 06/03/2019   CREATININE 0.92 06/03/2019   GFRNONAA >60 06/03/2019   CALCIUM 9.4 06/03/2019   PROT 7.3 09/18/2016   ALBUMIN 3.8 09/18/2016   BILITOT 0.7 09/18/2016   ALKPHOS 67 09/18/2016   AST 29 09/18/2016   ALT 19 09/18/2016   ANIONGAP 10 06/03/2019    No results found for: HGBA1C  Last CBC Lab Results  Component Value Date   WBC 5.8 06/03/2019   HGB 13.2 06/03/2019   HCT 38.0 (L) 06/03/2019   MCV 90.7 06/03/2019   MCH 31.5 06/03/2019   RDW 14.0 06/03/2019   PLT 172 06/03/2019    No results found for: TSH  No results found for: PSA1, PSA  Last vitamin D No results found for: MARIEN BOLLS, VD25OH  Lab Results  Component Value Date   BILIRUBINUR NEGATIVE 09/27/2016   PROTEINUR NEGATIVE 09/27/2016   UROBILINOGEN 0.2 07/29/2012   LEUKOCYTESUR NEGATIVE 09/27/2016    No results  found for: LABMICR, MICROALBUR   At today's visit, we discussed treatment options, associated risk and benefits, and engage in counseling as needed.  Additionally the following were reviewed: Past medical records, past medical and surgical history, family and social background, as well as relevant laboratory results, imaging findings, and specialty notes, where applicable.  This message was generated using dictation software, and as a result, it may contain unintentional typos or errors.  Nevertheless, extensive effort was made to accurately convey at the pertinent aspects of the patient visit.    There may have been are other unrelated non-urgent complaints, but due to the busy schedule and the amount of time already spent with him, time does not permit to address these issues at today's visit. Another appointment may have or has been requested to review these additional issues.     Arvella Hummer, MD, MS

## 2023-11-30 ENCOUNTER — Ambulatory Visit: Payer: Medicare (Managed Care) | Admitting: Family Medicine

## 2023-12-18 ENCOUNTER — Encounter: Payer: Self-pay | Admitting: Family Medicine

## 2023-12-18 ENCOUNTER — Ambulatory Visit: Payer: Medicare (Managed Care) | Admitting: Family Medicine

## 2023-12-18 ENCOUNTER — Telehealth: Payer: Self-pay

## 2023-12-18 VITALS — BP 137/72 | HR 70 | Temp 97.1°F | Resp 18 | Wt 201.0 lb

## 2023-12-18 DIAGNOSIS — M4802 Spinal stenosis, cervical region: Secondary | ICD-10-CM | POA: Diagnosis not present

## 2023-12-18 DIAGNOSIS — E782 Mixed hyperlipidemia: Secondary | ICD-10-CM

## 2023-12-18 DIAGNOSIS — I1A0 Resistant hypertension: Secondary | ICD-10-CM

## 2023-12-18 DIAGNOSIS — N400 Enlarged prostate without lower urinary tract symptoms: Secondary | ICD-10-CM | POA: Diagnosis not present

## 2023-12-18 DIAGNOSIS — H401113 Primary open-angle glaucoma, right eye, severe stage: Secondary | ICD-10-CM

## 2023-12-18 DIAGNOSIS — J301 Allergic rhinitis due to pollen: Secondary | ICD-10-CM

## 2023-12-18 DIAGNOSIS — E559 Vitamin D deficiency, unspecified: Secondary | ICD-10-CM

## 2023-12-18 DIAGNOSIS — G8929 Other chronic pain: Secondary | ICD-10-CM

## 2023-12-18 DIAGNOSIS — M25511 Pain in right shoulder: Secondary | ICD-10-CM | POA: Diagnosis not present

## 2023-12-18 DIAGNOSIS — H60543 Acute eczematoid otitis externa, bilateral: Secondary | ICD-10-CM

## 2023-12-18 DIAGNOSIS — M4712 Other spondylosis with myelopathy, cervical region: Secondary | ICD-10-CM | POA: Diagnosis not present

## 2023-12-18 DIAGNOSIS — D509 Iron deficiency anemia, unspecified: Secondary | ICD-10-CM

## 2023-12-18 DIAGNOSIS — K5904 Chronic idiopathic constipation: Secondary | ICD-10-CM | POA: Diagnosis not present

## 2023-12-18 LAB — CBC WITH DIFFERENTIAL/PLATELET
Basophils Absolute: 0 K/uL (ref 0.0–0.1)
Basophils Relative: 0.9 % (ref 0.0–3.0)
Eosinophils Absolute: 0.4 K/uL (ref 0.0–0.7)
Eosinophils Relative: 8.2 % — ABNORMAL HIGH (ref 0.0–5.0)
HCT: 36.6 % — ABNORMAL LOW (ref 39.0–52.0)
Hemoglobin: 12.7 g/dL — ABNORMAL LOW (ref 13.0–17.0)
Lymphocytes Relative: 24.8 % (ref 12.0–46.0)
Lymphs Abs: 1.1 K/uL (ref 0.7–4.0)
MCHC: 34.7 g/dL (ref 30.0–36.0)
MCV: 93.5 fl (ref 78.0–100.0)
Monocytes Absolute: 0.5 K/uL (ref 0.1–1.0)
Monocytes Relative: 11.9 % (ref 3.0–12.0)
Neutro Abs: 2.3 K/uL (ref 1.4–7.7)
Neutrophils Relative %: 54.2 % (ref 43.0–77.0)
Platelets: 177 K/uL (ref 150.0–400.0)
RBC: 3.92 Mil/uL — ABNORMAL LOW (ref 4.22–5.81)
RDW: 14.2 % (ref 11.5–15.5)
WBC: 4.3 K/uL (ref 4.0–10.5)

## 2023-12-18 LAB — COMPREHENSIVE METABOLIC PANEL WITH GFR
ALT: 13 U/L (ref 0–53)
AST: 20 U/L (ref 0–37)
Albumin: 4.3 g/dL (ref 3.5–5.2)
Alkaline Phosphatase: 77 U/L (ref 39–117)
BUN: 19 mg/dL (ref 6–23)
CO2: 28 meq/L (ref 19–32)
Calcium: 9.1 mg/dL (ref 8.4–10.5)
Chloride: 104 meq/L (ref 96–112)
Creatinine, Ser: 1.19 mg/dL (ref 0.40–1.50)
GFR: 55.34 mL/min — ABNORMAL LOW (ref >60.00)
Glucose, Bld: 80 mg/dL (ref 70–99)
Potassium: 3.6 meq/L (ref 3.5–5.1)
Sodium: 139 meq/L (ref 135–145)
Total Bilirubin: 0.5 mg/dL (ref 0.2–1.2)
Total Protein: 7 g/dL (ref 6.0–8.3)

## 2023-12-18 LAB — LIPID PANEL
Cholesterol: 185 mg/dL (ref 0–200)
HDL: 59.9 mg/dL (ref >39.00)
LDL Cholesterol: 78 mg/dL (ref 0–99)
NonHDL: 125.18
Total CHOL/HDL Ratio: 3
Triglycerides: 234 mg/dL — ABNORMAL HIGH (ref 0.0–149.0)
VLDL: 46.8 mg/dL — ABNORMAL HIGH (ref 0.0–40.0)

## 2023-12-18 LAB — B12 AND FOLATE PANEL
Folate: 16.7 ng/mL (ref >5.9)
Vitamin B-12: 337 pg/mL (ref 211–911)

## 2023-12-18 LAB — MICROALBUMIN / CREATININE URINE RATIO
Creatinine,U: 239.1 mg/dL
Microalb Creat Ratio: 8.4 mg/g (ref 0.0–30.0)
Microalb, Ur: 2 mg/dL — ABNORMAL HIGH (ref 0.0–1.9)

## 2023-12-18 LAB — TSH: TSH: 3.01 u[IU]/mL (ref 0.35–5.50)

## 2023-12-18 LAB — HEMOGLOBIN A1C: Hgb A1c MFr Bld: 5.7 % (ref 4.6–6.5)

## 2023-12-18 LAB — PSA: PSA: 0.33 ng/mL (ref 0.10–4.00)

## 2023-12-18 MED ORDER — DOXAZOSIN MESYLATE 8 MG PO TABS: 8.0000 mg | ORAL_TABLET | Freq: Every day | ORAL | 3 refills | Status: AC

## 2023-12-18 MED ORDER — MOMETASONE FUROATE 0.1 % EX CREA
TOPICAL_CREAM | CUTANEOUS | 1 refills | Status: AC
Start: 2023-12-18 — End: ?

## 2023-12-18 MED ORDER — ZYRTEC ALLERGY 10 MG PO CAPS: 10.0000 mg | ORAL_CAPSULE | Freq: Every evening | ORAL | 3 refills | Status: AC | PRN

## 2023-12-18 MED ORDER — LINACLOTIDE 72 MCG PO CAPS
72.0000 ug | ORAL_CAPSULE | Freq: Every day | ORAL | 0 refills | Status: DC
Start: 2023-12-18 — End: 2024-01-18

## 2023-12-18 MED ORDER — ATENOLOL 25 MG PO TABS
25.0000 mg | ORAL_TABLET | Freq: Two times a day (BID) | ORAL | 3 refills | Status: AC
Start: 2023-12-18 — End: 2024-12-12

## 2023-12-18 MED ORDER — FERROUS SULFATE 325 (65 FE) MG PO TABS: 325.0000 mg | ORAL_TABLET | Freq: Every day | ORAL | 3 refills | Status: AC

## 2023-12-18 MED ORDER — LOSARTAN POTASSIUM-HCTZ 100-25 MG PO TABS
1.0000 | ORAL_TABLET | Freq: Every day | ORAL | 3 refills | Status: DC
Start: 2023-12-18 — End: 2023-12-26

## 2023-12-18 MED ORDER — ROSUVASTATIN CALCIUM 5 MG PO TABS: 5.0000 mg | ORAL_TABLET | Freq: Every day | ORAL | 3 refills | Status: AC

## 2023-12-18 MED ORDER — FINASTERIDE 5 MG PO TABS: 5.0000 mg | ORAL_TABLET | Freq: Every day | ORAL | 3 refills | Status: AC

## 2023-12-18 MED ORDER — ASPIRIN 81 MG PO TBEC
81.0000 mg | DELAYED_RELEASE_TABLET | Freq: Every day | ORAL | 3 refills | Status: AC
Start: 2023-12-18 — End: 2024-12-12

## 2023-12-18 MED ORDER — CHOLECALCIFEROL 50 MCG (2000 UT) PO CAPS
2000.0000 [IU] | ORAL_CAPSULE | Freq: Every day | ORAL | 3 refills | Status: AC
Start: 2023-12-18 — End: 2024-12-12

## 2023-12-18 NOTE — Telephone Encounter (Signed)
 Copied from CRM [id number]. Topic: Clinical - Prescription Issue >> Dec 18, 2023  4:09 PM Ashley R wrote: Reason for CRM: hydrocortisone  2.5 % ointment discussed during appointment but not ordered. No available at pharmacy and would like to pick up today.

## 2023-12-18 NOTE — Telephone Encounter (Signed)
 I spoke with Dr Sebastian and he notes that it was decided to send in a different cream for the pt. He sent in Mometasone. This cream is a bit stronger than the hydrocortisone  that was requested. I called pt and left a message for him explaining this and asked that he call if he has any questions.

## 2023-12-18 NOTE — Patient Instructions (Addendum)
 It was very nice to see you today!  For constipation, try the Linzess.  For neck pain, we have referred you to neurosurgery.  The office will help schedule the appointment.  Blood pressure looks great.  Continue current medicines  We are getting blood work.       Take care, Arvella Hummer, MD, MS   PLEASE NOTE:  If you had any lab tests, please let us  know if you have not heard back within a few days. You may see your results on mychart before we have a chance to review them but we will give you a call once they are reviewed by us .   If we ordered any referrals today, please let us  know if you have not heard from their office within the next week.   If you had any urgent prescriptions sent in today, please check with the pharmacy within an hour of our visit to make sure the prescription was transmitted appropriately.   Please try these tips to maintain a healthy lifestyle:  Eat at least 3 REAL meals and 1-2 snacks per day.  Aim for no more than 5 hours between eating.  If you eat breakfast, please do so within one hour of getting up.   Each meal should contain half fruits/vegetables, one quarter protein, and one quarter carbs (no bigger than a computer mouse)  Cut down on sweet beverages. This includes juice, soda, and sweet tea.   Drink at least 1 glass of water  with each meal and aim for at least 8 glasses per day  Exercise at least 150 minutes every week.  He is good to go thank you yes

## 2023-12-18 NOTE — Progress Notes (Signed)
 Assessment  Assessment/Plan:  Assessment and Plan Assessment & Plan Resistant hypertension Blood pressure is at goal with home readings of 137/72 mmHg. Current medication regimen includes atenolol , doxazosin , losartan -hydrochlorothiazide , and amlodipine . No chest pain or shortness of breath reported. - Continue current antihypertensive medications - Check creatinine, electrolytes, urinalysis with microalbumin creatinine ratio - Screen for diabetes with hemoglobin A1c and random glucose - Check TSH to assess for thyroid contribution to hypertension  Chronic right shoulder pain and cervical spinal stenosis Chronic right shoulder pain exacerbated by a tree fall and subsequent injuries. Previous imaging showed mild dysplasia and abnormal coracoid process. Cervical spinal stenosis with significant degenerative disc changes noted in cervical spine MRI from 2021. Pain possibly related to neck issues. Previous physical therapy and chiropractic care provided some relief. He is hesitant about surgery and prefers non-surgical options. - Order right shoulder x-ray - Recommend topical Voltaren gel - Consider referral to physical therapy or orthopedics based on interest - Refer to neurosurgery for further evaluation of cervical spinal stenosis  Chronic constipation Bowel movements every 3-4 days with loose stools when using Miralax . No blood in stool or significant abdominal pain. Previous colonoscopies performed, but not recently due to age. Interested in trying Linzess for more regular bowel movements. - Prescribe Linzess 72 mcg once daily before breakfast for 30 days - Consider referral to gastroenterology if symptoms persist  Iron deficiency anemia Iron deficiency anemia managed with ferrous sulfate 325 mg daily. - Check CBC, iron studies, B12, and folate - Refill ferrous sulfate  Vitamin D deficiency Vitamin D deficiency managed with calcifediol 50 mcg daily. - Recheck vitamin D  levels  Benign prostatic hyperplasia BPH managed with finasteride  and doxazosin . - Check urinalysis and PSA - Refill finasteride  and doxazosin   Mixed hyperlipidemia Managed with rosuvastatin 5 mg daily. - Recheck lipid panel and LFTs  Eczematous otitis externa, bilateral Chronic eczema of the ears with previous use of topical corticosteroids. Difficulty managing symptoms. - Prescribe mometasone 0.1% cream for both ears twice daily for 14 days - Consider referral to otolaryngology if symptoms worsen  Allergic rhinitis due to pollen Managed with cetirizine 10 mg as needed for seasonal allergies. - Refill cetirizine  General Health Maintenance Aspirin 81 mg daily for primary cardiac prevention. - Continue aspirin 81 mg daily     Medications Discontinued During This Encounter  Medication Reason   amLODipine  (NORVASC ) 10 MG tablet    COVID-19 mRNA Vac-TriS, Pfizer, (PFIZER-BIONT COVID-19 VAC-TRIS) SUSP injection    methocarbamol  (ROBAXIN ) 500 MG tablet    diazepam  (VALIUM ) 5 MG tablet    doxazosin  (CARDURA ) 8 MG tablet Reorder   finasteride  (PROSCAR ) 5 MG tablet Reorder   atenolol  (TENORMIN ) 50 MG tablet Reorder   aspirin EC 81 MG tablet Reorder   Cetirizine HCl (ZYRTEC ALLERGY) 10 MG CAPS Reorder   Cholecalciferol 50 MCG (2000 UT) CAPS Reorder   ferrous sulfate 325 (65 FE) MG tablet Reorder   rosuvastatin (CRESTOR) 5 MG tablet Reorder   losartan -hydrochlorothiazide  (HYZAAR) 100-25 MG tablet Reorder   hydrocortisone  2.5 % ointment    Return in about 1 month (around 01/18/2024) for Constipation.        Subjective:   Encounter date: 12/18/2023  Chief Complaint  Patient presents with   Hyperlipidemia   Hypertension    1 month follow up. Pt is not fasting today. Pt blood pressure reading at home range from 137/72  HM due- vaccnations ( Pt declined)    Rash    Pt states eczema  inside of right and left ear is still present.    Shoulder Pain    Pt c/o of right  shoulder pain for 5 years due injury     Discussed the use of AI scribe software for clinical note transcription with the patient, who gave verbal consent to proceed.  History of Present Illness 10/31/2023  [name] is an 88 year old male who presents to establish care.  He has ongoing vision issues, including bilateral ptosis and glaucoma, and follows with ophthalmology. His current medications include brimonidine , prednate, dorsolide, timolol , ketorolac  0.4%, latanoprost, neomycin-polymyxin B-dexamethasone , prednisolone  acetate, sodium chloride  5% (Muro 128), trimethoprim-polymyxin B, and tropicamide ophthalmic drops. He occasionally uses neomycin-polymyxin B-dexamethasone  drops and sodium chloride  5% drops.  He has a history of hypertension. His current medications for hypertension include amlodipine  10 mg, doxazosin  8 mg, and losartan -hydrochlorothiazide  100-25 mg daily. He also takes finasteride  5 mg for benign prostatic hyperplasia (BPH).  He has a history of iron deficiency anemia for which he takes sulfate supplements. Additionally, he is on rosuvastatin 5 mg for hyperlipidemia.  He experiences spinal stenosis in the cervical region and uses methocarbamol  as needed for symptom relief.  He has bilateral ear itching, which he manages with sweet oil. He uses a hearing aid in his right ear but forgot to wear it today. No pain or drainage from the ears.  He has a history of bilateral knee replacements and reports no current pain or issues with his knees.  He monitors his blood pressure regularly, checking it three times a day, and adjusts his water  intake to prevent hypotension. He reports nocturia, which varies with his water  intake, and has a history of urinary issues that have been managed with saw palmetto supplements for the past 30 years.  12/18/2023    [name] is an 88 year old male with resistant hypertension who presents for a blood pressure follow-up and lab  work.  Hypertension - Resistant hypertension managed with atenolol  25 mg BID, doxazosin  8 mg QHS, losartan -hydrochlorothiazide  100-25 mg daily, and amlodipine  5 mg daily - Home blood pressure readings average 137/72 mmHg  Chronic right shoulder pain - Chronic right shoulder pain for several years following trauma from a tree falling incident - Imaging revealed mild dysplasia and abnormal coracoid process - History of multiple falls and injuries contributing to current symptoms - Previous treatments include physical therapy and chiropractic care with variable benefit  Constipation - Bowel movements every three to four days - Uses Miralax , resulting in loose stools - No blood in stool or significant abdominal pain  Benign prostatic hyperplasia - Managed with finasteride  5 mg daily and doxazosin  8 mg QHS  Glaucoma - Glaucoma in the right eye managed with multiple ophthalmic drops - Follows with ophthalmology  Iron deficiency anemia - Treated with ferrous sulfate 325 mg daily  Vitamin d deficiency - Treated with calcifediol 50 micrograms daily  Hyperlipidemia - Managed with rosuvastatin 5 mg daily  Eczema of the ear - History of eczema of the ear treated with topical corticosteroids  Allergic rhinitis - Uses cetirizine 10 mg QHS as needed for seasonal allergies       12/18/2023   11:08 AM 10/31/2023   11:01 AM 08/22/2017    1:35 PM  Depression screen PHQ 2/9  Decreased Interest 0 1 0  Down, Depressed, Hopeless 0 1 0  PHQ - 2 Score 0 2 0  Altered sleeping  0   Tired, decreased energy  0   Change in  appetite  0   Feeling bad or failure about yourself   0   Trouble concentrating  0   Moving slowly or fidgety/restless  0   Suicidal thoughts  0   PHQ-9 Score  2   Difficult doing work/chores  Somewhat difficult        10/31/2023   11:01 AM  GAD 7 : Generalized Anxiety Score  Nervous, Anxious, on Edge 0  Control/stop worrying 0  Worry too much - different things 0   Trouble relaxing 0  Restless 0  Easily annoyed or irritable 0  Afraid - awful might happen 0  Total GAD 7 Score 0  Anxiety Difficulty Not difficult at all    Health Maintenance Due  Topic Date Due   Medicare Annual Wellness (AWV)  Never done     PMH:  The following were reviewed and entered/updated in epic: Past Medical History:  Diagnosis Date   Anemia    Arthritis    Complication of anesthesia    HARD TO WAKE UP   Constipation    DJD (degenerative joint disease)    Glaucoma    Headache    hx of   Heart murmur    History of kidney stones    HTN (hypertension)    Otitis media    with intermittent vertigo   Pneumonia    PONV (postoperative nausea and vomiting)    Weakness    of neck muscles    Patient Active Problem List   Diagnosis Date Noted   Eczema of both external ears 10/31/2023   Mixed hyperlipidemia 10/31/2023   Spinal stenosis of cervical region 06/12/2019   BPH (benign prostatic hyperplasia) 08/22/2017   Arthralgia, neck 05/20/2014   Arthritis    OA (osteoarthritis) of knee 08/05/2012   Allergic rhinitis 03/29/2012   Glaucoma 03/29/2012   HTN (hypertension)    DJD (degenerative joint disease)    Ptosis of eyelid 04/18/2011    Past Surgical History:  Procedure Laterality Date   BACK SURGERY  2000   Carpal Tunnel Left    03/2014   CARPAL TUNNELL  1970'S   EYE SURGERY  06/10/2009 / 07/17/12   glaucoma    TONSILLECTOMY     TOTAL KNEE ARTHROPLASTY Left 08/05/2012   Procedure: LEFT TOTAL KNEE ARTHROPLASTY;  Surgeon: Dempsey LULLA Moan, MD;  Location: [facility];  Service: Orthopedics;  Laterality: Left;   TOTAL KNEE ARTHROPLASTY Right 09/25/2016   Procedure: RIGHT TOTAL KNEE ARTHROPLASTY;  Surgeon: Moan Dempsey, MD;  Location: [facility];  Service: Orthopedics;  Laterality: Right;    Family History  Problem Relation Age of Onset   Arthritis Mother    Glaucoma Mother    Hypertension Mother    GER disease Father    Hypertension Father    Dementia Sister     Cancer Sister        unknown   Hyperlipidemia Sister    Diabetes Brother    Hypertension Brother    ALS Brother    Cancer Brother        unknown   Dementia Brother    Heart disease Brother    Hypertension Brother    Diabetes Daughter    Diabetes Daughter    Diabetes Son     Medications- reviewed and updated Outpatient Medications Prior to Visit  Medication Sig Dispense Refill   amLODipine  (NORVASC ) 5 MG tablet Take 5 mg by mouth daily.     acetaminophen  (TYLENOL ) 325 MG tablet Take 2 tablets (650  mg total) by mouth every 6 (six) hours as needed for moderate pain. 30 tablet 0   baclofen (LIORESAL) 10 MG tablet Take 10 mg by mouth 3 (three) times daily as needed for muscle spasms.     brimonidine  (ALPHAGAN ) 0.2 % ophthalmic solution Place 1 drop into both eyes 2 (two) times daily.      Difluprednate  0.05 % EMUL Apply 1 drop to eye.     dorzolamide -timolol  (COSOPT ) 22.3-6.8 MG/ML ophthalmic solution Place 1 drop into both eyes 2 (two) times daily.      ketorolac  (ACULAR  LS) 0.4 % SOLN Apply 1 drop to eye.     latanoprost (XALATAN) 0.005 % ophthalmic solution SMARTSIG:In Eye(s)     Multiple Vitamin (MULTI-VITAMIN) tablet Take by mouth.     neomycin-polymyxin b-dexamethasone  (MAXITROL) 3.5-10000-0.1 OINT SMARTSIG:sparingly Left Eye Every Night     prednisoLONE  acetate (PRED FORTE ) 1 % ophthalmic suspension Place 1 drop into both eyes daily.     sodium chloride  (MURO 128) 5 % ophthalmic ointment Apply 1 Application to eye.     sodium chloride  (MURO 128) 5 % ophthalmic solution Apply 1 drop to eye.     trimethoprim-polymyxin b (POLYTRIM) ophthalmic solution 1 drop 4 times a day for 3 days LEFT eye     tropicamide (MYDRIACYL) 1 % ophthalmic solution Apply 1 drop to eye.     amLODipine  (NORVASC ) 10 MG tablet Take 10 mg by mouth daily.     aspirin EC 81 MG tablet Take by mouth.     atenolol  (TENORMIN ) 50 MG tablet Take 25 mg by mouth 2 (two) times daily.      Cetirizine HCl (ZYRTEC  ALLERGY) 10 MG CAPS Take 10 mg by mouth.     Cholecalciferol 50 MCG (2000 UT) CAPS Take 2,000 Units by mouth.     COVID-19 mRNA Vac-TriS, Pfizer, (PFIZER-BIONT COVID-19 VAC-TRIS) SUSP injection Inject into the muscle. 0.3 mL 0   diazepam  (VALIUM ) 5 MG tablet Take 1 tablet (5 mg total) by mouth 2 (two) times daily. 10 tablet 0   doxazosin  (CARDURA ) 8 MG tablet Take 8 mg by mouth at bedtime.      ferrous sulfate 325 (65 FE) MG tablet Take 325 mg by mouth.     finasteride  (PROSCAR ) 5 MG tablet Take 5 mg by mouth at bedtime.      hydrocortisone  2.5 % ointment Apply to each ear canal with cotton swab two times daily. Do not use for mor than 2 weeks at a time. 30 g 3   losartan -hydrochlorothiazide  (HYZAAR) 100-25 MG tablet Take 1 tablet by mouth daily. If BP is below 139 will not take med. If BP is 140 - takes med 90 tablet 3   methocarbamol  (ROBAXIN ) 500 MG tablet Take 1 tablet (500 mg total) by mouth 2 (two) times daily. 20 tablet 0   rosuvastatin (CRESTOR) 5 MG tablet Take 5 mg by mouth.     No facility-administered medications prior to visit.    Allergies  Allergen Reactions   Celebrex [Celecoxib] Other (See Comments)    ran my b/p up high, almost had a stroke blood pressure   Doxycycline Anaphylaxis and Itching   Ivp Dye [Iodinated Contrast Media] Other (See Comments)    started burning like fire burning   Bextra [Valdecoxib] Other (See Comments)    Pt passed out Increase blood pressure   Chlorpromazine     Pt states he thinks he becomes dizzy at times, but hard to recall at this time  Ciprofloxacin     Unknown    Cyclopentolate Other (See Comments)    Pt is not to use eye drops due to adverse reactions   Flomax [Tamsulosin Hcl] Other (See Comments)    Blurred vision   Hyoscyamine Other (See Comments)    I pass out   Imuran [Azathioprine] Other (See Comments)    Patient unable to provide any additional info.  Not sure of this drug at all even after reviewing possible  uses. Passed out   Methotrexate Other (See Comments)    Caused anemia   Methotrexate And Trimetrexate     Killed red blood cells, zombie like, killed immune system    Nasal Spray Other (See Comments)    Blurred vision   Nasal Spray Other (See Comments)   Penicillins Other (See Comments)    Problems with urination and GI problems   Timolol  Other (See Comments)    Bradycardia, but patient on oral betablocker as well.    Social History   Socioeconomic History   Marital status: Widowed    Spouse name: Not on file   Number of children: 3   Years of education: 4 th   Highest education level: Not on file  Occupational History    Comment: rettired.  Tobacco Use   Smoking status: Never   Smokeless tobacco: Never  Vaping Use   Vaping status: Never Used  Substance and Sexual Activity   Alcohol use: No    Alcohol/week: 0.0 standard drinks of alcohol   Drug use: No   Sexual activity: Never  Other Topics Concern   Not on file  Social History Narrative   Patient lives at home alone and he is widowed. Had 13 brothers and sisters. Has 3 children.   Retired.   Education high school.   Right handed.   Caffeine Very rare soda and one coffee.   Enjoys bowling, not able to play after knee replacements   Social Drivers of Corporate investment banker Strain: Not on file  Food Insecurity: Not on file  Transportation Needs: Not on file  Physical Activity: Not on file  Stress: Not on file  Social Connections: Not on file           Objective:  Physical Exam: BP 137/72 Comment: @home  reading  Pulse 70   Temp (!) 97.1 F (36.2 C) (Temporal)   Resp 18   Wt 201 lb (91.2 kg)   SpO2 100%   BMI 25.81 kg/m   Body mass index is 25.81 kg/m. Wt Readings from Last 3 Encounters:  12/18/23 201 lb (91.2 kg)  10/31/23 195 lb 3.2 oz (88.5 kg)  07/11/19 204 lb (92.5 kg)   BP Readings from Last 3 Encounters:  12/18/23 137/72  10/31/23 136/61  03/15/21 (!) 172/64   Patient  presents today for blood pressure follow-up along with needing lab work patient has history of vitamin D deficiency takes Calciferol 50 mcg daily Physical Exam VITALS: BP- 136/61 GENERAL: Alert, cooperative, well developed, no acute distress. HEENT: Normocephalic, normal oropharynx, moist mucous membranes, no infection in ears. CHEST: Clear to auscultation bilaterally, no wheezes, rhonchi, or crackles. CARDIOVASCULAR: Normal heart rate and rhythm, S1 and S2 normal without murmurs. ABDOMEN: Soft, non-tender, non-distended, without organomegaly, normal bowel sounds. EXTREMITIES: No cyanosis or edema. NEUROLOGICAL: Cranial nerves grossly intact, moves all extremities without gross motor or sensory deficit.   GENERAL: Alert, cooperative, well developed, no acute distress. HEENT: Normocephalic, normal oropharynx, moist mucous membranes. NECK: Non-tender. CHEST: Clear  to auscultation bilaterally, no wheezes, rhonchi, or crackles. CARDIOVASCULAR: Normal heart rate and rhythm, S1 and S2 normal without murmurs. ABDOMEN: Soft, non-tender, non-distended, without organomegaly, normal bowel sounds. EXTREMITIES: No cyanosis or edema. NEUROLOGICAL: Cranial nerves grossly intact, moves all extremities without gross motor or sensory deficit.  Physical Exam      Prior labs:   No results found for this or any previous visit (from the past 2160 hours).  No results found for: CHOL No results found for: HDL No results found for: LDLCALC No results found for: TRIG No results found for: CHOLHDL No results found for: LDLDIRECT  Last metabolic panel Lab Results  Component Value Date   GLUCOSE 98 06/03/2019   NA 142 06/03/2019   K 3.7 06/03/2019   CL 107 06/03/2019   CO2 25 06/03/2019   BUN 10 06/03/2019   CREATININE 0.92 06/03/2019   GFRNONAA >60 06/03/2019   CALCIUM 9.4 06/03/2019   PROT 7.3 09/18/2016   ALBUMIN 3.8 09/18/2016   BILITOT 0.7 09/18/2016   ALKPHOS 67 09/18/2016    AST 29 09/18/2016   ALT 19 09/18/2016   ANIONGAP 10 06/03/2019    No results found for: HGBA1C  Last CBC Lab Results  Component Value Date   WBC 5.8 06/03/2019   HGB 13.2 06/03/2019   HCT 38.0 (L) 06/03/2019   MCV 90.7 06/03/2019   MCH 31.5 06/03/2019   RDW 14.0 06/03/2019   PLT 172 06/03/2019    No results found for: TSH  No results found for: PSA1, PSA  Last vitamin D No results found for: MARIEN BOLLS, VD25OH  Lab Results  Component Value Date   BILIRUBINUR NEGATIVE 09/27/2016   PROTEINUR NEGATIVE 09/27/2016   UROBILINOGEN 0.2 07/29/2012   LEUKOCYTESUR NEGATIVE 09/27/2016    No results found for: LABMICR, MICROALBUR   At today's visit, we discussed treatment options, associated risk and benefits, and engage in counseling as needed.  Additionally the following were reviewed: Past medical records, past medical and surgical history, family and social background, as well as relevant laboratory results, imaging findings, and specialty notes, where applicable.  This message was generated using dictation software, and as a result, it may contain unintentional typos or errors.  Nevertheless, extensive effort was made to accurately convey at the pertinent aspects of the patient visit.    There may have been are other unrelated non-urgent complaints, but due to the busy schedule and the amount of time already spent with him, time does not permit to address these issues at today's visit. Another appointment may have or has been requested to review these additional issues.     Arvella Hummer, MD, MS

## 2023-12-19 LAB — URINALYSIS W MICROSCOPIC + REFLEX CULTURE
Bacteria, UA: NONE SEEN /HPF
Bilirubin Urine: NEGATIVE
Glucose, UA: NEGATIVE
Hgb urine dipstick: NEGATIVE
Leukocyte Esterase: NEGATIVE
Nitrites, Initial: NEGATIVE
Protein, ur: NEGATIVE
RBC / HPF: NONE SEEN /HPF (ref 0–2)
Specific Gravity, Urine: 1.022 (ref 1.001–1.035)
Squamous Epithelial / HPF: NONE SEEN /HPF (ref <=5)
WBC, UA: NONE SEEN /HPF (ref 0–5)
pH: 7.5 (ref 5.0–8.0)

## 2023-12-19 LAB — NO CULTURE INDICATED

## 2023-12-20 ENCOUNTER — Telehealth: Payer: Self-pay

## 2023-12-20 NOTE — Telephone Encounter (Signed)
 Contacted patient to inform him his results has not been resulted yet. Pt was unable to reach. LVM telling him we will call back when we have the results

## 2023-12-20 NOTE — Telephone Encounter (Signed)
 Copied from CRM [id number]. Topic: Clinical - Lab/Test Results >> Dec 20, 2023 10:55 AM Roselie BROCKS wrote: Reason for CRM: Patient called wanting results for lab work, and request a call back

## 2023-12-21 LAB — IRON,TIBC AND FERRITIN PANEL
%SAT: 23 % (ref 20–48)
Ferritin: 218 ng/mL (ref 24–380)
Iron: 68 ug/dL (ref 50–180)
TIBC: 300 ug/dL (ref 250–425)

## 2023-12-21 LAB — VITAMIN D 1,25 DIHYDROXY
Vitamin D 1, 25 (OH)2 Total: 30 pg/mL (ref 18–72)
Vitamin D2 1, 25 (OH)2: <8 pg/mL
Vitamin D3 1, 25 (OH)2: 30 pg/mL

## 2023-12-25 ENCOUNTER — Telehealth: Payer: Self-pay

## 2023-12-25 DIAGNOSIS — H60543 Acute eczematoid otitis externa, bilateral: Secondary | ICD-10-CM

## 2023-12-25 NOTE — Telephone Encounter (Signed)
 Forwarding message below. LOV 12/18/2023

## 2023-12-25 NOTE — Telephone Encounter (Unsigned)
 Copied from CRM #[id number]. Topic: Clinical - Lab/Test Results >> Dec 25, 2023  3:04 PM Shereese L wrote: Reason for CRM: Patient called in and stated that he needs a call back to go over his results. He stated that he has called in multiple times

## 2023-12-25 NOTE — Telephone Encounter (Signed)
 Copied from CRM [id number]. Topic: Clinical - Lab/Test Results >> Dec 24, 2023  4:09 PM Alfonso ORN wrote: Reason for CRM: Please contact pt back with more information / go over lab results

## 2023-12-25 NOTE — Telephone Encounter (Signed)
 Copied from CRM [id number]. Topic: Clinical - Prescription Issue >> Dec 25, 2023 10:29 AM Aleatha BROCKS wrote: Reason for CRM: [facility] needed to verify the directions for the losartan -hydrochlorothiazide  (HYZAAR) 100-25 MG tablet,   Phone #[phone] ref #[id number]

## 2023-12-25 NOTE — Telephone Encounter (Signed)
 Forwarding message below. SIG in unclear. Please advise on RX directions.

## 2023-12-26 ENCOUNTER — Ambulatory Visit: Payer: Self-pay | Admitting: Family Medicine

## 2023-12-26 MED ORDER — LOSARTAN POTASSIUM-HCTZ 100-25 MG PO TABS: 1.0000 | ORAL_TABLET | Freq: Every day | ORAL | 3 refills | Status: AC

## 2023-12-26 MED ORDER — LOSARTAN POTASSIUM-HCTZ 100-25 MG PO TABS: 1.0000 | ORAL_TABLET | Freq: Every day | ORAL | 3 refills | Status: DC

## 2023-12-26 NOTE — Addendum Note (Signed)
 Addended by: SEBASTIAN RIGHTER B on: 12/26/2023 01:53 PM   Modules accepted: Orders

## 2023-12-27 NOTE — Telephone Encounter (Signed)
 Results were sent to patient via mail as requested and placed in outgoing mail at front desk on 12/27/2023 @ 3:15pm

## 2023-12-27 NOTE — Telephone Encounter (Signed)
 Copied from CRM [id number]. Topic: Clinical - Lab/Test Results >> Dec 27, 2023 10:09 AM Carlyon D wrote: Reason for CRM: Pt Is calling in regards to his lab results and instructions he was given from PCP. Pt ould like a print of everything and he would look it mailed to his home address.

## 2024-01-07 DIAGNOSIS — M47812 Spondylosis without myelopathy or radiculopathy, cervical region: Secondary | ICD-10-CM | POA: Diagnosis not present

## 2024-01-07 DIAGNOSIS — Z6825 Body mass index (BMI) 25.0-25.9, adult: Secondary | ICD-10-CM | POA: Diagnosis not present

## 2024-01-18 ENCOUNTER — Ambulatory Visit (INDEPENDENT_AMBULATORY_CARE_PROVIDER_SITE_OTHER): Payer: Medicare (Managed Care) | Admitting: Family Medicine

## 2024-01-18 VITALS — BP 132/60 | HR 59 | Temp 97.6°F | Wt 196.2 lb

## 2024-01-18 DIAGNOSIS — D509 Iron deficiency anemia, unspecified: Secondary | ICD-10-CM

## 2024-01-18 DIAGNOSIS — M4802 Spinal stenosis, cervical region: Secondary | ICD-10-CM | POA: Diagnosis not present

## 2024-01-18 DIAGNOSIS — D513 Other dietary vitamin B12 deficiency anemia: Secondary | ICD-10-CM

## 2024-01-18 DIAGNOSIS — K5904 Chronic idiopathic constipation: Secondary | ICD-10-CM | POA: Diagnosis not present

## 2024-01-18 DIAGNOSIS — I1A0 Resistant hypertension: Secondary | ICD-10-CM

## 2024-01-18 MED ORDER — VITAMIN B-12 1000 MCG PO TABS
ORAL_TABLET | ORAL | 3 refills | Status: AC
Start: 2024-01-18 — End: ?

## 2024-01-18 MED ORDER — LINACLOTIDE 72 MCG PO CAPS
ORAL_CAPSULE | ORAL | 3 refills | Status: AC
Start: 2024-01-18 — End: ?

## 2024-01-18 MED ORDER — AMLODIPINE BESYLATE 5 MG PO TABS: ORAL_TABLET | ORAL | 3 refills | Status: AC

## 2024-01-18 NOTE — Progress Notes (Signed)
 Assessment & Plan   Assessment/Plan:    Assessment & Plan Chronic constipation Managed with Linzess  72 mcg daily, causing significant urgency and discomfort. Previous treatments with Miralax  were ineffective. Linzess  is effective but requires staying home for three hours post-ingestion due to urgency. - Adjust Linzess  to as-needed basis, especially if no bowel movement in two days. - Encouraged increased water  intake and consumption of fruits and vegetables. - Will consider Miralax  if needed between Linzess  doses.  Iron & vitamin B12 deficiency anemia Mild iron deficiency anemia with hemoglobin at 12.7 g/dL. Previously on methotrexate, discontinued due to suspected contribution to anemia. Currently on iron supplementation and dietary modifications. Reports fatigue and excessive sleepiness, possibly related to anemia. - Continue iron supplementation with ferrous sulfate  325 mg daily. - Start taking cyanocobalamin  1000 mcg daily - Encouraged dietary intake of iron-rich foods, including lean beef, spinach, and turnip greens. - Advised taking iron with orange juice to enhance absorption. - Will recheck blood work in three months to assess anemia status.  Cervical spine, spinal stenosis Managed with physical therapy and injections. Surgery deemed not viable. Awaiting contact from physical therapy provider for further management. - Provided contact information for neurosurgery specialist for physical therapy and injections.  Hypertension Blood pressure is well-controlled at this visit. - Continue amlodipine  5 mg daily, refilled today and - Continue losartan -hydrochlorothiazide  100-25 mg daily, atenolol  25 mg daily, doxazosin  8 mg daily     There are no discontinued medications.  Return for BP, anemia, constipation.        Subjective:   Encounter date: 01/18/2024  [name] is a 88 y.o. male who has HTN (hypertension); DJD (degenerative joint disease); Allergic rhinitis;  Glaucoma; OA (osteoarthritis) of knee; Arthritis; Arthralgia, neck; Ptosis of eyelid; BPH (benign prostatic hyperplasia); Spinal stenosis of cervical region; Eczema of both external ears; and Mixed hyperlipidemia on their problem list..   He  has a past medical history of Anemia, Arthritis, Complication of anesthesia, Constipation, DJD (degenerative joint disease), Glaucoma, Headache, Heart murmur, History of kidney stones, HTN (hypertension), Otitis media, Pneumonia, PONV (postoperative nausea and vomiting), and Weakness.SABRA   He presents with chief complaint of Constipation (Pt is here for a 1 mon f/u for constipation. Pt states with the medication he stays home until he use the restroom. Pt states its hard for him because he has to stay home for 3 hours after taking the medication.  Pt states the medication works but taking it everyday before breakfast is a bit much for him) .  Discussed the use of AI scribe software for clinical note transcription with the patient, who gave verbal consent to proceed.  History of Present Illness [name] is an 88 year old male with chronic constipation who presents for follow-up.  Chronic constipation - Bowel movements occur every four days with associated discomfort. - Previously used Miralax , which was ineffective and took approximately three days to produce a bowel movement without consistent relief. - Currently takes Linzess  72 mcg once daily before breakfast, which is effective but causes urgency and increased frequency of bowel movements. - Linzess  regimen requires him to stay home for about three hours after dosing due to bowel urgency, making daily management challenging.  Anemia and fatigue - History of borderline anemia secondary to iron and B12 deficiency - Recently resumed ferrous sulfate  325 mg daily after a period of discontinuation. - No gastrointestinal side effects from iron supplementation. - Experiences fatigue and falls asleep  easily. - Consumes iron-rich foods and  takes iron pills with orange juice to enhance absorption. - Not currently taking a B12 supplement  Adverse effects from methotrexate - History of methotrexate use, believed to have contributed to anemia. - Discontinued methotrexate several years ago due to adverse effects and high cost.  Cervical spine pain and dysfunction - History of neck issues. - Evaluated by a spine specialist who recommended physical therapy and possibly injections for symptom relief. - Surgery was not considered an option. - Awaiting further contact regarding physical therapy and injection treatments.    ROS  Past Surgical History:  Procedure Laterality Date   BACK SURGERY  2000   Carpal Tunnel Left    03/2014   CARPAL TUNNELL  1970'S   EYE SURGERY  06/10/2009 / 07/17/12   glaucoma    TONSILLECTOMY     TOTAL KNEE ARTHROPLASTY Left 08/05/2012   Procedure: LEFT TOTAL KNEE ARTHROPLASTY;  Surgeon: Dempsey LULLA Moan, MD;  Location: [facility];  Service: Orthopedics;  Laterality: Left;   TOTAL KNEE ARTHROPLASTY Right 09/25/2016   Procedure: RIGHT TOTAL KNEE ARTHROPLASTY;  Surgeon: Moan Dempsey, MD;  Location: [facility];  Service: Orthopedics;  Laterality: Right;    Current Outpatient Medications on File Prior to Visit  Medication Sig Dispense Refill   acetaminophen  (TYLENOL ) 325 MG tablet Take 2 tablets (650 mg total) by mouth every 6 (six) hours as needed for moderate pain. 30 tablet 0   amLODipine  (NORVASC ) 5 MG tablet Take 5 mg by mouth daily.     aspirin  EC 81 MG tablet Take 1 tablet (81 mg total) by mouth daily. 90 tablet 3   atenolol  (TENORMIN ) 25 MG tablet Take 1 tablet (25 mg total) by mouth 2 (two) times daily. 180 tablet 3   baclofen (LIORESAL) 10 MG tablet Take 10 mg by mouth 3 (three) times daily as needed for muscle spasms.     brimonidine  (ALPHAGAN ) 0.2 % ophthalmic solution Place 1 drop into both eyes 2 (two) times daily.      Cetirizine  HCl (ZYRTEC  ALLERGY ) 10 MG  CAPS Take 1 capsule (10 mg total) by mouth at bedtime as needed (Allergy ). 90 capsule 3   Cholecalciferol  50 MCG (2000 UT) CAPS Take 1 capsule (2,000 Units total) by mouth daily in the afternoon. 90 capsule 3   Difluprednate  0.05 % EMUL Apply 1 drop to eye.     dorzolamide -timolol  (COSOPT ) 22.3-6.8 MG/ML ophthalmic solution Place 1 drop into both eyes 2 (two) times daily.      doxazosin  (CARDURA ) 8 MG tablet Take 1 tablet (8 mg total) by mouth at bedtime. 90 tablet 3   ferrous sulfate  325 (65 FE) MG tablet Take 1 tablet (325 mg total) by mouth daily with breakfast. 90 tablet 3   finasteride  (PROSCAR ) 5 MG tablet Take 1 tablet (5 mg total) by mouth at bedtime. 90 tablet 3   ketorolac  (ACULAR  LS) 0.4 % SOLN Apply 1 drop to eye.     latanoprost (XALATAN) 0.005 % ophthalmic solution SMARTSIG:In Eye(s)     linaclotide  (LINZESS ) 72 MCG capsule Take 1 capsule (72 mcg total) by mouth daily before breakfast. 30 capsule 0   losartan -hydrochlorothiazide  (HYZAAR) 100-25 MG tablet Take 1 tablet by mouth daily. If BP is below 139 will not take med. If BP is 140 - takes med 90 tablet 3   mometasone  (ELOCON ) 0.1 % cream Apply to each ear twice a day for up to 14 days.  May repeat if necessary 15 g 1   Multiple Vitamin (MULTI-VITAMIN) tablet  Take by mouth.     neomycin-polymyxin b-dexamethasone  (MAXITROL) 3.5-10000-0.1 OINT SMARTSIG:sparingly Left Eye Every Night     prednisoLONE  acetate (PRED FORTE ) 1 % ophthalmic suspension Place 1 drop into both eyes daily.     rosuvastatin  (CRESTOR ) 5 MG tablet Take 1 tablet (5 mg total) by mouth daily. 90 tablet 3   sodium chloride  (MURO 128) 5 % ophthalmic ointment Apply 1 Application to eye.     sodium chloride  (MURO 128) 5 % ophthalmic solution Apply 1 drop to eye.     trimethoprim-polymyxin b (POLYTRIM) ophthalmic solution 1 drop 4 times a day for 3 days LEFT eye     tropicamide (MYDRIACYL) 1 % ophthalmic solution Apply 1 drop to eye.     No current  facility-administered medications on file prior to visit.    Family History  Problem Relation Age of Onset   Arthritis Mother    Glaucoma Mother    Hypertension Mother    GER disease Father    Hypertension Father    Dementia Sister    Cancer Sister        unknown   Hyperlipidemia Sister    Diabetes Brother    Hypertension Brother    ALS Brother    Cancer Brother        unknown   Dementia Brother    Heart disease Brother    Hypertension Brother    Diabetes Daughter    Diabetes Daughter    Diabetes Son     Social History   Socioeconomic History   Marital status: Widowed    Spouse name: Not on file   Number of children: 3   Years of education: 55 th   Highest education level: Not on file  Occupational History    Comment: rettired.  Tobacco Use   Smoking status: Never   Smokeless tobacco: Never  Vaping Use   Vaping status: Never Used  Substance and Sexual Activity   Alcohol use: No    Alcohol/week: 0.0 standard drinks of alcohol   Drug use: No   Sexual activity: Never  Other Topics Concern   Not on file  Social History Narrative   Patient lives at home alone and he is widowed. Had 13 brothers and sisters. Has 3 children.   Retired.   Education high school.   Right handed.   Caffeine Very rare soda and one coffee.   Enjoys bowling, not able to play after knee replacements   Social Drivers of Corporate Investment Banker Strain: Not on file  Food Insecurity: Not on file  Transportation Needs: Not on file  Physical Activity: Not on file  Stress: Not on file  Social Connections: Not on file  Intimate Partner Violence: Not on file                                                                                                  Objective:  Physical Exam: BP 132/60   Pulse (!) 59   Temp 97.6 F (36.4 C)   Wt 196 lb 3.2 oz (89 kg)   SpO2 100%  BMI 25.19 kg/m    Physical Exam           Physical Exam Constitutional:      Appearance: Normal  appearance.  HENT:     Head: Normocephalic and atraumatic.     Right Ear: Hearing normal.     Left Ear: Hearing normal.     Nose: Nose normal.  Eyes:     General: No scleral icterus.       Right eye: No discharge.        Left eye: No discharge.     Extraocular Movements: Extraocular movements intact.  Cardiovascular:     Rate and Rhythm: Normal rate and regular rhythm.     Heart sounds: Normal heart sounds.  Pulmonary:     Effort: Pulmonary effort is normal.     Breath sounds: Normal breath sounds.  Abdominal:     Palpations: Abdomen is soft.     Tenderness: There is no abdominal tenderness.  Skin:    General: Skin is warm.     Findings: No rash.  Neurological:     General: No focal deficit present.     Mental Status: He is alert.     Cranial Nerves: No cranial nerve deficit.  Psychiatric:        Mood and Affect: Mood normal.        Behavior: Behavior normal.        Thought Content: Thought content normal.        Judgment: Judgment normal.     No results found.  Recent Results (from the past 2160 hours)  TSH     Status: None   Collection Time: 12/18/23 12:04 PM  Result Value Ref Range   TSH 3.01 0.35 - 5.50 uIU/mL  Lipid panel     Status: Abnormal   Collection Time: 12/18/23 12:04 PM  Result Value Ref Range   Cholesterol 185 0 - 200 mg/dL    Comment: ATP III Classification       Desirable:  < 200 mg/dL               Borderline High:  200 - 239 mg/dL          High:  > = 759 mg/dL   Triglycerides 765.9 (H) 0.0 - 149.0 mg/dL    Comment: Normal:  <849 mg/dLBorderline High:  150 - 199 mg/dL   HDL 40.09 >60.99 mg/dL   VLDL 53.1 (H) 0.0 - 59.9 mg/dL   LDL Cholesterol 78 0 - 99 mg/dL   Total CHOL/HDL Ratio 3     Comment:                Men          Women1/2 Average Risk     3.4          3.3Average Risk          5.0          4.42X Average Risk          9.6          7.13X Average Risk          15.0          11.0                       NonHDL 125.18     Comment: NOTE:   Non-HDL goal should be 30 mg/dL higher than patient's LDL goal (i.e. LDL goal of < 70 mg/dL,  would have non-HDL goal of < 100 mg/dL)  Hemoglobin J8r     Status: None   Collection Time: 12/18/23 12:04 PM  Result Value Ref Range   Hgb A1c MFr Bld 5.7 4.6 - 6.5 %    Comment: Glycemic Control Guidelines for People with Diabetes:Non Diabetic:  <6%Goal of Therapy: <7%Additional Action Suggested:  >8%   Vitamin D  1,25 dihydroxy     Status: None   Collection Time: 12/18/23 12:04 PM  Result Value Ref Range   Vitamin D  1, 25 (OH)2 Total 30 18 - 72 pg/mL   Vitamin D3 1, 25 (OH)2 30 pg/mL   Vitamin D2 1, 25 (OH)2 <8 pg/mL    Comment: (Note) Vitamin D3, 1,25(OH)2 indicates both endogenous  production and supplementation. Vitamin D2, 1,25(OH)2 is  an indicator of exogenous sources, such as diet or  supplementation. Interpretation and therapy are based on  measurement of Vitamin D , 1,25 (OH)2, Total. . This test was developed, and its analytical performance  characteristics have been determined by [employer]. It has not been cleared or approved by the  FDA. This assay has been validated pursuant to the CLIA  regulations and is used for clinical purposes. . For additional information, please refer to http://education.[facility].com/faq/FAQ199 (This link is being provided for  informational/educational purposes only.) . [facility] [address] [place] [address] [phone] Johanna Agent L. Gino, MD, PhD   CBC with Differential/Platelet     Status: Abnormal   Collection Time: 12/18/23 12:04 PM  Result Value Ref Range   WBC 4.3 4.0 - 10.5 K/uL   RBC 3.92 (L) 4.22 - 5.81 Mil/uL   Hemoglobin 12.7 (L) 13.0 - 17.0 g/dL   HCT 63.3 (L) 60.9 - 47.9 %   MCV 93.5 78.0 - 100.0 fl   MCHC 34.7 30.0 - 36.0 g/dL   RDW 85.7 88.4 - 84.4 %   Platelets 177.0 150.0 - 400.0 K/uL   Neutrophils Relative % 54.2 43.0 - 77.0 %   Lymphocytes Relative 24.8 12.0 -  46.0 %   Monocytes Relative 11.9 3.0 - 12.0 %   Eosinophils Relative 8.2 (H) 0.0 - 5.0 %   Basophils Relative 0.9 0.0 - 3.0 %   Neutro Abs 2.3 1.4 - 7.7 K/uL   Lymphs Abs 1.1 0.7 - 4.0 K/uL   Monocytes Absolute 0.5 0.1 - 1.0 K/uL   Eosinophils Absolute 0.4 0.0 - 0.7 K/uL   Basophils Absolute 0.0 0.0 - 0.1 K/uL  Comprehensive metabolic panel with GFR     Status: Abnormal   Collection Time: 12/18/23 12:04 PM  Result Value Ref Range   Sodium 139 135 - 145 mEq/L   Potassium 3.6 3.5 - 5.1 mEq/L   Chloride 104 96 - 112 mEq/L   CO2 28 19 - 32 mEq/L   Glucose, Bld 80 70 - 99 mg/dL   BUN 19 6 - 23 mg/dL   Creatinine, Ser 8.80 0.40 - 1.50 mg/dL   Total Bilirubin 0.5 0.2 - 1.2 mg/dL   Alkaline Phosphatase 77 39 - 117 U/L   AST 20 0 - 37 U/L   ALT 13 0 - 53 U/L   Total Protein 7.0 6.0 - 8.3 g/dL   Albumin 4.3 3.5 - 5.2 g/dL   GFR 44.65 (L) >39.99 mL/min    Comment: Calculated using the CKD-EPI Creatinine Equation (2021)   Calcium  9.1 8.4 - 10.5 mg/dL  PSA     Status: None   Collection Time: 12/18/23  12:04 PM  Result Value Ref Range   PSA 0.33 0.10 - 4.00 ng/mL    Comment: Test performed using [employer] PSA Assay, a parmagnetic partical, chemiluminecent immunoassay.  Iron, TIBC and Ferritin Panel     Status: None   Collection Time: 12/18/23 12:04 PM  Result Value Ref Range   Iron 68 50 - 180 mcg/dL   TIBC 699 749 - 574 mcg/dL (calc)   %SAT 23 20 - 48 % (calc)   Ferritin 218 24 - 380 ng/mL  B12 and Folate Panel     Status: None   Collection Time: 12/18/23 12:04 PM  Result Value Ref Range   Vitamin B-12 337 211 - 911 pg/mL   Folate 16.7 >5.9 ng/mL  Microalbumin / creatinine urine ratio     Status: Abnormal   Collection Time: 12/18/23 12:18 PM  Result Value Ref Range   Microalb, Ur 2.0 (H) 0.0 - 1.9 mg/dL   Creatinine,U 760.8 mg/dL   Microalb Creat Ratio 8.4 0.0 - 30.0 mg/g  Urinalysis w microscopic + reflex cultur     Status: Abnormal   Collection Time: 12/18/23 12:18 PM    Specimen: Urine  Result Value Ref Range   Color, Urine DARK YELLOW YELLOW   APPearance CLEAR CLEAR   Specific Gravity, Urine 1.022 1.001 - 1.035   pH 7.5 5.0 - 8.0   Glucose, UA NEGATIVE NEGATIVE   Bilirubin Urine NEGATIVE NEGATIVE   Ketones, ur TRACE (A) NEGATIVE   Hgb urine dipstick NEGATIVE NEGATIVE   Protein, ur NEGATIVE NEGATIVE   Nitrites, Initial NEGATIVE NEGATIVE   Leukocyte Esterase NEGATIVE NEGATIVE   WBC, UA NONE SEEN 0 - 5 /HPF   RBC / HPF NONE SEEN 0 - 2 /HPF   Squamous Epithelial / HPF NONE SEEN < OR = 5 /HPF   Bacteria, UA NONE SEEN NONE SEEN /HPF   Hyaline Cast 0-5 (A) NONE SEEN /LPF   Note      Comment: This urine was analyzed for the presence of WBC,  RBC, bacteria, casts, and other formed elements.  Only those elements seen were reported. . .   REFLEXIVE URINE CULTURE     Status: None   Collection Time: 12/18/23 12:18 PM  Result Value Ref Range   Reflexve Urine Culture      Comment: NO CULTURE INDICATED        Beverley KATHEE Hummer, MD  I,Emily Lagle,acting as a scribe for Beverley KATHEE Hummer, MD.,have documented all relevant documentation on the behalf of Beverley KATHEE Hummer, MD.  LILLETTE Beverley KATHEE Hummer, MD, have reviewed all documentation for this visit. The documentation on 01/18/2024 for the exam, diagnosis, procedures, and orders are all accurate and complete.

## 2024-01-18 NOTE — Patient Instructions (Addendum)
 It was very nice to see you today!  VISIT SUMMARY: You came in for a follow-up visit to discuss your chronic constipation, anemia, and cervical spine pain. We also reviewed your current medications and their effects.  YOUR PLAN: CHRONIC CONSTIPATION: You have chronic constipation and are currently taking Linzess , which is effective but causes urgency and increased frequency of bowel movements. -Adjust Linzess  to an as-needed basis, especially if you haven't had a bowel movement in two days. -Increase your water  intake and eat more fruits and vegetables. -Consider using Miralax  if needed between Linzess  doses.  IRON DEFICIENCY ANEMIA: You have mild iron deficiency anemia and are currently taking iron supplements and eating iron-rich foods. -Continue taking ferrous sulfate  325 mg daily. -Eat more iron-rich foods like lean beef, spinach, and turnip greens. -Take your iron supplement with orange juice to help with absorption. -We will recheck your blood work in three months to see how your anemia is doing.  CERVICAL SPINE DISORDER: You have neck pain and dysfunction, and surgery is not an option. You are waiting for further contact regarding physical therapy and injection treatments. -We provided you with contact information for the spine specialist to arrange physical therapy and injections.  Dorn KANDICE Ned MD (Attending, RESP) [phone] (Work) [address] [place], [place] [address]   HYPERTENSION: Your blood pressure is well-controlled at this visit. -Continue with your current management plan for hypertension.  Return for BP, anemia, constipation.   Take care, Dave Hummer, MD, MS   PLEASE NOTE:  If you had any lab tests, please let us  know if you have not heard back within a few days. You may see your results on mychart before we have a chance to review them but we will give you a call once they are reviewed by us .   If we ordered any referrals today, please let us   know if you have not heard from their office within the next week.   If you had any urgent prescriptions sent in today, please check with the pharmacy within an hour of our visit to make sure the prescription was transmitted appropriately.   Please try these tips to maintain a healthy lifestyle:  Eat at least 3 REAL meals and 1-2 snacks per day.  Aim for no more than 5 hours between eating.  If you eat breakfast, please do so within one hour of getting up.   Each meal should contain half fruits/vegetables, one quarter protein, and one quarter carbs (no bigger than a computer mouse)  Cut down on sweet beverages. This includes juice, soda, and sweet tea.   Drink at least 1 glass of water  with each meal and aim for at least 8 glasses per day  Exercise at least 150 minutes every week.  Iron deficiency anemia (IDA) can be managed effectively with dietary changes and iron supplementation. The American Gastroenterological Association recommends starting with oral iron supplements, such as ferrous sulfate , ferrous fumarate, or ferrous gluconate, which are cost-effective and generally well-tolerated. To improve iron absorption, take iron supplements on an empty stomach with 80 mg of vitamin C, which enhances absorption by forming a chelate with iron and reducing ferric to ferrous iron. Avoid consuming tea or coffee within an hour of taking iron, as these can inhibit absorption.  Dietary interventions are also crucial. Increase intake of heme iron sources like red meat, poultry, and fish, which are more readily absorbed than nonheme iron found in plant-based foods. For those following vegetarian or vegan diets, it is  important to consume iron-rich foods such as lentils, beans, tofu, and fortified cereals, along with vitamin C-rich foods like citrus fruits, strawberries, and bell peppers to enhance nonheme iron absorption.  Additionally, avoid consuming calcium -rich foods or supplements at the same time as  iron-rich meals, as calcium  can inhibit iron absorption. Instead, space out the intake of these nutrients throughout the day.   Vitamin B12 is essential for healthy nerve and blood cells, as well as DNA production. It's naturally found in animal products like meat, fish, eggs, and dairy. A B12 deficiency anemia occurs when your body lacks enough B12 to create healthy red blood cells, potentially leading to anemia. Symptoms can include fatigue, weakness, pale skin, shortness of breath, dizziness, numbness or tingling in hands and feet, difficulty walking, memory problems, confusion, and a sore tongue.  Treatment options include dietary changes, focusing on foods rich in B12 like meat, fish, dairy, and eggs, and some fortified foods.  However, diet alone may not correct a deficiency. And over the counter supplementation or B12 injections may be used. Regular blood tests are crucial to monitor your B12 levels and ensure treatment effectiveness.  Please follow up as scheduled so we can adjust your treatment as needed.

## 2024-01-31 ENCOUNTER — Other Ambulatory Visit (HOSPITAL_COMMUNITY): Payer: Self-pay

## 2024-03-05 ENCOUNTER — Telehealth: Payer: Self-pay

## 2024-03-05 NOTE — Telephone Encounter (Signed)
 Copied from CRM [id number]. Topic: Clinical - Lab/Test Results >> Mar 05, 2024 10:34 AM Rea ORN wrote: Reason for CRM: Mliss with Dr. Talitha office called to request pt's recent lab results. Please fax to  [phone].  If there are any additional questions, please call [phone] >> Mar 05, 2024  3:09 PM Eva FALCON wrote: Mliss was calling back to get that copy of recent lab results fax to [phone].

## 2024-03-05 NOTE — Telephone Encounter (Signed)
"  Fax sent  "

## 2024-04-21 ENCOUNTER — Ambulatory Visit: Payer: Medicare (Managed Care) | Admitting: Family Medicine
